# Patient Record
Sex: Male | Born: 1945 | Race: White | Hispanic: No | Smoking: Never smoker
Health system: Southern US, Community
[De-identification: ages and names within clinical notes are randomized; demographics above are authoritative.]

## PROBLEM LIST (undated history)

## (undated) DIAGNOSIS — I10 Essential (primary) hypertension: Secondary | ICD-10-CM

## (undated) DIAGNOSIS — E119 Type 2 diabetes mellitus without complications: Secondary | ICD-10-CM

## (undated) DIAGNOSIS — E785 Hyperlipidemia, unspecified: Secondary | ICD-10-CM

## (undated) HISTORY — PX: FRACTURE SURGERY: SHX138

## (undated) HISTORY — DX: Type 2 diabetes mellitus without complications: E11.9

## (undated) HISTORY — DX: Essential (primary) hypertension: I10

## (undated) HISTORY — DX: Hyperlipidemia, unspecified: E78.5

---

## 2003-07-23 ENCOUNTER — Ambulatory Visit (HOSPITAL_COMMUNITY): Admission: RE | Admit: 2003-07-23 | Discharge: 2003-07-23 | Payer: Self-pay | Admitting: *Deleted

## 2004-10-26 ENCOUNTER — Emergency Department (HOSPITAL_COMMUNITY): Admission: EM | Admit: 2004-10-26 | Discharge: 2004-10-26 | Payer: Self-pay | Admitting: Emergency Medicine

## 2011-09-12 ENCOUNTER — Ambulatory Visit (INDEPENDENT_AMBULATORY_CARE_PROVIDER_SITE_OTHER): Payer: Managed Care, Other (non HMO)

## 2011-09-12 DIAGNOSIS — I10 Essential (primary) hypertension: Secondary | ICD-10-CM

## 2011-09-12 DIAGNOSIS — Z23 Encounter for immunization: Secondary | ICD-10-CM

## 2011-09-12 DIAGNOSIS — IMO0001 Reserved for inherently not codable concepts without codable children: Secondary | ICD-10-CM

## 2011-09-12 DIAGNOSIS — E789 Disorder of lipoprotein metabolism, unspecified: Secondary | ICD-10-CM

## 2011-11-30 ENCOUNTER — Telehealth: Payer: Self-pay

## 2011-11-30 NOTE — Telephone Encounter (Signed)
[facility] requesting pt rx refill on caduet 10/80 Dr. Alwyn Ren prescribed.

## 2011-11-30 NOTE — Telephone Encounter (Signed)
[facility] refill of caduet 10/80 Dr Alwyn Ren prescribed.

## 2011-12-01 ENCOUNTER — Other Ambulatory Visit: Payer: Self-pay

## 2011-12-01 MED ORDER — AMLODIPINE-ATORVASTATIN 10-80 MG PO TABS: 1.0000 | ORAL_TABLET | Freq: Every day | ORAL | Status: DC

## 2011-12-01 NOTE — Telephone Encounter (Signed)
When pt calls back please ask which pharmacy he would like the caduet called into. Chart at TL box

## 2012-01-04 ENCOUNTER — Ambulatory Visit (INDEPENDENT_AMBULATORY_CARE_PROVIDER_SITE_OTHER): Payer: Managed Care, Other (non HMO) | Admitting: Family Medicine

## 2012-01-04 VITALS — BP 130/72 | HR 82 | Temp 98.1°F | Resp 16 | Ht 66.0 in | Wt 214.2 lb

## 2012-01-04 DIAGNOSIS — E785 Hyperlipidemia, unspecified: Secondary | ICD-10-CM

## 2012-01-04 DIAGNOSIS — E119 Type 2 diabetes mellitus without complications: Secondary | ICD-10-CM

## 2012-01-04 DIAGNOSIS — E789 Disorder of lipoprotein metabolism, unspecified: Secondary | ICD-10-CM

## 2012-01-04 DIAGNOSIS — E1165 Type 2 diabetes mellitus with hyperglycemia: Secondary | ICD-10-CM

## 2012-01-04 DIAGNOSIS — I10 Essential (primary) hypertension: Secondary | ICD-10-CM

## 2012-01-04 LAB — GLUCOSE, POCT (MANUAL RESULT ENTRY): POC Glucose: 77

## 2012-01-04 MED ORDER — AMLODIPINE-ATORVASTATIN 10-80 MG PO TABS: 1.0000 | ORAL_TABLET | Freq: Every day | ORAL | Status: DC

## 2012-01-04 MED ORDER — LOSARTAN POTASSIUM-HCTZ 100-12.5 MG PO TABS: 1.0000 | ORAL_TABLET | Freq: Every day | ORAL | Status: DC

## 2012-01-04 MED ORDER — SITAGLIPTIN PHOSPHATE 100 MG PO TABS: 100.0000 mg | ORAL_TABLET | Freq: Every day | ORAL | Status: DC

## 2012-01-04 MED ORDER — METFORMIN HCL 1000 MG PO TABS: 1000.0000 mg | ORAL_TABLET | Freq: Two times a day (BID) | ORAL | Status: DC

## 2012-01-04 MED ORDER — GLIPIZIDE 5 MG PO TABS: 5.0000 mg | ORAL_TABLET | Freq: Two times a day (BID) | ORAL | Status: DC

## 2012-01-04 NOTE — Progress Notes (Signed)
  Subjective:    Patient ID: Derrick Becker, male    DOB: 1946/03/03, 66 y.o.   MRN: [id number]  HPI Derrick Becker is a 66 y.o. male Here for f/u DM2, htn, hyperlipidemia.  DM2 - last A1c 7.6 09/12/11.  Home CBG's - 140's in am, lowest =79 1 month ago, not checking blood sugars recently.  Highest -170-180. symptomatic lows once in awhile in past.     Takes metformin BID, and glipizide BID., Januvia  optho - Dr. Dione Booze, last eval November 2012 - told was ok.    HTN- outside BP's 127/70.  No side effects with meds.  Hyperlipidemia - LDL 143, HDL 30, 02/19/11.  Takes Caduet 10/80 QD.  Missed last 2 days.  Eating/drinking ok.,  No new side effects of meds.  Last  po 7 hours ago.   Review of Systems  Constitutional: Negative for fatigue and unexpected weight change.  Eyes: Negative for visual disturbance.  Respiratory: Negative for cough, chest tightness and shortness of breath.   Cardiovascular: Negative for chest pain, palpitations and leg swelling.  Gastrointestinal: Negative for abdominal pain and blood in stool.  Neurological: Negative for dizziness, light-headedness and headaches.       Objective:   Physical Exam  Constitutional: He is oriented to person, place, and time. He appears well-developed and well-nourished.  HENT:  Head: Normocephalic and atraumatic.  Eyes: Pupils are equal, round, and reactive to light.  Cardiovascular: Normal rate, regular rhythm, normal heart sounds and intact distal pulses.   Pulmonary/Chest: Effort normal and breath sounds normal.  Abdominal: Soft. There is no tenderness.  Neurological: He is alert and oriented to person, place, and time.       Microfilament testing of feet normal bilaterally.  Skin: Skin is warm, dry and intact. No rash noted.  Psychiatric: He has a normal mood and affect. His behavior is normal.   . Results for orders placed in visit on 01/04/12  GLUCOSE, POCT (MANUAL RESULT ENTRY)      Component Value Range   POC Glucose  77    POCT GLYCOSYLATED HEMOGLOBIN (HGB A1C)      Component Value Range   Hemoglobin A1C 7.1           Assessment & Plan:  Derrick Becker is a 66 y.o. male 1. HTN (hypertension)  Comprehensive metabolic panel  2. Hyperlipidemia  Lipid panel, Comprehensive metabolic panel  3. Diabetes type 2, uncontrolled  POCT glucose (manual entry), Comprehensive metabolic panel, POCT glycosylated hemoglobin (Hb A1C)   Borderline control of DM, but suspect up and down readings with possible hypoglycemic episodes. Decrease glipizide to 5mg  BID with meals, discussed carrying sugar  [employer], or otherwise) if any hypoglycemic sx's. Increase metformin to 1 gram BID. Cont januvia at same dose.  If home cbg's remain below 100, o above 200 - rtc , o/w 3 month follow up.  Understanding expressed of med changes and precautions. HTN - controlled. Discussed adequate fluid intake with hx elevated creatinine. Check CMP, lipids.  Cont same dose of chol meds.  Recheck 3 months.

## 2012-01-05 LAB — COMPREHENSIVE METABOLIC PANEL
ALT: 16 U/L (ref 0–53)
AST: 17 U/L (ref 0–37)
Albumin: 4.6 g/dL (ref 3.5–5.2)
Alkaline Phosphatase: 59 U/L (ref 39–117)
Glucose, Bld: 78 mg/dL (ref 70–99)
Potassium: 4.5 mEq/L (ref 3.5–5.3)
Sodium: 141 mEq/L (ref 135–145)
Total Protein: 7.4 g/dL (ref 6.0–8.3)

## 2012-01-05 LAB — LIPID PANEL
Cholesterol: 182 mg/dL (ref 0–200)
LDL Cholesterol: 118 mg/dL — ABNORMAL HIGH (ref 0–99)
VLDL: 22 mg/dL (ref 0–40)

## 2012-07-20 ENCOUNTER — Other Ambulatory Visit: Payer: Self-pay | Admitting: Family Medicine

## 2012-08-23 ENCOUNTER — Ambulatory Visit (INDEPENDENT_AMBULATORY_CARE_PROVIDER_SITE_OTHER): Payer: Managed Care, Other (non HMO) | Admitting: Internal Medicine

## 2012-08-23 VITALS — BP 122/64 | HR 74 | Temp 98.6°F | Resp 16 | Ht 65.5 in | Wt 206.4 lb

## 2012-08-23 DIAGNOSIS — Z23 Encounter for immunization: Secondary | ICD-10-CM

## 2012-08-23 DIAGNOSIS — Z7189 Other specified counseling: Secondary | ICD-10-CM

## 2012-08-23 DIAGNOSIS — E119 Type 2 diabetes mellitus without complications: Secondary | ICD-10-CM | POA: Insufficient documentation

## 2012-08-23 DIAGNOSIS — I10 Essential (primary) hypertension: Secondary | ICD-10-CM

## 2012-08-23 LAB — POCT CBC
Granulocyte percent: 66.2 %G (ref 37–80)
HCT, POC: 42.9 % — AB (ref 43.5–53.7)
Hemoglobin: 13.2 g/dL — AB (ref 14.1–18.1)
MCV: 98.1 fL — AB (ref 80–97)
POC Granulocyte: 5 (ref 2–6.9)
POC LYMPH PERCENT: 25.3 %L (ref 10–50)
RBC: 4.37 M/uL — AB (ref 4.69–6.13)
RDW, POC: 14 %

## 2012-08-23 LAB — GLUCOSE, POCT (MANUAL RESULT ENTRY): POC Glucose: 122 mg/dl — AB (ref 70–99)

## 2012-08-23 LAB — LIPID PANEL
Cholesterol: 150 mg/dL (ref 0–200)
HDL: 36 mg/dL — ABNORMAL LOW (ref >39)

## 2012-08-23 LAB — COMPREHENSIVE METABOLIC PANEL
AST: 16 U/L (ref 0–37)
Albumin: 4.6 g/dL (ref 3.5–5.2)
BUN: 15 mg/dL (ref 6–23)
CO2: 26 mEq/L (ref 19–32)
Calcium: 9.9 mg/dL (ref 8.4–10.5)
Chloride: 102 mEq/L (ref 96–112)
Potassium: 4.8 mEq/L (ref 3.5–5.3)

## 2012-08-23 LAB — POCT URINALYSIS DIPSTICK
Bilirubin, UA: NEGATIVE
Blood, UA: NEGATIVE
Ketones, UA: NEGATIVE
Leukocytes, UA: NEGATIVE
Spec Grav, UA: 1.015
pH, UA: 5

## 2012-08-23 MED ORDER — METFORMIN HCL 1000 MG PO TABS: 1000.0000 mg | ORAL_TABLET | Freq: Two times a day (BID) | ORAL | Status: DC

## 2012-08-23 MED ORDER — LOSARTAN POTASSIUM-HCTZ 100-25 MG PO TABS: 1.0000 | ORAL_TABLET | Freq: Every day | ORAL | Status: DC

## 2012-08-23 MED ORDER — GLIPIZIDE 10 MG PO TABS: 10.0000 mg | ORAL_TABLET | Freq: Two times a day (BID) | ORAL | Status: DC

## 2012-08-23 MED ORDER — AMLODIPINE-ATORVASTATIN 10-80 MG PO TABS: 1.0000 | ORAL_TABLET | Freq: Every day | ORAL | Status: DC

## 2012-08-23 NOTE — Patient Instructions (Addendum)
Diets for Diabetes, Food Labeling  Look at food labels to help you decide how much of a product you can eat. You will want to check the amount of total carbohydrate in a serving to see how the food fits into your meal plan. In the list of ingredients, the ingredient present in the largest amount by weight must be listed first, followed by the other ingredients in descending order. STANDARD OF IDENTITY Most products have a list of ingredients. However, foods that the Food and Drug Administration (FDA) has given a standard of identity do not need a list of ingredients. A standard of identity means that a food must contain certain ingredients if it is called a particular name. Examples are mayonnaise, peanut butter, ketchup, jelly, and cheese. LABELING TERMS There are many terms found on food labels. Some of these terms have specific definitions. Some terms are regulated by the FDA, and the FDA has clearly specified how they can be used. Others are not regulated or well-defined and can be misleading and confusing. SPECIFICALLY DEFINED TERMS Nutritive Sweetener.  A sweetener that contains calories,such as table sugar or honey. Nonnutritive Sweetener.  A sweetener with few or no calories,such as saccharin, aspartame, sucralose, and cyclamate. LABELING TERMS REGULATED BY THE FDA Free.  The product contains only a tiny or small amount of fat, cholesterol, sodium, sugar, or calories. For example, a "fat-free" product will contain less than 0.5 g of fat per serving. Low.  A food described as "low" in fat, saturated fat, cholesterol, sodium, or calories could be eaten fairly often without exceeding dietary guidelines. For example, "low in fat" means no more than 3 g of fat per serving. Lean.  "Lean" and "extra lean" are U.S. Department of Agriculture Architect) terms for use on meat and poultry products. "Lean" means the product contains less than 10 g of fat, 4 g of saturated fat, and 95 mg of cholesterol  per serving. "Lean" is not as low in fat as a product labeled "low." Extra Lean.  "Extra lean" means the product contains less than 5 g of fat, 2 g of saturated fat, and 95 mg of cholesterol per serving. While "extra lean" has less fat than "lean," it is still higher in fat than a product labeled "low." Reduced, Less, Fewer.  A diet product that contains 25% less of a nutrient or calories than the regular version. For example, hot dogs might be labeled "25% less fat than our regular hot dogs." Light/Lite.  A diet product that contains  fewer calories or  the fat of the original. For example, "light in sodium" means a product with  the usual sodium. More.  One serving contains at least 10% more of the daily value of a vitamin, mineral, or fiber than usual. Good Source Of.  One serving contains 10% to 19% of the daily value for a particular vitamin, mineral, or fiber. Excellent Source Of.  One serving contains 20% or more of the daily value for a particular nutrient. Other terms used might be "high in" or "rich in." Enriched or Fortified.  The product contains added vitamins, minerals, or protein. Nutrition labeling must be used on enriched or fortified foods. Imitation.  The product has been altered so that it is lower in protein, vitamins, or minerals than the usual food,such as imitation peanut butter. Total Fat.  The number listed is the total of all fat found in a serving of the product. Under total fat, food labels must list saturated fat  and trans fat, which are associated with raising bad cholesterol and an increased risk of heart blood vessel disease. Saturated Fat.  Mainly fats from animal-based sources. Some examples are red meat, cheese, cream, whole milk, and coconut oil. Trans Fat.  Found in some fried snack foods, packaged foods, and fried restaurant foods. It is recommended you eat as close to 0 g of trans fat as possible, since it raises bad cholesterol and lowers  good cholesterol. Polyunsaturated and Monounsaturated Fats.  More healthful fats. These fats are from plant sources. Total Carbohydrate.  The number of carbohydrate grams in a serving of the product. Under total carbohydrate are listed the other carbohydrate sources, such as dietary fiber and sugars. Dietary Fiber.  A carbohydrate from plant sources. Sugars.  Sugars listed on the label contain all naturally occurring sugars as well as added sugars. LABELING TERMS NOT REGULATED BY THE FDA Sugarless.  Table sugar (sucrose) has not been added. However, the manufacturer may use another form of sugar in place of sucrose to sweeten the product. For example, sugar alcohols are used to sweeten foods. Sugar alcohols are a form of sugar but are not table sugar. If a product contains sugar alcohols in place of sucrose, it can still be labeled "sugarless." Low Salt, Salt-Free, Unsalted, No Salt, No Salt Added, Without Added Salt.  Food that is usually processed with salt has been made without salt. However, the food may contain sodium-containing additives, such as preservatives, leavening agents, or flavorings. Natural.  This term has no legal meaning. Organic.  Foods that are certified as organic have been inspected and approved by the USDA to ensure they are produced without pesticides, fertilizers containing synthetic ingredients, bioengineering, or ionizing radiation. Document Released: 09/24/2003 Document Revised: 12/14/2011 Document Reviewed: 04/11/2009 [facility] Patient Information 2013 [place], [place].  Hypoglycemia (Low Blood Sugar)  Hypoglycemia is when the glucose (sugar) in your blood is too low. Hypoglycemia can happen for many reasons. It can happen to people with or without diabetes. Hypoglycemia can develop quickly and can be a medical emergency.  CAUSES  Having hypoglycemia does not mean that you will develop diabetes. Different causes include:  Missed or delayed meals or not  enough carbohydrates eaten.  Medication overdose. This could be by accident or deliberate. If by accident, your medication may need to be adjusted or changed.  Exercise or increased activity without adjustments in carbohydrates or medications.  A nerve disorder that affects body functions like your heart rate, blood pressure and digestion (autonomic neuropathy).  A condition where the stomach muscles do not function properly (gastroparesis). Therefore, medications may not absorb properly.  The inability to recognize the signs of hypoglycemia (hypoglycemic unawareness).  Absorption of insulin  may be altered.  Alcohol consumption.  Pregnancy/menstrual cycles/postpartum. This may be due to hormones.  Certain kinds of tumors. This is very rare. SYMPTOMS   Sweating.  Hunger.  Dizziness.  Blurred vision.  Drowsiness.  Weakness.  Headache.  Rapid heart beat.  Shakiness.  Nervousness. DIAGNOSIS  Diagnosis is made by monitoring blood glucose in one or all of the following ways:  Fingerstick blood glucose monitoring.  Laboratory results. TREATMENT  If you think your blood glucose is low:  Check your blood glucose, if possible. If it is less than 70 mg/dl, take one of the following:  3-4 glucose tablets.   cup juice (prefer clear like apple).   cup "regular" soda pop.  1 cup milk.  -1 tube of glucose gel.  5-6 hard candies.  Do not over treat because your blood glucose (sugar) will only go too high.  Wait 15 minutes and recheck your blood glucose. If it is still less than 70 mg/dl (or below your target range), repeat treatment.  Eat a snack if it is more than one hour until your next meal. Sometimes, your blood glucose may go so low that you are unable to treat yourself. You may need someone to help you. You may even pass out or be unable to swallow. This may require you to get an injection of glucagon, which raises the blood glucose. HOME CARE  INSTRUCTIONS  Check blood glucose as recommended by your caregiver.  Take medication as prescribed by your caregiver.  Follow your meal plan. Do not skip meals. Eat on time.  If you are going to drink alcohol, drink it only with meals.  Check your blood glucose before driving.  Check your blood glucose before and after exercise. If you exercise longer or different than usual, be sure to check blood glucose more frequently.  Always carry treatment with you. Glucose tablets are the easiest to carry.  Always wear medical alert jewelry or carry some form of identification that states that you have diabetes. This will alert people that you have diabetes. If you have hypoglycemia, they will have a better idea on what to do. SEEK MEDICAL CARE IF:   You are having problems keeping your blood sugar at target range.  You are having frequent episodes of hypoglycemia.  You feel you might be having side effects from your medicines.  You have symptoms of an illness that is not improving after 3-4 days.  You notice a change in vision or a new problem with your vision. SEEK IMMEDIATE MEDICAL CARE IF:   You are a family member or friend of a person whose blood glucose goes below 70 mg/dl and is accompanied by:  Confusion.  A change in mental status.  The inability to swallow.  Passing out. Document Released: 09/21/2005 Document Revised: 12/14/2011 Document Reviewed: 01/18/2012 [facility] Patient Information 2013 [place], [place].

## 2012-08-23 NOTE — Progress Notes (Signed)
  Subjective:    Patient ID: Derrick Becker, male    DOB: 1945/11/12, 66 y.o.   MRN: [id number]  HPI T2D, NIDDM controlled HTN controlled    Review of Systems     Objective:   Physical Exam Sensory intact COR nl Results for orders placed in visit on 08/23/12  POCT CBC      Component Value Range   WBC 7.6  4.6 - 10.2 K/uL   Lymph, poc 1.9  0.6 - 3.4   POC LYMPH PERCENT 25.3  10 - 50 %L   MID (cbc) 0.6  0 - 0.9   POC MID % 8.5  0 - 12 %M   POC Granulocyte 5.0  2 - 6.9   Granulocyte percent 66.2  37 - 80 %G   RBC 4.37 (*) 4.69 - 6.13 M/uL   Hemoglobin 13.2 (*) 14.1 - 18.1 g/dL   HCT, POC 56.2 (*) 13.0 - 53.7 %   MCV 98.1 (*) 80 - 97 fL   MCH, POC 30.2  27 - 31.2 pg   MCHC 30.8 (*) 31.8 - 35.4 g/dL   RDW, POC 86.5     Platelet Count, POC 331  142 - 424 K/uL   MPV 7.1  0 - 99.8 fL  GLUCOSE, POCT (MANUAL RESULT ENTRY)      Component Value Range   POC Glucose 122 (*) 70 - 99 mg/dl  POCT GLYCOSYLATED HEMOGLOBIN (HGB A1C)      Component Value Range   Hemoglobin A1C 7.1    POCT URINALYSIS DIPSTICK      Component Value Range   Color, UA yellow     Clarity, UA clear     Glucose, UA neg     Bilirubin, UA neg     Ketones, UA neg     Spec Grav, UA 1.015     Blood, UA neg     pH, UA 5.0     Protein, UA neg     Urobilinogen, UA 0.2     Nitrite, UA neg     Leukocytes, UA Negative           Assessment & Plan:  Do cpe with Dr. Alwyn Ren next month RF meds 1 yr

## 2013-08-30 ENCOUNTER — Other Ambulatory Visit: Payer: Self-pay | Admitting: Internal Medicine

## 2013-09-08 ENCOUNTER — Other Ambulatory Visit: Payer: Self-pay | Admitting: Internal Medicine

## 2013-10-12 ENCOUNTER — Ambulatory Visit (INDEPENDENT_AMBULATORY_CARE_PROVIDER_SITE_OTHER): Payer: Managed Care, Other (non HMO) | Admitting: Family Medicine

## 2013-10-12 VITALS — BP 122/76 | HR 85 | Temp 98.6°F | Resp 17 | Ht 66.0 in | Wt 211.0 lb

## 2013-10-12 DIAGNOSIS — E785 Hyperlipidemia, unspecified: Secondary | ICD-10-CM

## 2013-10-12 DIAGNOSIS — I10 Essential (primary) hypertension: Secondary | ICD-10-CM

## 2013-10-12 DIAGNOSIS — E119 Type 2 diabetes mellitus without complications: Secondary | ICD-10-CM

## 2013-10-12 LAB — LIPID PANEL
Cholesterol: 135 mg/dL (ref 0–200)
HDL: 31 mg/dL — ABNORMAL LOW (ref >39)
LDL CALC: 92 mg/dL (ref 0–99)
Total CHOL/HDL Ratio: 4.4 Ratio
Triglycerides: 60 mg/dL (ref <150)
VLDL: 12 mg/dL (ref 0–40)

## 2013-10-12 LAB — COMPREHENSIVE METABOLIC PANEL
ALT: 16 U/L (ref 0–53)
AST: 20 U/L (ref 0–37)
Albumin: 4.3 g/dL (ref 3.5–5.2)
Alkaline Phosphatase: 60 U/L (ref 39–117)
BUN: 17 mg/dL (ref 6–23)
CALCIUM: 9.5 mg/dL (ref 8.4–10.5)
CHLORIDE: 102 meq/L (ref 96–112)
CO2: 25 mEq/L (ref 19–32)
Creat: 0.89 mg/dL (ref 0.50–1.35)
Glucose, Bld: 148 mg/dL — ABNORMAL HIGH (ref 70–99)
POTASSIUM: 4.4 meq/L (ref 3.5–5.3)
SODIUM: 138 meq/L (ref 135–145)
TOTAL PROTEIN: 6.9 g/dL (ref 6.0–8.3)
Total Bilirubin: 0.4 mg/dL (ref 0.3–1.2)

## 2013-10-12 LAB — POCT GLYCOSYLATED HEMOGLOBIN (HGB A1C): Hemoglobin A1C: 8.1

## 2013-10-12 MED ORDER — METFORMIN HCL 1000 MG PO TABS: ORAL_TABLET | ORAL | Status: DC

## 2013-10-12 MED ORDER — LOSARTAN POTASSIUM-HCTZ 100-25 MG PO TABS: ORAL_TABLET | ORAL | Status: DC

## 2013-10-12 MED ORDER — AMLODIPINE-ATORVASTATIN 10-80 MG PO TABS: ORAL_TABLET | ORAL | Status: DC

## 2013-10-12 MED ORDER — GLIPIZIDE 10 MG PO TABS: ORAL_TABLET | ORAL | Status: DC

## 2013-10-12 NOTE — Patient Instructions (Signed)
Continue current medications  Get regular exercise  Decrease your intake of starches, food such as breads, pastas, potatoes, rice, etc.  Try to fill up on fruits and vegetables and lean meats  Return in about June at which time I want you to call and find out when I will be here in a morning schedule. Come in early without eating, and we will do your physical and recheck some other labs then.  [place] no the rest of your lab work in a few days

## 2013-10-12 NOTE — Progress Notes (Signed)
Subjective:  Patient is here for a regular visit. It is been a long time since he is being here. He has no major new complaints. He is out of his medications in the next day or so. He continues to work regularly. He saw his eye doctor and everything was good. He has no HEENT complaints. No cardiorespiratory complaints. No GI complaints.  Objective Overweight male in no major distress. TMs normal. Throat clear. Neck supple without nodes thyromegaly. No carotid bruits. Chest clear. Heart regular without murmurs. And soft the mass or tenderness. Extremities without edema.  Assessment: Diabetes, unsatisfactory control Hypertension Hyperlipidemia  Plan: Watches diabetic diet. Get back on his medicines regularly. Return early summer, about June, for a physical examination.

## 2013-10-17 ENCOUNTER — Encounter: Payer: Self-pay | Admitting: Family Medicine

## 2014-10-02 ENCOUNTER — Other Ambulatory Visit: Payer: Self-pay | Admitting: Family Medicine

## 2014-10-17 LAB — HM DIABETES EYE EXAM

## 2014-10-18 ENCOUNTER — Other Ambulatory Visit: Payer: Self-pay | Admitting: Physician Assistant

## 2014-10-21 ENCOUNTER — Other Ambulatory Visit: Payer: Self-pay | Admitting: Family Medicine

## 2014-11-02 ENCOUNTER — Encounter: Payer: Self-pay | Admitting: Family Medicine

## 2014-12-22 ENCOUNTER — Other Ambulatory Visit: Payer: Self-pay | Admitting: Family Medicine

## 2014-12-22 ENCOUNTER — Ambulatory Visit (INDEPENDENT_AMBULATORY_CARE_PROVIDER_SITE_OTHER): Payer: Managed Care, Other (non HMO) | Admitting: Family Medicine

## 2014-12-22 VITALS — BP 162/88 | HR 82 | Temp 98.3°F | Resp 16 | Ht 66.5 in | Wt 209.2 lb

## 2014-12-22 DIAGNOSIS — E119 Type 2 diabetes mellitus without complications: Secondary | ICD-10-CM

## 2014-12-22 DIAGNOSIS — E785 Hyperlipidemia, unspecified: Secondary | ICD-10-CM | POA: Diagnosis not present

## 2014-12-22 DIAGNOSIS — I10 Essential (primary) hypertension: Secondary | ICD-10-CM | POA: Diagnosis not present

## 2014-12-22 LAB — LIPID PANEL
CHOLESTEROL: 221 mg/dL — AB (ref 0–200)
CHOLESTEROL: 221 mg/dL — AB (ref 0–200)
HDL: 37 mg/dL — AB (ref >=40)
HDL: 37 mg/dL — ABNORMAL LOW (ref >=40)
LDL CALC: 135 mg/dL — AB (ref 0–99)
LDL Cholesterol: 135 mg/dL — ABNORMAL HIGH (ref 0–99)
Total CHOL/HDL Ratio: 6 Ratio
Total CHOL/HDL Ratio: 6 Ratio
Triglycerides: 243 mg/dL — ABNORMAL HIGH (ref <150)
Triglycerides: 243 mg/dL — ABNORMAL HIGH (ref <150)
VLDL: 49 mg/dL — AB (ref 0–40)
VLDL: 49 mg/dL — ABNORMAL HIGH (ref 0–40)

## 2014-12-22 LAB — POCT GLYCOSYLATED HEMOGLOBIN (HGB A1C): HEMOGLOBIN A1C: 8.9

## 2014-12-22 LAB — GLUCOSE, POCT (MANUAL RESULT ENTRY): POC Glucose: 172 mg/dl — AB (ref 70–99)

## 2014-12-22 LAB — MICROALBUMIN, URINE: Microalb, Ur: 18.2 mg/dL — ABNORMAL HIGH (ref <2.0)

## 2014-12-22 MED ORDER — LOSARTAN POTASSIUM-HCTZ 100-25 MG PO TABS: ORAL_TABLET | ORAL | Status: DC

## 2014-12-22 MED ORDER — METFORMIN HCL 1000 MG PO TABS: ORAL_TABLET | ORAL | Status: DC

## 2014-12-22 MED ORDER — AMLODIPINE-ATORVASTATIN 10-80 MG PO TABS
1.0000 | ORAL_TABLET | Freq: Every day | ORAL | Status: DC
Start: 2014-12-22 — End: 2015-05-30

## 2014-12-22 MED ORDER — GLIPIZIDE 10 MG PO TABS: ORAL_TABLET | ORAL | Status: DC

## 2014-12-22 NOTE — Patient Instructions (Signed)
Continue current medications. It is very important that you take them faithfully.  Return in mid to late June for a follow-up check. Please call and find out what they'll be in the office and try and come in one day that I am here so I can recheck you. Ask for me when you sign in.   Return anytime if needed  Work hard on trying to get regular exercise and to eat less  Recommend reading as much as you can on the American Diabetic Association website

## 2014-12-22 NOTE — Progress Notes (Addendum)
Subjective: Patient was here for a refill of his medications. I failed to dictate noted that time, and do not recall of any other major complaints.  Objective: Normal exam. Overweight male.  Assessment: Diabetes Hypertension    Results for orders placed or performed in visit on 12/22/14  POCT glucose (manual entry)  Result Value Ref Range   POC Glucose 172 (A) 70 - 99 mg/dl  POCT glycosylated hemoglobin (Hb A1C)  Result Value Ref Range   Hemoglobin A1C 8.9    Plan: See instructions

## 2015-05-13 ENCOUNTER — Encounter: Payer: Self-pay | Admitting: *Deleted

## 2015-05-30 ENCOUNTER — Ambulatory Visit (INDEPENDENT_AMBULATORY_CARE_PROVIDER_SITE_OTHER): Payer: Managed Care, Other (non HMO) | Admitting: Family Medicine

## 2015-05-30 VITALS — BP 130/70 | HR 74 | Temp 97.8°F | Resp 16 | Ht 66.0 in | Wt 205.0 lb

## 2015-05-30 DIAGNOSIS — E119 Type 2 diabetes mellitus without complications: Secondary | ICD-10-CM | POA: Diagnosis not present

## 2015-05-30 DIAGNOSIS — G4700 Insomnia, unspecified: Secondary | ICD-10-CM

## 2015-05-30 DIAGNOSIS — R0683 Snoring: Secondary | ICD-10-CM | POA: Diagnosis not present

## 2015-05-30 DIAGNOSIS — E785 Hyperlipidemia, unspecified: Secondary | ICD-10-CM

## 2015-05-30 DIAGNOSIS — I10 Essential (primary) hypertension: Secondary | ICD-10-CM

## 2015-05-30 LAB — LIPID PANEL
CHOL/HDL RATIO: 3.1 ratio (ref <=5.0)
Cholesterol: 118 mg/dL — ABNORMAL LOW (ref 125–200)
HDL: 38 mg/dL — AB (ref >=40)
LDL CALC: 70 mg/dL (ref <130)
Triglycerides: 52 mg/dL (ref <150)
VLDL: 10 mg/dL (ref <30)

## 2015-05-30 LAB — GLUCOSE, POCT (MANUAL RESULT ENTRY): POC GLUCOSE: 122 mg/dL — AB (ref 70–99)

## 2015-05-30 LAB — COMPREHENSIVE METABOLIC PANEL
ALBUMIN: 4.5 g/dL (ref 3.6–5.1)
ALT: 15 U/L (ref 9–46)
AST: 17 U/L (ref 10–35)
Alkaline Phosphatase: 54 U/L (ref 40–115)
BUN: 16 mg/dL (ref 7–25)
CHLORIDE: 101 mmol/L (ref 98–110)
CO2: 24 mmol/L (ref 20–31)
CREATININE: 0.92 mg/dL (ref 0.70–1.25)
Calcium: 9.7 mg/dL (ref 8.6–10.3)
Glucose, Bld: 93 mg/dL (ref 65–99)
Potassium: 4.4 mmol/L (ref 3.5–5.3)
SODIUM: 138 mmol/L (ref 135–146)
Total Bilirubin: 0.4 mg/dL (ref 0.2–1.2)
Total Protein: 7 g/dL (ref 6.1–8.1)

## 2015-05-30 LAB — POCT GLYCOSYLATED HEMOGLOBIN (HGB A1C): HEMOGLOBIN A1C: 7.9

## 2015-05-30 MED ORDER — GLIPIZIDE 10 MG PO TABS: ORAL_TABLET | ORAL | Status: DC

## 2015-05-30 MED ORDER — AMLODIPINE-ATORVASTATIN 10-80 MG PO TABS: 1.0000 | ORAL_TABLET | Freq: Every day | ORAL | Status: DC

## 2015-05-30 MED ORDER — LOSARTAN POTASSIUM-HCTZ 100-25 MG PO TABS
ORAL_TABLET | ORAL | Status: DC
Start: 2015-05-30 — End: 2015-11-30

## 2015-05-30 MED ORDER — METFORMIN HCL 1000 MG PO TABS: ORAL_TABLET | ORAL | Status: DC

## 2015-05-30 NOTE — Patient Instructions (Addendum)
Insomnia Insomnia is frequent trouble falling and/or staying asleep. Insomnia can be a long term problem or a short term problem. Both are common. Insomnia can be a short term problem when the wakefulness is related to a certain stress or worry. Long term insomnia is often related to ongoing stress during waking hours and/or poor sleeping habits. Overtime, sleep deprivation itself can make the problem worse. Every little thing feels more severe because you are overtired and your ability to cope is decreased. CAUSES   Stress, anxiety, and depression.  Poor sleeping habits.  Distractions such as TV in the bedroom.  Naps close to bedtime.  Engaging in emotionally charged conversations before bed.  Technical reading before sleep.  Alcohol and other sedatives. They may make the problem worse. They can hurt normal sleep patterns and normal dream activity.  Stimulants such as caffeine for several hours prior to bedtime.  Pain syndromes and shortness of breath can cause insomnia.  Exercise late at night.  Changing time zones may cause sleeping problems (jet lag). It is sometimes helpful to have someone observe your sleeping patterns. They should look for periods of not breathing during the night (sleep apnea). They should also look to see how long those periods last. If you live alone or observers are uncertain, you can also be observed at a sleep clinic where your sleep patterns will be professionally monitored. Sleep apnea requires a checkup and treatment. Give your caregivers your medical history. Give your caregivers observations your family has made about your sleep.  SYMPTOMS   Not feeling rested in the morning.  Anxiety and restlessness at bedtime.  Difficulty falling and staying asleep. TREATMENT   Your caregiver may prescribe treatment for an underlying medical disorders. Your caregiver can give advice or help if you are using alcohol or other drugs for self-medication. Treatment  of underlying problems will usually eliminate insomnia problems.  Medications can be prescribed for short time use. They are generally not recommended for lengthy use.  Over-the-counter sleep medicines are not recommended for lengthy use. They can be habit forming.  You can promote easier sleeping by making lifestyle changes such as:  Using relaxation techniques that help with breathing and reduce muscle tension.  Exercising earlier in the day.  Changing your diet and the time of your last meal. No night time snacks.  Establish a regular time to go to bed.  Counseling can help with stressful problems and worry.  Soothing music and white noise may be helpful if there are background noises you cannot remove.  Stop tedious detailed work at least one hour before bedtime. HOME CARE INSTRUCTIONS   Keep a diary. Inform your caregiver about your progress. This includes any medication side effects. See your caregiver regularly. Take note of:  Times when you are asleep.  Times when you are awake during the night.  The quality of your sleep.  How you feel the next day. This information will help your caregiver care for you.  Get out of bed if you are still awake after 15 minutes. Read or do some quiet activity. Keep the lights down. Wait until you feel sleepy and go back to bed.  Keep regular sleeping and waking hours. Avoid naps.  Exercise regularly.  Avoid distractions at bedtime. Distractions include watching television or engaging in any intense or detailed activity like attempting to balance the household checkbook.  Develop a bedtime ritual. Keep a familiar routine of bathing, brushing your teeth, climbing into bed at the same   time each night, listening to soothing music. Routines increase the success of falling to sleep faster.  Use relaxation techniques. This can be using breathing and muscle tension release routines. It can also include visualizing peaceful scenes. You can  also help control troubling or intruding thoughts by keeping your mind occupied with boring or repetitive thoughts like the old concept of counting sheep. You can make it more creative like imagining planting one beautiful flower after another in your backyard garden.  During your day, work to eliminate stress. When this is not possible use some of the previous suggestions to help reduce the anxiety that accompanies stressful situations. MAKE SURE YOU:   Understand these instructions.  Will watch your condition.  Will get help right away if you are not doing well or get worse. Document Released: 09/18/2000 Document Revised: 12/14/2011 Document Reviewed: 10/19/2007 [facility] Patient Information 2015 [place], [place]. This information is not intended to replace advice given to you by your health care provider. Make sure you discuss any questions you have with your health care provider.   Continue current medications  Get regular exercise and try to eat less  We will plan to do a full physical next visit, around December or January. Come in one morning before you have eaten anything  You will be contacted with regard to the sleep study.  Get your flu shot at work as planned

## 2015-05-30 NOTE — Progress Notes (Signed)
Hypertension, hyperlipidemia, diabetes Subjective:  Patient ID: Derrick Becker, male    DOB: 08-14-1946  Age: 69 y.o. MRN: [id number]  Patient is here for his regular visit. He has no major acute complaints. He has been doing some walking exercise. He gets some exercise at work. He generally feels well. Cardiovascular and respiratory and GI and GU unremarkable. He doesn't sleep as well as he used to. He tosses and turns some in the night and sometimes gets up for a while. He is undergoing a divorce which is almost complete. Incompatibility. He works running a robot.   Objective:   No major distress. Overweight but he has lost few pounds. His TMs are normal. Throat clear. Neck supple without nodes. Chest clear. Heart regular without murmurs. Abdomen soft. Extremities without edema.  Assessment & Plan:   Assessment:  Diabetes type 2 Hypertension Hyperlipidemia Insomnia  Plan:  Check labs including A1c lipids, see met, glucose He will need refills on his medication  Results for orders placed or performed in visit on 05/30/15  POCT glucose (manual entry)  Result Value Ref Range   POC Glucose 122 (A) 70 - 99 mg/dl  POCT glycosylated hemoglobin (Hb A1C)  Result Value Ref Range   Hemoglobin A1C 7.9      Patient Instructions  Insomnia Insomnia is frequent trouble falling and/or staying asleep. Insomnia can be a long term problem or a short term problem. Both are common. Insomnia can be a short term problem when the wakefulness is related to a certain stress or worry. Long term insomnia is often related to ongoing stress during waking hours and/or poor sleeping habits. Overtime, sleep deprivation itself can make the problem worse. Every little thing feels more severe because you are overtired and your ability to cope is decreased. CAUSES   Stress, anxiety, and depression.  Poor sleeping habits.  Distractions such as TV in the bedroom.  Naps close to bedtime.  Engaging in  emotionally charged conversations before bed.  Technical reading before sleep.  Alcohol and other sedatives. They may make the problem worse. They can hurt normal sleep patterns and normal dream activity.  Stimulants such as caffeine for several hours prior to bedtime.  Pain syndromes and shortness of breath can cause insomnia.  Exercise late at night.  Changing time zones may cause sleeping problems (jet lag). It is sometimes helpful to have someone observe your sleeping patterns. They should look for periods of not breathing during the night (sleep apnea). They should also look to see how long those periods last. If you live alone or observers are uncertain, you can also be observed at a sleep clinic where your sleep patterns will be professionally monitored. Sleep apnea requires a checkup and treatment. Give your caregivers your medical history. Give your caregivers observations your family has made about your sleep.  SYMPTOMS   Not feeling rested in the morning.  Anxiety and restlessness at bedtime.  Difficulty falling and staying asleep. TREATMENT   Your caregiver may prescribe treatment for an underlying medical disorders. Your caregiver can give advice or help if you are using alcohol or other drugs for self-medication. Treatment of underlying problems will usually eliminate insomnia problems.  Medications can be prescribed for short time use. They are generally not recommended for lengthy use.  Over-the-counter sleep medicines are not recommended for lengthy use. They can be habit forming.  You can promote easier sleeping by making lifestyle changes such as:  Using relaxation techniques that help with breathing and  reduce muscle tension.  Exercising earlier in the day.  Changing your diet and the time of your last meal. No night time snacks.  Establish a regular time to go to bed.  Counseling can help with stressful problems and worry.  Soothing music and white noise  may be helpful if there are background noises you cannot remove.  Stop tedious detailed work at least one hour before bedtime. HOME CARE INSTRUCTIONS   Keep a diary. Inform your caregiver about your progress. This includes any medication side effects. See your caregiver regularly. Take note of:  Times when you are asleep.  Times when you are awake during the night.  The quality of your sleep.  How you feel the next day. This information will help your caregiver care for you.  Get out of bed if you are still awake after 15 minutes. Read or do some quiet activity. Keep the lights down. Wait until you feel sleepy and go back to bed.  Keep regular sleeping and waking hours. Avoid naps.  Exercise regularly.  Avoid distractions at bedtime. Distractions include watching television or engaging in any intense or detailed activity like attempting to balance the household checkbook.  Develop a bedtime ritual. Keep a familiar routine of bathing, brushing your teeth, climbing into bed at the same time each night, listening to soothing music. Routines increase the success of falling to sleep faster.  Use relaxation techniques. This can be using breathing and muscle tension release routines. It can also include visualizing peaceful scenes. You can also help control troubling or intruding thoughts by keeping your mind occupied with boring or repetitive thoughts like the old concept of counting sheep. You can make it more creative like imagining planting one beautiful flower after another in your backyard garden.  During your day, work to eliminate stress. When this is not possible use some of the previous suggestions to help reduce the anxiety that accompanies stressful situations. MAKE SURE YOU:   Understand these instructions.  Will watch your condition.  Will get help right away if you are not doing well or get worse. Document Released: 09/18/2000 Document Revised: 12/14/2011 Document  Reviewed: 10/19/2007 [facility] Patient Information 2015 [place], [place]. This information is not intended to replace advice given to you by your health care provider. Make sure you discuss any questions you have with your health care provider.   Continue current medications  Get regular exercise and try to eat less  We will plan to do a full physical next visit, around December or January. Come in one morning before you have eaten anything  You will be contacted with regard to the sleep study.  Get your flu shot at work as planned     [facility], MD 05/30/2015

## 2015-06-12 ENCOUNTER — Other Ambulatory Visit: Payer: Self-pay | Admitting: Family Medicine

## 2015-07-02 ENCOUNTER — Encounter: Payer: Self-pay | Admitting: Neurology

## 2015-07-02 ENCOUNTER — Ambulatory Visit (INDEPENDENT_AMBULATORY_CARE_PROVIDER_SITE_OTHER): Payer: Managed Care, Other (non HMO) | Admitting: Neurology

## 2015-07-02 VITALS — BP 128/62 | HR 72 | Resp 16 | Ht 66.0 in | Wt 207.0 lb

## 2015-07-02 DIAGNOSIS — E669 Obesity, unspecified: Secondary | ICD-10-CM

## 2015-07-02 DIAGNOSIS — G4719 Other hypersomnia: Secondary | ICD-10-CM | POA: Diagnosis not present

## 2015-07-02 DIAGNOSIS — R0683 Snoring: Secondary | ICD-10-CM

## 2015-07-02 NOTE — Patient Instructions (Signed)

## 2015-07-02 NOTE — Progress Notes (Signed)
Subjective:    Patient ID: Derrick Becker is a 69 y.o. male.  HPI     Star Age, MD, PhD [facility] [address] [place], [place] [address]  Dear Dr. Linna Darner,   I saw your patient, Derrick Becker, upon your kind request in my neurologic clinic today for initial consultation of his sleep disorder, in particular, concern for underlying obstructive sleep apnea. The patient is unaccompanied today. As you know, Derrick Becker is a 69 year old right-handed gentleman with an underlying medical history of type 2 diabetes, hypertension, hyperlipidemia and obesity, who reports snoring and excessive daytime somnolence with nighttime sleep disruption including difficulty maintaining sleep and difficulty going to sleep. His ESS is 16/24 and his fatigue score is 43/63. He has has at times loud snoring. Symptoms have been ongoing for months to perhaps use. He does not wake up rested. He denies morning headaches and has nocturia occasionally. He denies frank restless leg symptoms and does not know if he twitches his legs in his sleep. He lives alone. He is divorced. He has 2 grown sons. He works full-time at a [employer], [employer], 6 AM to 2:30 PM, Monday through Friday. He does not smoke. He drinks alcohol occasionally but has had heavier drinking in the past. He drinks coffee one cup per day and typically no additional caffeine, occasional sodas. Has not lost any significant amount of weight. He typically stays above the 200 pound mark and fluctuates a few pounds at a time. His brother has obstructive sleep apnea and uses a CPAP machine. This year, his son is also been diagnosed with OSA and has a CPAP machine now. His bedtime is usually around 10 or 10:30 and sometimes he has trouble going to sleep. He has never taken a sleeping pill or over-the-counter sleep aid. His rise time is typically around 4:30 AM. I reviewed your office note from 05/30/2015.  His Past Medical History  Is Significant For: Past Medical History  Diagnosis Date  . Hypertension   . Diabetes mellitus without complication   . Hyperlipemia     His Past Surgical History Is Significant For: Past Surgical History  Procedure Laterality Date  . Fracture surgery      R forearm    His Family History Is Significant For: Family History  Problem Relation Age of Onset  . Lung cancer Father   . Kidney cancer Brother     His Social History Is Significant For: Social History   Social History  . Marital Status: Divorced    Spouse Name: N/A  . Number of Children: 2  . Years of Education: [employer]   Social History Main Topics  . Smoking status: Never Smoker   . Smokeless tobacco: None  . Alcohol Use: 0.0 oz/week    0 Standard drinks or equivalent per week  . Drug Use: No  . Sexual Activity: No   Other Topics Concern  . None   Social History Narrative    His Allergies Are:  No Known Allergies:   His Current Medications Are:  Outpatient Encounter Prescriptions as of 07/02/2015  Medication Sig  . amLODipine-atorvastatin (CADUET) 10-80 MG per tablet Take 1 tablet by mouth daily.  Marland Kitchen glipiZIDE (GLUCOTROL) 10 MG tablet Take one twice daily for diabetes  . losartan-hydrochlorothiazide (HYZAAR) 100-25 MG per tablet Take one each morning for blood pressure  . metFORMIN (GLUCOPHAGE) 1000 MG tablet TAKE ONE TABLET BY MOUTH TWICE DAILY FOR DIABETES.   No facility-administered encounter  medications on file as of 07/02/2015.  :  Review of Systems:  Out of a complete 14 point review of systems, all are reviewed and negative with the exception of these symptoms as listed below:   Review of Systems  Neurological:       Has some trouble falling asleep, has trouble staying asleep, snoring, sometimes wakes up feeling tired, daytime tiredness, sleepiness, falls asleep after work when watching TV.     Objective:  Neurologic Exam  Physical Exam Physical Examination:   Filed Vitals:    07/02/15 1456  BP: 128/62  Pulse: 72  Resp: 16    General Examination: The patient is a very pleasant 69 y.o. male in no acute distress. He appears well-developed and well-nourished and well groomed. He is obese.  HEENT: Normocephalic, atraumatic, pupils are equal, round and reactive to light and accommodation. Funduscopic exam is normal with sharp disc margins noted. Extraocular tracking is good without limitation to gaze excursion or nystagmus noted. Normal smooth pursuit is noted. Hearing is grossly intact. Tympanic membranes are clear bilaterally. Face is symmetric with normal facial animation and normal facial sensation. Speech is clear with no dysarthria noted. There is no hypophonia. There is no lip, neck/head, jaw or voice tremor. Neck is supple with full range of passive and active motion. There are no carotid bruits on auscultation. Oropharynx exam reveals: mild mouth dryness, adequate dental hygiene with a partial on the top. He has moderate airway crowding based on a narrow airway entry and redundant soft palate, left side is reaching a little lower and this appears to be asymmetrical. Tonsils are absent. Uvula is small. Mallampati is class II. Tongue protrudes centrally and palate elevates symmetrically. Neck size is 17 inches. He has a Mild overbite. Nasal inspection reveals no significant nasal mucosal bogginess or redness and no septal deviation.   Chest: Clear to auscultation without wheezing, rhonchi or crackles noted.  Heart: S1+S2+0, regular and normal without murmurs, rubs or gallops noted.   Abdomen: Soft, non-tender and non-distended with normal bowel sounds appreciated on auscultation.  Extremities: There is trace pitting edema in the distal lower extremities bilaterally, around both ankles and dorsi of both feet. Pedal pulses are intact.  Skin: Warm and dry without trophic changes noted. There are no varicose veins.  Musculoskeletal: exam reveals no obvious joint  deformities, tenderness or joint swelling or erythema.   Neurologically:  Mental status: The patient is awake, alert and oriented in all 4 spheres. His immediate and remote memory, attention, language skills and fund of knowledge are appropriate. There is no evidence of aphasia, agnosia, apraxia or anomia. Speech is clear with normal prosody and enunciation. Thought process is linear. Mood is normal and affect is normal.  Cranial nerves II - XII are as described above under HEENT exam. In addition: shoulder shrug is normal with equal shoulder height noted. Motor exam: Normal bulk, strength and tone is noted. There is no drift, tremor or rebound. Romberg is negative, with the exception of mild sway. Reflexes are 2+ in the upper extremities, 1+ in both knees and trace in both ankles. Toes are flexor bilaterally. Fine motor skills and coordination: intact with normal finger taps, normal hand movements, normal rapid alternating patting, normal foot taps and normal foot agility.  Cerebellar testing: No dysmetria or intention tremor on finger to nose testing. Heel to shin is unremarkable bilaterally. There is no truncal or gait ataxia.  Sensory exam: intact to light touch, pinprick, vibration, temperature sense in the  upper and lower extremities.  Gait, station and balance: He stands easily. No veering to one side is noted. No leaning to one side is noted. Posture is age-appropriate and stance is narrow based. Gait shows normal stride length and normal pace. No problems turning are noted. He turns en bloc. Tandem walk is slightly difficult for him.   Assessment and Plan:   In summary, Derrick Becker is a very pleasant 69 y.o.-year old male with an underlying medical history of type 2 diabetes, hypertension, hyperlipidemia and obesity, who reports snoring and excessive daytime somnolence with nighttime sleep disruption including difficulty maintaining sleep and difficulty going to sleep. His history and  physical exam are indeed concerning for obstructive sleep apnea (OSA). I had a long chat with the patient about my findings and the diagnosis of OSA, its prognosis and treatment options. We talked about medical treatments, surgical interventions and non-pharmacological approaches. I explained in particular the risks and ramifications of untreated moderate to severe OSA, especially with respect to developing cardiovascular disease down the Road, including congestive heart failure, difficult to treat hypertension, cardiac arrhythmias, or stroke. Even type 2 diabetes has, in part, been linked to untreated OSA. Symptoms of untreated OSA include daytime sleepiness, memory problems, mood irritability and mood disorder such as depression and anxiety, lack of energy, as well as recurrent headaches, especially morning headaches. We talked about trying to maintain a healthy lifestyle in general, as well as the importance of weight control. I encouraged the patient to eat healthy, exercise daily and keep well hydrated, to keep a scheduled bedtime and wake time routine, to not skip any meals and eat healthy snacks in between meals. I advised the patient not to drive when feeling sleepy. I recommended the following at this time: sleep study with potential positive airway pressure titration. (We will score hypopneas at 4% and split the sleep study into diagnostic and treatment portion, if the estimated. 2 hour AHI is >20/h).   I explained the sleep test procedure to the patient and also outlined possible surgical and non-surgical treatment options of OSA, including the use of a custom-made dental device (which would require a referral to a specialist dentist or oral surgeon), upper airway surgical options, such as pillar implants, radiofrequency surgery, tongue base surgery, and UPPP (which would involve a referral to an ENT surgeon). Rarely, jaw surgery such as mandibular advancement may be considered.  I also explained the  CPAP treatment option to the patient, who indicated that he would be willing to try CPAP if the need arises. I explained the importance of being compliant with PAP treatment, not only for insurance purposes but primarily to improve His symptoms, and for the patient's long term health benefit, including to reduce His cardiovascular risks. I answered all his questions today and the patient was in agreement. I would like to see him back after the sleep study is completed and encouraged him to call with any interim questions, concerns, problems or updates.   Thank you very much for allowing me to participate in the care of this nice patient. If I can be of any further assistance to you please do not hesitate to call me at [phone].  Sincerely,   Star Age, MD, PhD

## 2015-07-25 ENCOUNTER — Other Ambulatory Visit: Payer: Self-pay

## 2015-07-25 DIAGNOSIS — E669 Obesity, unspecified: Secondary | ICD-10-CM

## 2015-07-25 DIAGNOSIS — G4719 Other hypersomnia: Secondary | ICD-10-CM

## 2015-07-25 DIAGNOSIS — R0683 Snoring: Secondary | ICD-10-CM

## 2015-08-15 ENCOUNTER — Encounter (INDEPENDENT_AMBULATORY_CARE_PROVIDER_SITE_OTHER): Payer: Managed Care, Other (non HMO) | Admitting: Neurology

## 2015-08-15 DIAGNOSIS — R0683 Snoring: Secondary | ICD-10-CM

## 2015-08-15 DIAGNOSIS — G4733 Obstructive sleep apnea (adult) (pediatric): Secondary | ICD-10-CM | POA: Diagnosis not present

## 2015-08-15 DIAGNOSIS — G4719 Other hypersomnia: Secondary | ICD-10-CM

## 2015-08-15 DIAGNOSIS — E669 Obesity, unspecified: Secondary | ICD-10-CM

## 2015-08-22 ENCOUNTER — Telehealth: Payer: Self-pay | Admitting: Neurology

## 2015-08-22 DIAGNOSIS — G4733 Obstructive sleep apnea (adult) (pediatric): Secondary | ICD-10-CM

## 2015-08-22 DIAGNOSIS — G4734 Idiopathic sleep related nonobstructive alveolar hypoventilation: Secondary | ICD-10-CM

## 2015-08-22 NOTE — Telephone Encounter (Signed)
Patient referred by Dr. Linna Darner, seen by me on 07/02/15, HST on 08/15/15, ins: [employer].  Please call and notify the patient that the recent home sleep test did suggest the diagnosis of severe obstructive sleep apnea and that I recommend treatment for this in the form of CPAP. I will request an overnight sleep study for proper titration and mask fitting. Please explain to patient and arrange for a CPAP titration study. I have placed an order in the chart. Thanks, and please route to [facility] for scheduling.   Star Age, MD, PhD [facility])

## 2015-08-26 ENCOUNTER — Telehealth: Payer: Self-pay | Admitting: Neurology

## 2015-08-26 DIAGNOSIS — G4734 Idiopathic sleep related nonobstructive alveolar hypoventilation: Secondary | ICD-10-CM

## 2015-08-26 DIAGNOSIS — G4733 Obstructive sleep apnea (adult) (pediatric): Secondary | ICD-10-CM

## 2015-08-26 NOTE — Telephone Encounter (Signed)
Derrick Becker will not cover a CPAP after doing a HST.  Auto pap is what they approve.

## 2015-08-26 NOTE — Telephone Encounter (Signed)
I left message on personal vm that study showed severe sleep apnea. I left our number to call back for further details. I stated that Dawn will call to schedule 2nd sleep study with CPAP machine. I will fax report to PCP.

## 2015-08-26 NOTE — Telephone Encounter (Signed)
I spoke to patient and he is aware of new recommendations. I will refer to Derrick Becker. I will send report to PCP. I will also send patient a letter reminding him to make f/u appt and stress importance on compliance.

## 2015-08-26 NOTE — Telephone Encounter (Signed)
Ok to order 

## 2015-08-26 NOTE — Telephone Encounter (Signed)
We can certainly start patient on autoPAP, but I would like to do an ONO about a week after autoPAP set up to monitor oxygen levels while on treatment.  He will need FU in 8-10 weeks.

## 2015-09-17 ENCOUNTER — Telehealth: Payer: Self-pay | Admitting: Neurology

## 2015-09-17 NOTE — Telephone Encounter (Signed)
Pt called sts [facility] is not in-network with [employer]. He calledCigna,it s [facility], fax [phone].Once RX rec'd they will find provider in this area

## 2015-09-18 NOTE — Telephone Encounter (Signed)
I left a message for the patient to call Derrick Becker back. I have a couple of questions about information below. [facility] should be in network unless something has changed recently. Only DME company in area that does not take [name] is [facility].

## 2015-09-18 NOTE — Telephone Encounter (Signed)
Rx, demographics, and insurance faxed into number below.

## 2015-09-18 NOTE — Telephone Encounter (Signed)
I spoke to [facility] and he was denied. I will fax Rx to number below.

## 2015-09-23 NOTE — Telephone Encounter (Signed)
I spoke to patient and advised him that we have received information that his CPAP referral was sent to [place] per insurance. He voiced understanding and I asked him to call back if he has any questions.

## 2015-10-04 ENCOUNTER — Encounter: Payer: Self-pay | Admitting: Family Medicine

## 2015-11-30 ENCOUNTER — Other Ambulatory Visit: Payer: Self-pay | Admitting: Family Medicine

## 2015-12-03 ENCOUNTER — Telehealth: Payer: Self-pay | Admitting: Neurology

## 2015-12-03 NOTE — Telephone Encounter (Signed)
I spoke to patient and he is aware. He made f/u appt in April.

## 2015-12-03 NOTE — Telephone Encounter (Signed)
I reviewed patient's AutoPap compliance data from 10/25/2015 through 11/23/2015 which is a total of 30 days during which time he uses AutoPap 27 days with percent used days greater than 4 hours at 83%, indicating very good compliance with an average usage of 4 hours and 40 minutes, residual AHI low at 0.7 per hour, 95th percentile pressure was 9.8 cm, leak acceptable with the 95th percentile at 11.9 L/m, pressure setting of 5-15 cm with EPR. Patient needs to be seen in follow-up, please arrange for follow-up appointment and call patient for it, thanks.

## 2015-12-28 ENCOUNTER — Other Ambulatory Visit: Payer: Self-pay | Admitting: Family Medicine

## 2016-01-22 ENCOUNTER — Encounter: Payer: Self-pay | Admitting: Neurology

## 2016-01-22 ENCOUNTER — Other Ambulatory Visit: Payer: Self-pay

## 2016-01-22 ENCOUNTER — Ambulatory Visit (INDEPENDENT_AMBULATORY_CARE_PROVIDER_SITE_OTHER): Payer: Managed Care, Other (non HMO) | Admitting: Neurology

## 2016-01-22 VITALS — BP 144/68 | HR 90 | Resp 18 | Ht 66.0 in | Wt 210.0 lb

## 2016-01-22 DIAGNOSIS — G4733 Obstructive sleep apnea (adult) (pediatric): Secondary | ICD-10-CM | POA: Diagnosis not present

## 2016-01-22 DIAGNOSIS — G4734 Idiopathic sleep related nonobstructive alveolar hypoventilation: Secondary | ICD-10-CM | POA: Diagnosis not present

## 2016-01-22 DIAGNOSIS — E669 Obesity, unspecified: Secondary | ICD-10-CM

## 2016-01-22 NOTE — Progress Notes (Signed)
Subjective:    Patient ID: Derrick Becker is a 70 y.o. male.  HPI     Interim history:   Derrick Becker is a 70 year old right-handed gentleman with an underlying medical history of type 2 diabetes, hypertension, hyperlipidemia and obesity, who in particular his sleep apnea after a recent home sleep test. The patient is unaccompanied today. I first met him on 07/02/2015 at the request of his primary care physician, at which time the patient reported snoring, excessive daytime somnolence, sleep disruption, difficulty going to sleep and maintaining sleep. The patient was invited back for sleep study. His insurance denied an in-house sleep study. He had a home sleep test on 08/15/2015, which showed a total AHI of 42.9 per hour, oxycodone desaturation nadir of 60%, time below 88% saturation was over 4 hours in keeping with significant nocturnal hypoxemia. Is insurance unfortunately also denied an in-house CPAP titration study which I requested. I placed him on AutoPap therapy. I also suggested we check an overnight pulse oximetry while he is on AutoPap therapy after he is established on treatment. This did not get done.  Today, 01/22/2016: I reviewed his AutoPap compliance data from 12/22/2015 through 01/20/2016 which is a total of 30 days during which time he used his machine 28 days with percent used days greater than 4 hours at 87%, indicating very good compliance with an average usage of fibers and 1 minute, residual AHI 0.9 per hour, 95th percentile pressure at 9.8 cm, leak acceptable with the 95th percentile at 16.6 L/m, pressure range of 5-15 cm with EPR.  Today, 01/22/2016: He reports doing well. He feels better rested. He has adjusted fairly well to treatment. He needed to change his nasal pillows. He feels he would not be able to tolerate the nasal mask or full facemask. He would like to get a chinstrap because he has noticed occasional mouth opening and mouth dryness. Otherwise, he is doing well.  He has gained a few pounds he admits. He does not always drink enough water. He works full-time. He has to get up at 4 AM. He lives alone.  Previously:  07/02/2015: He reports snoring and excessive daytime somnolence with nighttime sleep disruption including difficulty maintaining sleep and difficulty going to sleep. His ESS is 16/24 and his fatigue score is 43/63. He has has at times loud snoring. Symptoms have been ongoing for months to perhaps use. He does not wake up rested. He denies morning headaches and has nocturia occasionally. He denies frank restless leg symptoms and does not know if he twitches his legs in his sleep. He lives alone. He is divorced. He has 2 grown sons. He works full-time at a [employer], [employer], 6 AM to 2:30 PM, Monday through Friday. He does not smoke. He drinks alcohol occasionally but has had heavier drinking in the past. He drinks coffee one cup per day and typically no additional caffeine, occasional sodas. Has not lost any significant amount of weight. He typically stays above the 200 pound mark and fluctuates a few pounds at a time. His brother has obstructive sleep apnea and uses a CPAP machine. This year, his son is also been diagnosed with OSA and has a CPAP machine now. His bedtime is usually around 10 or 10:30 and sometimes he has trouble going to sleep. He has never taken a sleeping pill or over-the-counter sleep aid. His rise time is typically around 4:30 AM. I reviewed your office note from 05/30/2015.  His Past Medical History Is Significant For:  Past Medical History  Diagnosis Date  . Hypertension   . Diabetes mellitus without complication ([place])   . Hyperlipemia     His Past Surgical History Is Significant For: Past Surgical History  Procedure Laterality Date  . Fracture surgery      R forearm    His Family History Is Significant For: Family History  Problem Relation Age of Onset  . Lung cancer Father   . Kidney cancer Brother     His Social  History Is Significant For: Social History   Social History  . Marital Status: Divorced    Spouse Name: N/A  . Number of Children: 2  . Years of Education: [employer]   Social History Main Topics  . Smoking status: Never Smoker   . Smokeless tobacco: None  . Alcohol Use: 0.0 oz/week    0 Standard drinks or equivalent per week  . Drug Use: No  . Sexual Activity: No   Other Topics Concern  . None   Social History Narrative    His Allergies Are:  No Known Allergies:   His Current Medications Are:  Outpatient Encounter Prescriptions as of 01/22/2016  Medication Sig  . amLODipine-atorvastatin (CADUET) 10-80 MG tablet TAKE 1 TABLET BY MOUTH DAILY.  Marland Kitchen glipiZIDE (GLUCOTROL) 10 MG tablet TAKE ONE TWICE DAILY FOR DIABETES  . losartan-hydrochlorothiazide (HYZAAR) 100-25 MG tablet TAKE ONE EACH MORNING FOR BLOOD PRESSURE  . metFORMIN (GLUCOPHAGE) 1000 MG tablet TAKE ONE TABLET BY MOUTH TWICE DAILY FOR DIABETES.   No facility-administered encounter medications on file as of 01/22/2016.  :  Review of Systems:  Out of a complete 14 point review of systems, all are reviewed and negative with the exception of these symptoms as listed below:   Review of Systems  Neurological:       Patient feels like he is doing well on Auto-PAP. States that he may need a chin strap due to mouth dryness.     Objective:  Neurologic Exam  Physical Exam Physical Examination:   Filed Vitals:   01/22/16 1519  BP: 144/68  Pulse: 90  Resp: 18    General Examination: The patient is a very pleasant 70 y.o. male in no acute distress. He appears well-developed and well-nourished and well groomed. He is obese.  HEENT: Normocephalic, atraumatic, pupils are equal, round and reactive to light and accommodation. Extraocular tracking is good without limitation to gaze excursion or nystagmus noted. Normal smooth pursuit is noted. Hearing is grossly intact. Face is symmetric with normal facial animation and normal  facial sensation. Speech is clear with no dysarthria noted. There is no hypophonia. There is no lip, neck/head, jaw or voice tremor. Neck is supple with full range of passive and active motion. There are no carotid bruits on auscultation. Oropharynx exam reveals: mild mouth dryness, adequate dental hygiene with a partial on the top. He has moderate airway crowding based on a narrow airway entry and redundant soft palate, left side is reaching a little lower and this appears to be asymmetrical. Tonsils are absent. Uvula is small. Mallampati is class II. Tongue protrudes centrally and palate elevates symmetrically.  Chest: Clear to auscultation without wheezing, rhonchi or crackles noted.  Heart: S1+S2+0, regular and normal without murmurs, rubs or gallops noted.   Abdomen: Soft, non-tender and non-distended with normal bowel sounds appreciated on auscultation.  Extremities: There is trace pitting edema in the distal lower extremities bilaterally, around both ankles and dorsi of both feet. Pedal pulses are intact.  Skin:  Warm and dry without trophic changes noted. There are no varicose veins.  Musculoskeletal: exam reveals no obvious joint deformities, tenderness or joint swelling or erythema.   Neurologically:  Mental status: The patient is awake, alert and oriented in all 4 spheres. His immediate and remote memory, attention, language skills and fund of knowledge are appropriate. There is no evidence of aphasia, agnosia, apraxia or anomia. Speech is clear with normal prosody and enunciation. Thought process is linear. Mood is normal and affect is normal.  Cranial nerves II - XII are as described above under HEENT exam. In addition: shoulder shrug is normal with equal shoulder height noted. Motor exam: Normal bulk, strength and tone is noted. There is no drift, tremor or rebound. Romberg is negative, with the exception of mild sway. Reflexes are 2+ in the upper extremities, 1+ in both knees and trace  in both ankles. Fine motor skills and coordination: intact with normal finger taps, normal hand movements, normal rapid alternating patting, normal foot taps and normal foot agility.  Cerebellar testing: No dysmetria or intention tremor on finger to nose testing. Heel to shin is unremarkable bilaterally. There is no truncal or gait ataxia.  Sensory exam: intact to light touch in the upper and lower extremities.  Gait, station and balance: He stands easily. No veering to one side is noted. No leaning to one side is noted. Posture is age-appropriate and stance is narrow based. Gait shows normal stride length and normal pace. No problems turning are noted. He turns en bloc. Tandem walk is slightly difficult for him initially.   Assessment and Plan:   In summary, TRANQUILINO FISCHLER is a very pleasant 70 year old male with an underlying medical history of type 2 diabetes, hypertension, hyperlipidemia and obesity, who Presents for follow-up consultation of his severe obstructive sleep apnea as determined by home sleep test in November 2016. Unfortunately, he did not have an attended sleep study or CPAP titration study. He has been on AutoPap therapy and has been compliant with it. His residual AHI is low. His leak is acceptable. He is using nasal pillows. I would like to proceed with an overnight pulse oximetry test while he is on his AutoPap at night. His home sleep test suggested significant desaturations as low as 68% and time below 88% saturation was over 4 hours for the night. He is encouraged to pursue weight loss and good sleep hygiene and commended for his AutoPap compliance. His 95th percentile pressure is right around 10 cm, we can change this in the future to a set pressure of 10 cm CPAP. His physical exam is stable. He has gained a few pounds. We talked about sleep apnea and its risks and ramifications again today. He is encouraged to continue using his machine regularly and also take it with him when he  leaves town.  I would like to see him back in 6 months, sooner if needed. I answered all his questions today and he was in agreement.  I spent 25 minutes in total face-to-face time with the patient, more than 50% of which was spent in counseling and coordination of care, reviewing test results, reviewing medication and discussing or reviewing the diagnosis of OSA, its prognosis and treatment options.

## 2016-01-22 NOTE — Patient Instructions (Signed)
Please continue using your autoPAP regularly. While your insurance requires that you use PAP at least 4 hours each night on 70% of the nights, I recommend, that you not skip any nights and use it throughout the night if you can. Getting used to PAP and staying with the treatment long term does take time and patience and discipline. Untreated obstructive sleep apnea when it is moderate to severe can have an adverse impact on cardiovascular health and raise her risk for heart disease, arrhythmias, hypertension, congestive heart failure, stroke and diabetes. Untreated obstructive sleep apnea causes sleep disruption, nonrestorative sleep, and sleep deprivation. This can have an impact on your day to day functioning and cause daytime sleepiness and impairment of cognitive function, memory loss, mood disturbance, and problems focussing. Using PAP regularly can improve these symptoms.  We will do an overnight oxygen level test, called ONO, and your DME company will call and set this up for one night, while you also use your autoPAP machine. We will call you with the results.   Please work on good hydration with water and weight loss.

## 2016-01-25 ENCOUNTER — Other Ambulatory Visit: Payer: Self-pay | Admitting: Family Medicine

## 2016-02-17 ENCOUNTER — Encounter: Payer: Self-pay | Admitting: Family Medicine

## 2016-02-17 ENCOUNTER — Ambulatory Visit (INDEPENDENT_AMBULATORY_CARE_PROVIDER_SITE_OTHER): Payer: Managed Care, Other (non HMO) | Admitting: Family Medicine

## 2016-02-17 VITALS — BP 132/62 | HR 74 | Temp 97.8°F | Resp 16 | Ht 66.0 in | Wt 209.0 lb

## 2016-02-17 DIAGNOSIS — E119 Type 2 diabetes mellitus without complications: Secondary | ICD-10-CM

## 2016-02-17 DIAGNOSIS — I10 Essential (primary) hypertension: Secondary | ICD-10-CM | POA: Diagnosis not present

## 2016-02-17 DIAGNOSIS — E785 Hyperlipidemia, unspecified: Secondary | ICD-10-CM

## 2016-02-17 LAB — COMPLETE METABOLIC PANEL WITH GFR
ALT: 21 U/L (ref 9–46)
AST: 17 U/L (ref 10–35)
Albumin: 4.5 g/dL (ref 3.6–5.1)
Alkaline Phosphatase: 56 U/L (ref 40–115)
BILIRUBIN TOTAL: 0.5 mg/dL (ref 0.2–1.2)
BUN: 15 mg/dL (ref 7–25)
CALCIUM: 9.7 mg/dL (ref 8.6–10.3)
CHLORIDE: 101 mmol/L (ref 98–110)
CO2: 24 mmol/L (ref 20–31)
CREATININE: 0.83 mg/dL (ref 0.70–1.18)
GFR, EST NON AFRICAN AMERICAN: 89 mL/min (ref >=60)
Glucose, Bld: 179 mg/dL — ABNORMAL HIGH (ref 65–99)
Potassium: 4.2 mmol/L (ref 3.5–5.3)
Sodium: 137 mmol/L (ref 135–146)
TOTAL PROTEIN: 7.2 g/dL (ref 6.1–8.1)

## 2016-02-17 LAB — LIPID PANEL
CHOLESTEROL: 147 mg/dL (ref 125–200)
HDL: 37 mg/dL — AB (ref >=40)
LDL CALC: 97 mg/dL (ref <130)
TRIGLYCERIDES: 63 mg/dL (ref <150)
Total CHOL/HDL Ratio: 4 Ratio (ref <=5.0)
VLDL: 13 mg/dL (ref <30)

## 2016-02-17 LAB — GLUCOSE, POCT (MANUAL RESULT ENTRY): POC GLUCOSE: 193 mg/dL — AB (ref 70–99)

## 2016-02-17 LAB — POCT GLYCOSYLATED HEMOGLOBIN (HGB A1C): Hemoglobin A1C: 9

## 2016-02-17 MED ORDER — AMLODIPINE-ATORVASTATIN 10-80 MG PO TABS: 1.0000 | ORAL_TABLET | Freq: Every day | ORAL | Status: DC

## 2016-02-17 MED ORDER — SITAGLIPTIN PHOSPHATE 100 MG PO TABS: 100.0000 mg | ORAL_TABLET | Freq: Every day | ORAL | Status: DC

## 2016-02-17 MED ORDER — METFORMIN HCL 1000 MG PO TABS: ORAL_TABLET | ORAL | Status: DC

## 2016-02-17 MED ORDER — GLIPIZIDE 10 MG PO TABS: ORAL_TABLET | ORAL | Status: DC

## 2016-02-17 MED ORDER — LOSARTAN POTASSIUM-HCTZ 100-25 MG PO TABS: ORAL_TABLET | ORAL | Status: DC

## 2016-02-17 NOTE — Progress Notes (Signed)
Patient ID: Derrick Becker, male    DOB: [birth year]  Age: 70 y.o. MRN: [id number]  Chief Complaint  Patient presents with  . 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 He has diabetes and hypertension. He takes his medicines generally on a regular basis though he occasionally forgets. His sugars are running a little higher at home. Otherwise he feels well. No headaches or dizziness. No chest pains or palpitations. No GI or GU complaints. Sensory and musculoskeletal good. He continues to work on a regular basis, though he is contemplating when he might retire. He needs refills on his medications. He is now using the CPAP. He has a little discomfort in his neck, which he thinks may be from laying differently with that on.  Current allergies, medications, problem list, past/family and social histories reviewed.  Objective:  BP 132/62 mmHg  Pulse 74  Temp(Src) 97.8 F (36.6 C)  Resp 16  Ht 5\' 6"  (1.676 m)  Wt 209 lb (94.802 kg)  BMI 33.75 kg/m2  SpO2 96%  Healthy-appearing gentleman in no major acute distress. TMs normal. Eyes PERRLA. Throat clear. Neck supple without nodes. Chest clear to auscultation. Heart regular without murmur. Abdomen soft and nontender. Extremities without edema. Feet normal. Filament sensory testing normal.  Assessment & Plan:   Assessment: 1. Type 2 diabetes mellitus without complication, without long-term current use of insulin ([place])   2. Essential hypertension   3. Hyperlipidemia       Plan: Check labs.  Orders Placed This Encounter  Procedures  . COMPLETE METABOLIC PANEL WITH GFR  . Lipid panel  . POCT glycosylated hemoglobin (Hb A1C)  . POCT glucose (manual entry)    Meds ordered this encounter  Medications  . losartan-hydrochlorothiazide (HYZAAR) 100-25 MG tablet    Sig: TAKE ONE EACH MORNING FOR BLOOD PRESSURE    Dispense:  90 tablet    Refill:  3  .  metFORMIN (GLUCOPHAGE) 1000 MG tablet    Sig: TAKE ONE TABLET BY MOUTH TWICE DAILY FOR DIABETES.    Dispense:  180 tablet    Refill:  3  . amLODipine-atorvastatin (CADUET) 10-80 MG tablet    Sig: Take 1 tablet by mouth daily.    Dispense:  90 tablet    Refill:  3  . glipiZIDE (GLUCOTROL) 10 MG tablet    Sig: TAKE ONE TWICE DAILY FOR DIABETES    Dispense:  180 tablet    Refill:  3   Results for orders placed or performed in visit on 02/17/16  POCT glycosylated hemoglobin (Hb A1C)  Result Value Ref Range   Hemoglobin A1C 9.0   POCT glucose (manual entry)  Result Value Ref Range   POC Glucose 193 (A) 70 - 99 mg/dl   Will add Januvia 1 daily. The other option is to add Invokana.  Can see if that is necessary at future visits.      Patient Instructions   Continue getting regular exercise  Try to eat less, especially decreasing the carbohydrates (breads, pastas, potatoes, etc.) with a goal of losing some weight  Take your medications faithfully  When you return in 4 months ask to have a physical examination.  Return sooner if needed  In addition to your regular medicines begin taking Januvia 100 mg daily.      IF you received an x-ray today, you will receive an invoice from [facility]. Please contact  [facility] at [phone] with questions or concerns regarding your invoice.   IF you received labwork today, you will receive an invoice from [employer]. Please contact [facility] at [phone] with questions or concerns regarding your invoice.   Our billing staff will not be able to assist you with questions regarding bills from these companies.  You will be contacted with the lab results as soon as they are available. The fastest way to get your results is to activate your My Chart account. Instructions are located on the last page of this paperwork. If you have not heard from [place] regarding the results in 2 weeks, please  contact this office.          Return in about 4 months (around 06/19/2016).   HOPPER,DAVID, MD 02/17/2016

## 2016-02-17 NOTE — Patient Instructions (Addendum)
Continue getting regular exercise  Try to eat less, especially decreasing the carbohydrates (breads, pastas, potatoes, etc.) with a goal of losing some weight  Take your medications faithfully  When you return in 4 months ask to have a physical examination.  Return sooner if needed  In addition to your regular medicines begin taking Januvia 100 mg daily.      IF you received an x-ray today, you will receive an invoice from [facility]. Please contact [facility] at [phone] with questions or concerns regarding your invoice.   IF you received labwork today, you will receive an invoice from [employer]. Please contact [facility] at [phone] with questions or concerns regarding your invoice.   Our billing staff will not be able to assist you with questions regarding bills from these companies.  You will be contacted with the lab results as soon as they are available. The fastest way to get your results is to activate your My Chart account. Instructions are located on the last page of this paperwork. If you have not heard from [place] regarding the results in 2 weeks, please contact this office.

## 2016-04-13 ENCOUNTER — Ambulatory Visit (INDEPENDENT_AMBULATORY_CARE_PROVIDER_SITE_OTHER): Payer: Managed Care, Other (non HMO)

## 2016-04-13 ENCOUNTER — Ambulatory Visit (INDEPENDENT_AMBULATORY_CARE_PROVIDER_SITE_OTHER): Payer: Managed Care, Other (non HMO) | Admitting: Family Medicine

## 2016-04-13 VITALS — BP 132/84 | HR 91 | Temp 97.7°F | Resp 16 | Ht 66.0 in | Wt 209.4 lb

## 2016-04-13 DIAGNOSIS — M109 Gout, unspecified: Secondary | ICD-10-CM

## 2016-04-13 DIAGNOSIS — Z8739 Personal history of other diseases of the musculoskeletal system and connective tissue: Secondary | ICD-10-CM

## 2016-04-13 DIAGNOSIS — M25571 Pain in right ankle and joints of right foot: Secondary | ICD-10-CM | POA: Diagnosis not present

## 2016-04-13 DIAGNOSIS — Z8639 Personal history of other endocrine, nutritional and metabolic disease: Secondary | ICD-10-CM

## 2016-04-13 DIAGNOSIS — M10071 Idiopathic gout, right ankle and foot: Secondary | ICD-10-CM

## 2016-04-13 LAB — URIC ACID: Uric Acid, Serum: 6 mg/dL (ref 4.0–8.0)

## 2016-04-13 MED ORDER — COLCHICINE 0.6 MG PO TABS: ORAL_TABLET | ORAL | Status: DC

## 2016-04-13 NOTE — Progress Notes (Signed)
Patient ID: Derrick Becker, male    DOB: [birth year]  Age: 70 y.o. MRN: [id number]  Chief Complaint  Patient presents with  . Joint Swelling    ankle swelling right ankle     Subjective:   Pleasant gentleman well known to me from the past. He has a history of having gout. Used to have a lot of attacks, but has not had any long time. It has usually been his ankle that flared. Last week he was working on the roof cleaning a gutter and may have turned his ankle a little bit. However it was over the next few days it really flared up got swollen and hot on the lateral aspect of the right ankle. He has taken some ibuprofen but despite that is hurting quite a lot. He had use a crutch coming in here today  Current allergies, medications, problem list, past/family and social histories reviewed.  Objective:  BP 132/84 mmHg  Pulse 91  Temp(Src) 97.7 F (36.5 C) (Oral)  Resp 16  Ht 5\' 6"  (1.676 m)  Wt 209 lb 6.4 oz (94.983 kg)  BMI 33.81 kg/m2  SpO2 94%  Significant pain in the right ankle. Swollen in the lateral malleolar area which is mildly erythematous. It is warm to touch. Pedal pulses are good. Toes are normal.  Assessment & Plan:   Assessment: 1. Pain in joint, ankle and foot, right   2. History of gout   3. Acute gout of right ankle, unspecified cause       Plan: Suspicious for gout, but with the possibility of a little trauma I am going ahead and x-raying him.  Orders Placed This Encounter  Procedures  . DG Ankle Complete Right    Order Specific Question:  Reason for Exam (SYMPTOM  OR DIAGNOSIS REQUIRED)    Answer:  ankle pain and swelling lateral malleolus    Order Specific Question:  Preferred imaging location?    Answer:  External  . Uric acid    Meds ordered this encounter  Medications  . colchicine 0.6 MG tablet    Sig: Take 2 pills initially with some food, then 1 twice daily. When doing better can taper to once daily for a few days and then discontinue.   Dispense:  30 tablet    Refill:  1   X-ray appears to help some mild arthritic changes of the ankle but no acute bony abnormalities. Radiologist read the film and agrees.  This is all consistent with gout. Uric acid level is pending. We will treat accordingly.     Patient Instructions   Your symptoms are consistent with gout. Take colchicine 2 pills initially, then 1 twice daily for the next few days, then when doing better decrease to 1 pill daily and discontinue when symptoms are completely gone. As you know the colchicine may cause diarrhea and forced you to cut back on the medication.  You can take ibuprofen 600-800 mg 3 times daily in addition to this for a few days..  If the uric acid level comes back to her I may want to discuss putting on some allopurinol, but we will wait for that result and I will let you know that in a few days.  Return as needed or in September to follow-up on the diabetes as discussed.    IF you received an x-ray today, you will receive an invoice from [facility]. Please contact [facility] Radiology at [phone] with questions or concerns regarding your invoice.  IF you received labwork today, you will receive an invoice from [employer]. Please contact [facility] at [phone] with questions or concerns regarding your invoice.   Our billing staff will not be able to assist you with questions regarding bills from these companies.  You will be contacted with the lab results as soon as they are available. The fastest way to get your results is to activate your My Chart account. Instructions are located on the last page of this paperwork. If you have not heard from [place] regarding the results in 2 weeks, please contact this office.          Return in about 2 months (around 06/14/2016), or if symptoms worsen or fail to improve.   Adrielle Polakowski, MD 04/13/2016

## 2016-04-13 NOTE — Patient Instructions (Addendum)
Your symptoms are consistent with gout. Take colchicine 2 pills initially, then 1 twice daily for the next few days, then when doing better decrease to 1 pill daily and discontinue when symptoms are completely gone. As you know the colchicine may cause diarrhea and forced you to cut back on the medication.  You can take ibuprofen 600-800 mg 3 times daily in addition to this for a few days..  If the uric acid level comes back to her I may want to discuss putting on some allopurinol, but we will wait for that result and I will let you know that in a few days.  Return as needed or in September to follow-up on the diabetes as discussed.    IF you received an x-ray today, you will receive an invoice from [facility]. Please contact [facility]iology at [phone] with questions or concerns regarding your invoice.   IF you received labwork today, you will receive an invoice from [employer]. Please contact [facility] at [phone] with questions or concerns regarding your invoice.   Our billing staff will not be able to assist you with questions regarding bills from these companies.  You will be contacted with the lab results as soon as they are available. The fastest way to get your results is to activate your My Chart account. Instructions are located on the last page of this paperwork. If you have not heard from [place] regarding the results in 2 weeks, please contact this office.

## 2016-07-22 ENCOUNTER — Encounter: Payer: Self-pay | Admitting: Neurology

## 2016-07-23 ENCOUNTER — Ambulatory Visit: Payer: Managed Care, Other (non HMO) | Admitting: Neurology

## 2016-07-27 ENCOUNTER — Ambulatory Visit (INDEPENDENT_AMBULATORY_CARE_PROVIDER_SITE_OTHER): Payer: Managed Care, Other (non HMO) | Admitting: Neurology

## 2016-07-27 ENCOUNTER — Encounter: Payer: Self-pay | Admitting: Neurology

## 2016-07-27 VITALS — BP 132/60 | HR 82 | Resp 16 | Ht 66.0 in | Wt 209.0 lb

## 2016-07-27 DIAGNOSIS — E669 Obesity, unspecified: Secondary | ICD-10-CM

## 2016-07-27 DIAGNOSIS — Z9989 Dependence on other enabling machines and devices: Secondary | ICD-10-CM | POA: Diagnosis not present

## 2016-07-27 DIAGNOSIS — G4734 Idiopathic sleep related nonobstructive alveolar hypoventilation: Secondary | ICD-10-CM | POA: Diagnosis not present

## 2016-07-27 DIAGNOSIS — G4733 Obstructive sleep apnea (adult) (pediatric): Secondary | ICD-10-CM

## 2016-07-27 NOTE — Patient Instructions (Signed)
Please talk to your primary care doctor about alternative blood pressure and/or cholesterol medications.  Please continue using your autoPAP regularly. While your insurance requires that you use PAP at least 4 hours each night on 70% of the nights, I recommend, that you not skip any nights and use it throughout the night if you can. Getting used to PAP and staying with the treatment long term does take time and patience and discipline. Untreated obstructive sleep apnea when it is moderate to severe can have an adverse impact on cardiovascular health and raise her risk for heart disease, arrhythmias, hypertension, congestive heart failure, stroke and diabetes. Untreated obstructive sleep apnea causes sleep disruption, nonrestorative sleep, and sleep deprivation. This can have an impact on your day to day functioning and cause daytime sleepiness and impairment of cognitive function, memory loss, mood disturbance, and problems focussing. Using PAP regularly can improve these symptoms. We will do an overnight oxygen level test, called ONO, and your DME company will call and set this up for one night, while you also use your autoPAP. We will call you with the results.   I will see you back in 6 months.

## 2016-07-27 NOTE — Progress Notes (Signed)
Subjective:    Becker ID: Derrick Becker is a 70 y.o. male.  HPI     Interim history:   Derrick Becker is a 70 year old right-handed gentleman with an underlying medical history of type 2 diabetes, hypertension, hyperlipidemia and obesity, who in particular his sleep apnea after a recent home sleep test. Derrick Becker is unaccompanied today. I last saw Derrick Becker on 01/22/2016, at which time Derrick Becker was compliant with AutoPap. Unfortunately, his overnight pulse oximetry test had not been done. We re-requested that from his DME. Derrick Becker reported doing well, Derrick Becker was adjusting well to treatment, Derrick Becker felt better rested with AutoPap therapy and requested to try a chinstrap because of occasional mouth opening and mouth dryness. Otherwise Derrick Becker was doing rather well. I suggested we proceed with overnight pulse oximetry testing while Derrick Becker is on AutoPap therapy to ensure proper oxygen saturations while on treatment. Unfortunately, this has yet to be done. We called his DME company on 07/23/2016 and they had not done Derrick ONO yet.   Today, 07/27/2016: I reviewed his AutoPap compliance data from 06/23/2016 through 07/22/2016 which is a total of 30 days, during which time Derrick Becker used his machine 27 days with percent used days greater than 4 hours at 83%, indicating very good compliance with an average usage for all days of 4 hours and 28 minutes, residual AHI 1 per hour, 95th percentile of pressure at 9.3 cm, and leak acceptable for Derrick 95th percentile at 14.8 L/m, range of 5 cm to 15 cm with EPR.  Today, 07/27/2016: Derrick Becker reports doing well with autoPAP, no new complaints. Did not have Derrick ONO done, for unclear reasons, I ordered it in Derrick past, did not happen, had intermittent ankle swelling this summer, particulary on Derrick R, was on treatment for gout in Derrick past. Derrick Becker took himself off of his BP/chol combination medication, assuming it was Derrick atorvastatin in it, but I suspect, it was Derrick amlodipine in Derrick Caduet, that caused his ankles to swell,  nevertheless, is encouraged to talk to PCP about it as Derrick Becker stopped medication on his own.    Previously:  I first met Derrick Becker on 07/02/2015 at Derrick request of his primary care physician, at which time Derrick Becker reported snoring, excessive daytime somnolence, sleep disruption, difficulty going to sleep and maintaining sleep. Derrick Becker was invited back for sleep study. His insurance denied an in-house sleep study. Derrick Becker had a home sleep test on 08/15/2015, which showed a total AHI of 42.9 per hour, oxycodone desaturation nadir of 60%, time below 88% saturation was over 4 hours in keeping with significant nocturnal hypoxemia. Is insurance unfortunately also denied an in-house CPAP titration study which I requested. I placed Derrick Becker on AutoPap therapy. I also suggested we check an overnight pulse oximetry while Derrick Becker is on AutoPap therapy after Derrick Becker is established on treatment. This did not get done.   I reviewed his AutoPap compliance data from 12/22/2015 through 01/20/2016 which is a total of 30 days during which time Derrick Becker used his machine 28 days with percent used days greater than 4 hours at 87%, indicating very good compliance with an average usage of fibers and 1 minute, residual AHI 0.9 per hour, 95th percentile pressure at 9.8 cm, leak acceptable with Derrick 95th percentile at 16.6 L/m, pressure range of 5-15 cm with EPR.  07/02/2015: Derrick Becker reports snoring and excessive daytime somnolence with nighttime sleep disruption including difficulty maintaining sleep and difficulty going to sleep. His ESS is 16/24 and his fatigue score  is 43/63. Derrick Becker has has at times loud snoring. Symptoms have been ongoing for months to perhaps use. Derrick Becker does not wake up rested. Derrick Becker denies morning headaches and has nocturia occasionally. Derrick Becker denies frank restless leg symptoms and does not know if Derrick Becker twitches his legs in his sleep. Derrick Becker lives alone. Derrick Becker is divorced. Derrick Becker has 2 grown sons. Derrick Becker works full-time at a [employer], [employer], 6 AM to 2:30 PM, Monday through  Friday. Derrick Becker does not smoke. Derrick Becker drinks alcohol occasionally but has had heavier drinking in Derrick past. Derrick Becker drinks coffee one cup per day and typically no additional caffeine, occasional sodas. Has not lost any significant amount of weight. Derrick Becker typically stays above Derrick 200 pound mark and fluctuates a few pounds at a time. His brother has obstructive sleep apnea and uses a CPAP machine. This year, his son is also been diagnosed with OSA and has a CPAP machine now. His bedtime is usually around 10 or 10:30 and sometimes Derrick Becker has trouble going to sleep. Derrick Becker has never taken a sleeping pill or over-Derrick-counter sleep aid. His rise time is typically around 4:30 AM. I reviewed your office note from 05/30/2015.    His Past Medical History Is Significant For: Past Medical History:  Diagnosis Date  . Diabetes mellitus without complication ([place])   . Hyperlipemia   . Hypertension     His Past Surgical History Is Significant For: Past Surgical History:  Procedure Laterality Date  . FRACTURE SURGERY     R forearm    His Family History Is Significant For: Family History  Problem Relation Age of Onset  . Lung cancer Father   . Kidney cancer Brother     His Social History Is Significant For: Social History   Social History  . Marital status: Divorced    Spouse name: N/A  . Number of children: 2  . Years of education: College   Social History Main Topics  . Smoking status: Never Smoker  . Smokeless tobacco: None  . Alcohol use 0.0 oz/week  . Drug use: No  . Sexual activity: No   Other Topics Concern  . None   Social History Narrative  . None    His Allergies Are:  No Known Allergies:   His Current Medications Are:  Outpatient Encounter Prescriptions as of 07/27/2016  Medication Sig  . amLODipine-atorvastatin (CADUET) 10-80 MG tablet Take 1 tablet by mouth daily.  . colchicine 0.6 MG tablet Take 2 pills initially with some food, then 1 twice daily. When doing better can taper to once  daily for a few days and then discontinue.  Marland Kitchen glipiZIDE (GLUCOTROL) 10 MG tablet TAKE ONE TWICE DAILY FOR DIABETES  . losartan-hydrochlorothiazide (HYZAAR) 100-25 MG tablet TAKE ONE EACH MORNING FOR BLOOD PRESSURE  . metFORMIN (GLUCOPHAGE) 1000 MG tablet TAKE ONE TABLET BY MOUTH TWICE DAILY FOR DIABETES.  Marland Kitchen sitaGLIPtin (JANUVIA) 100 MG tablet Take 1 tablet (100 mg total) by mouth daily.   No facility-administered encounter medications on file as of 07/27/2016.   :  Review of Systems:  Out of a complete 14 point review of systems, all are reviewed and negative with Derrick exception of these symptoms as listed below:  Review of Systems  Neurological:       Becker states that Derrick Becker is doing well with Auto-PAP. Has tried some new masks. Per Becker ONO was never completed. I confirmed this with [employer].     Objective:  Neurologic Exam  Physical Exam Physical Examination:  Vitals:   07/27/16 1355  BP: 132/60  Pulse: 82  Resp: 16    General Examination: Derrick Becker is a very pleasant 70 y.o. male in no acute distress. Derrick Becker appears well-developed and well-nourished and well groomed.   HEENT: Normocephalic, atraumatic, pupils are equal, round and reactive to light and accommodation. Extraocular tracking is good without limitation to gaze excursion or nystagmus noted. Normal smooth pursuit is noted. Hearing is grossly intact. Face is symmetric with normal facial animation and normal facial sensation. Speech is clear with no dysarthria noted. There is no hypophonia. There is no lip, neck/head, jaw or voice tremor. Neck is supple with full range of passive and active motion. There are no carotid bruits on auscultation. Oropharynx exam reveals: mild mouth dryness, adequate dental hygiene with a partial on Derrick top. Derrick Becker has moderate airway crowding based on a narrow airway entry and redundant soft palate, left side is reaching a little lower and this appears to be asymmetrical. Tonsils are absent. Uvula is  small. Mallampati is class II. Tongue protrudes centrally and palate elevates symmetrically.  Chest: Clear to auscultation without wheezing, rhonchi or crackles noted.  Heart: S1+S2+0, regular and normal without murmurs, rubs or gallops noted.   Abdomen: Soft, non-tender and non-distended with normal bowel sounds appreciated on auscultation.  Extremities: There is trace pitting edema in Derrick distal lower extremities bilaterally, around both ankles, mostly R ankle.   Skin: Warm and dry without trophic changes noted. There are no varicose veins.  Musculoskeletal: exam reveals no obvious joint deformities, tenderness or joint swelling or erythema.   Neurologically:  Mental status: Derrick Becker is awake, alert and oriented in all 4 spheres. His immediate and remote memory, attention, language skills and fund of knowledge are appropriate. There is no evidence of aphasia, agnosia, apraxia or anomia. Speech is clear with normal prosody and enunciation. Thought process is linear. Mood is normal and affect is normal.  Cranial nerves II - XII are as described above under HEENT exam. In addition: shoulder shrug is normal with equal shoulder height noted. Motor exam: Normal bulk, strength and tone is noted. There is no drift, tremor or rebound. Romberg is negative, with Derrick exception of mild sway. Reflexes are 2+ in Derrick upper extremities, 1+ in both knees and trace in both ankles. Fine motor skills and coordination: intact with normal finger taps, normal hand movements, normal rapid alternating patting, normal foot taps and normal foot agility.  Cerebellar testing: No dysmetria or intention tremor on finger to nose testing. Heel to shin is unremarkable bilaterally. Sensory exam: intact to light touch in Derrick upper and lower extremities.  Gait, station and balance: Derrick Becker stands easily. No veering to one side is noted. No leaning to one side is noted. Posture is age-appropriate and stance is narrow based. Gait shows  normal stride length and normal pace. No problems turning are noted. Tandem walk is slightly difficult for Derrick Becker initially, stable.   Assessment and Plan:   In summary, RISHIT BURKHALTER is a very pleasant 70 year old male with an underlying medical history of type 2 diabetes, hypertension, hyperlipidemia and obesity, who Presents for follow-up consultation of his severe obstructive sleep apnea as determined by a HST on 08/15/2015. Unfortunately, Derrick Becker did not have an attended sleep study or CPAP titration study. Derrick Becker has been on AutoPap therapy and has been compliant with it. His residual AHI is low. His leak is acceptable. Derrick Becker is using nasal pillows. Exam is stable, with Derrick exception of  trace ankle swelling on Derrick R. Derrick Becker is advised to talk to his primary care doctor about alternative blood pressure and/or cholesterol medications, as Derrick Becker stopped his Caduet generic.   I would like to request for Derrick 3rd time an overnight pulse oximetry test while Derrick Becker is on his AutoPap at night. His home sleep test suggested significant desaturations as low as 68% and time below 88% saturation was over 4 hours for Derrick night. Derrick Becker is encouraged to pursue weight loss and good sleep hygiene and commended for his AutoPap compliance. His 95th percentile pressure is right around 10 cm, we can change this in Derrick future to a set pressure of 10 cm CPAP. We talked about sleep apnea and its risks and ramifications again today. Derrick Becker is encouraged to continue using his machine regularly. I would like to see Derrick Becker back in 6 months, sooner if needed. I answered all his questions today and Derrick Becker was in agreement.  I spent 25 minutes in total face-to-face time with Derrick Becker, more than 50% of which was spent in counseling and coordination of care, reviewing test results, reviewing medication and discussing or reviewing Derrick diagnosis of OSA, its prognosis and treatment options.

## 2016-07-28 ENCOUNTER — Other Ambulatory Visit: Payer: Self-pay | Admitting: Family Medicine

## 2016-07-28 DIAGNOSIS — E119 Type 2 diabetes mellitus without complications: Secondary | ICD-10-CM

## 2016-08-03 ENCOUNTER — Other Ambulatory Visit: Payer: Self-pay | Admitting: Family Medicine

## 2016-08-03 DIAGNOSIS — E119 Type 2 diabetes mellitus without complications: Secondary | ICD-10-CM

## 2016-08-26 ENCOUNTER — Telehealth: Payer: Self-pay | Admitting: Neurology

## 2016-08-26 NOTE — Telephone Encounter (Signed)
I reviewed the patient's ONO (overnight pulse oximetry report) from 08/06/16, while on CPAP and room air. His baseline oxygen saturation for the night was 92.2% and minimum oxygen saturation was 88%. Time below 88% saturation was 0.1 minutes. Based on the available data, it appears that the patient is adequately treated with CPAP therapy alone and does not require supplemental oxygen with CPAP therapy. Please call patient to notify.

## 2016-08-26 NOTE — Telephone Encounter (Signed)
I left message with results below. Left call back number for further questions.

## 2016-10-28 LAB — HM DIABETES EYE EXAM

## 2016-10-29 ENCOUNTER — Telehealth: Payer: Self-pay

## 2016-10-29 ENCOUNTER — Other Ambulatory Visit: Payer: Self-pay | Admitting: Family Medicine

## 2016-10-29 NOTE — Telephone Encounter (Signed)
Please call pt and schedule him an appt within a month with his new PCP

## 2016-10-29 NOTE — Telephone Encounter (Signed)
MyChart message sent to patient about scheduling an appointment per Windell Hummingbird.

## 2016-12-13 ENCOUNTER — Other Ambulatory Visit: Payer: Self-pay | Admitting: Family Medicine

## 2016-12-13 DIAGNOSIS — M109 Gout, unspecified: Secondary | ICD-10-CM

## 2017-01-25 ENCOUNTER — Ambulatory Visit (INDEPENDENT_AMBULATORY_CARE_PROVIDER_SITE_OTHER): Payer: Managed Care, Other (non HMO) | Admitting: Neurology

## 2017-01-25 ENCOUNTER — Encounter: Payer: Self-pay | Admitting: Neurology

## 2017-01-25 VITALS — BP 139/71 | HR 89 | Ht 66.0 in | Wt 207.0 lb

## 2017-01-25 DIAGNOSIS — G4733 Obstructive sleep apnea (adult) (pediatric): Secondary | ICD-10-CM

## 2017-01-25 DIAGNOSIS — Z9989 Dependence on other enabling machines and devices: Secondary | ICD-10-CM

## 2017-01-25 NOTE — Progress Notes (Signed)
Subjective:    Patient ID: Derrick Becker is a 71 y.o. male.  HPI     Interim history:   Derrick Becker is a 71 year old right-handed gentleman with an underlying medical history of type 2 diabetes, hypertension, hyperlipidemia and obesity, who presents for follow up consultation of his obstructive sleep apnea, on AutoPap therapy. The patient is unaccompanied today. I last saw him on 07/27/2016, at which time he reported doing well on AutoPap therapy. He felt better. He had very good compliance with AutoPap. He had not had his also oximetry test yet. I suggested he continue with AutoPap therapy and we order a pulse ox oximetry test overnight.  Today, 01/25/2017 (all dictated new, as well as above notes, some dictation done in note pad or Word, outside of chart, may appear as copied):  I reviewed his AutoPap compliance data from 12/21/2016 through 01/19/2017, which is a total of 30 days, during which time he used his CPAP only 19 days with percent used days greater than 4 hours at 57%, indicating suboptimal compliance with an average usage of 4 hours and 32 minutes, residual AHI 0.6 per hour, 95th percentile pressure of 9.4 cm, leak acceptable for the 95th percentile at 14.3 L/m, range of 5-15 cm with EPR. He had an overnight pulse oximetry test on 08/05/2016 which I reviewed: Average oxygen saturation was 95%, nadir was 88%. Time below 88% saturation was 0 minutes. He reports doing well. He did miss a few days last month but overall has been compliant. He has no complaints about the CPAP with the exception of mouth dryness intermittently, a couple of times he pulled the mask and the entire machine of the night stand in his sleep. He is getting ready to retire later this year. He will have some insurance change at the time. He uses nasal pillows, he would like to try a chinstrap. He is also switching to a new PCP since Dr. Linna Darner retired. His last A1c was around 9. He is working on better sugar control. He  has some fluctuation in his weight but stays within the same realm.  Previously (copied from previous notes for reference):   I saw him on 01/22/2016, at which time he was compliant with AutoPap. Unfortunately, his overnight pulse oximetry test had not been done. We re-requested that from his DME. He reported doing well, he was adjusting well to treatment, he felt better rested with AutoPap therapy and requested to try a chinstrap because of occasional mouth opening and mouth dryness. Otherwise he was doing rather well. I suggested we proceed with overnight pulse oximetry testing while he is on AutoPap therapy to ensure proper oxygen saturations while on treatment. Unfortunately, this has yet to be done. We called his DME company on 07/23/2016 and they had not done the ONO yet.    I reviewed his AutoPap compliance data from 06/23/2016 through 07/22/2016 which is a total of 30 days, during which time he used his machine 27 days with percent used days greater than 4 hours at 83%, indicating very good compliance with an average usage for all days of 4 hours and 28 minutes, residual AHI 1 per hour, 95th percentile of pressure at 9.3 cm, and leak acceptable for the 95th percentile at 14.8 L/m, range of 5 cm to 15 cm with EPR.   I first met him on 07/02/2015 at the request of his primary care physician, at which time the patient reported snoring, excessive daytime somnolence, sleep disruption, difficulty going  to sleep and maintaining sleep. The patient was invited back for sleep study. His insurance denied an in-house sleep study. He had a home sleep test on 08/15/2015, which showed a total AHI of 42.9 per hour, oxycodone desaturation nadir of 60%, time below 88% saturation was over 4 hours in keeping with significant nocturnal hypoxemia. Is insurance unfortunately also denied an in-house CPAP titration study which I requested. I placed him on AutoPap therapy. I also suggested we check an overnight pulse  oximetry while he is on AutoPap therapy after he is established on treatment. This did not get done.   I reviewed his AutoPap compliance data from 12/22/2015 through 01/20/2016 which is a total of 30 days during which time he used his machine 28 days with percent used days greater than 4 hours at 87%, indicating very good compliance with an average usage of fibers and 1 minute, residual AHI 0.9 per hour, 95th percentile pressure at 9.8 cm, leak acceptable with the 95th percentile at 16.6 L/m, pressure range of 5-15 cm with EPR.   07/02/2015: He reports snoring and excessive daytime somnolence with nighttime sleep disruption including difficulty maintaining sleep and difficulty going to sleep. His ESS is 16/24 and his fatigue score is 43/63. He has has at times loud snoring. Symptoms have been ongoing for months to perhaps use. He does not wake up rested. He denies morning headaches and has nocturia occasionally. He denies frank restless leg symptoms and does not know if he twitches his legs in his sleep. He lives alone. He is divorced. He has 2 grown sons. He works full-time at a [employer], [employer], 6 AM to 2:30 PM, Monday through Friday. He does not smoke. He drinks alcohol occasionally but has had heavier drinking in the past. He drinks coffee one cup per day and typically no additional caffeine, occasional sodas. Has not lost any significant amount of weight. He typically stays above the 200 pound mark and fluctuates a few pounds at a time. His brother has obstructive sleep apnea and uses a CPAP machine. This year, his son is also been diagnosed with OSA and has a CPAP machine now. His bedtime is usually around 10 or 10:30 and sometimes he has trouble going to sleep. He has never taken a sleeping pill or over-the-counter sleep aid. His rise time is typically around 4:30 AM. I reviewed your office note from 05/30/2015.  The patient's allergies, current medications, family history, past medical history, past  social history, past surgical history and problem list were reviewed and updated as appropriate.    His Past Medical History Is Significant For: Past Medical History:  Diagnosis Date  . Diabetes mellitus without complication ([place])   . Hyperlipemia   . Hypertension     His Past Surgical History Is Significant For: Past Surgical History:  Procedure Laterality Date  . FRACTURE SURGERY     R forearm    His Family History Is Significant For: Family History  Problem Relation Age of Onset  . Lung cancer Father   . Kidney cancer Brother     His Social History Is Significant For: Social History   Social History  . Marital status: Divorced    Spouse name: N/A  . Number of children: 2  . Years of education: College   Social History Main Topics  . Smoking status: Never Smoker  . Smokeless tobacco: Never Used  . Alcohol use 0.0 oz/week  . Drug use: No  . Sexual activity: No   Other  Topics Concern  . None   Social History Narrative  . None    His Allergies Are:  No Known Allergies:   His Current Medications Are:  Outpatient Encounter Prescriptions as of 01/25/2017  Medication Sig  . amLODipine-atorvastatin (CADUET) 10-80 MG tablet Take 1 tablet by mouth daily.  . colchicine 0.6 MG tablet Take 2 pills initially with some food, then 1 twice daily. When doing better can taper to once daily for a few days and then discontinue.  Marland Kitchen glipiZIDE (GLUCOTROL) 10 MG tablet TAKE ONE TWICE DAILY FOR DIABETES  . JANUVIA 100 MG tablet TAKE 1 TABLET (100 MG TOTAL) BY MOUTH DAILY.  Marland Kitchen losartan-hydrochlorothiazide (HYZAAR) 100-25 MG tablet TAKE ONE EACH MORNING FOR BLOOD PRESSURE  . metFORMIN (GLUCOPHAGE) 1000 MG tablet TAKE ONE TABLET BY MOUTH TWICE DAILY FOR DIABETES.  . [DISCONTINUED] JANUVIA 100 MG tablet TAKE 1 TABLET (100 MG TOTAL) BY MOUTH DAILY.  . [DISCONTINUED] JANUVIA 100 MG tablet TAKE 1 TABLET BY MOUTH DAILY   No facility-administered encounter medications on file as of  01/25/2017.   :  Review of Systems:  Out of a complete 14 point review of systems, all are reviewed and negative with the exception of these symptoms as listed below:  Review of Systems  Neurological:       Pt presents today to discuss his cpap. Pt wakes up with a dry mouth sometimes.    Objective:  Neurologic Exam  Physical Exam Physical Examination:   Vitals:   01/25/17 1513  BP: 139/71  Pulse: 89    General Examination: The patient is a very pleasant 71 y.o. male in no acute distress. He appears well-developed and well-nourished and well groomed.   HEENT: Normocephalic, atraumatic, pupils are equal, round and reactive to light and accommodation. Extraocular tracking is good without limitation to gaze excursion or nystagmus noted. Normal smooth pursuit is noted. Hearing is grossly intact. Face is symmetric with normal facial animation and normal facial sensation. Speech is clear with no dysarthria noted. There is no hypophonia. There is no lip, neck/head, jaw or voice tremor. Neck is supple with full range of passive and active motion. There are no carotid bruits on auscultation. Oropharynx exam reveals: mild mouth dryness, adequate dental hygiene and moderate airway crowding.    Chest: Clear to auscultation without wheezing, rhonchi or crackles noted.  Heart: S1+S2+0, regular and normal without murmurs, rubs or gallops noted.   Abdomen: Soft, non-tender and non-distended with normal bowel sounds appreciated on auscultation.  Extremities: There is no pitting edema in the distal lower extremities bilaterally. Pedal pulses are intact.  Skin: Warm and dry without trophic changes noted.  Musculoskeletal: exam reveals no obvious joint deformities, tenderness or joint swelling or erythema. Unremarkable surgical scars right forearm.  Neurologically:  Mental status: The patient is awake, alert and oriented in all 4 spheres. His immediate and remote memory, attention, language skills  and fund of knowledge are appropriate. There is no evidence of aphasia, agnosia, apraxia or anomia. Speech is clear with normal prosody and enunciation. Thought process is linear. Mood is normal and affect is normal.  Cranial nerves II - XII are as described above under HEENT exam. In addition: shoulder shrug is normal with equal shoulder height noted. Motor exam: Normal bulk, strength and tone is noted. There is no drift, tremor or rebound. Romberg is negative. Reflexes are 1+. Fine motor skills and coordination: intact.   Cerebellar testing: No dysmetria or intention tremor. There is no truncal or  gait ataxia.  Sensory exam: intact to light touch in the upper and lower extremities.  Gait, station and balance: He stands easily. No veering to one side is noted. No leaning to one side is noted. Posture is age-appropriate and stance is narrow based. Gait shows normal stride length and normal pace. No problems turning are noted. Tandem walk is unremarkable.   Assessment and plan:  In summary, Derrick Becker is a very pleasant 71 y.o.-year old male with an underlying medical history of type 2 diabetes, hypertension, hyperlipidemia and obesity, who Presents for follow-up consultation of his severe obstructive sleep apnea as determined by a HST on 08/15/2015. Unfortunately, he did not have an attended sleep study or CPAP titration study. He has been on AutoPap therapy and has been compliant with it. His residual AHI is low. His leak is acceptable. He is using nasal pillows. Exam is stable. He is advised to be fully compliant with AutoPap therapy and we will go ahead and request a change from auto Pap to CPAP of 10 cm. He is agreeable. I prescribed a chinstrap as well. He can try this. Generally speaking, he is tolerating the nasal pillows. He is advised to work on his diabetes control and weight management. We talked about maintaining a healthy lifestyle in general. He is also advised to follow-up with his new  primary care physician. I suggested a one-year checkup, he can see one of our nurse practitioners at the time. I answered all his questions today and he was in agreement.  I spent 20 minutes in total face-to-face time with the patient, more than 50% of which was spent in counseling and coordination of care, reviewing test results, reviewing medication and discussing or reviewing the diagnosis of OSA, its prognosis and treatment options. Pertinent laboratory and imaging test results that were available during this visit with the patient were reviewed by me and considered in my medical decision making (see chart for details).

## 2017-01-25 NOTE — Patient Instructions (Signed)
As discussed, we will change from autoPAP to a set pressure to 10 cm.  I will also prescribe a chin strap for you to try.   Please continue using your CPAP regularly. While your insurance requires that you use CPAP at least 4 hours each night on 70% of the nights, I recommend, that you not skip any nights and use it throughout the night if you can. Getting used to CPAP and staying with the treatment long term does take time and patience and discipline. Untreated obstructive sleep apnea when it is moderate to severe can have an adverse impact on cardiovascular health and raise her risk for heart disease, arrhythmias, hypertension, congestive heart failure, stroke and diabetes. Untreated obstructive sleep apnea causes sleep disruption, nonrestorative sleep, and sleep deprivation. This can have an impact on your day to day functioning and cause daytime sleepiness and impairment of cognitive function, memory loss, mood disturbance, and problems focussing. Using CPAP regularly can improve these symptoms. We can see you in 1 year, you can see one of our nurse practitioners as you are stable. I will see you after that.

## 2017-02-01 ENCOUNTER — Telehealth: Payer: Self-pay | Admitting: Neurology

## 2017-02-01 ENCOUNTER — Ambulatory Visit (INDEPENDENT_AMBULATORY_CARE_PROVIDER_SITE_OTHER): Payer: Managed Care, Other (non HMO)

## 2017-02-01 ENCOUNTER — Encounter: Payer: Self-pay | Admitting: Physician Assistant

## 2017-02-01 ENCOUNTER — Ambulatory Visit (INDEPENDENT_AMBULATORY_CARE_PROVIDER_SITE_OTHER): Payer: Managed Care, Other (non HMO) | Admitting: Physician Assistant

## 2017-02-01 VITALS — BP 131/78 | HR 107 | Temp 98.2°F | Resp 17 | Ht 66.0 in | Wt 201.0 lb

## 2017-02-01 DIAGNOSIS — M25461 Effusion, right knee: Secondary | ICD-10-CM | POA: Diagnosis not present

## 2017-02-01 DIAGNOSIS — M25561 Pain in right knee: Secondary | ICD-10-CM

## 2017-02-01 DIAGNOSIS — G4733 Obstructive sleep apnea (adult) (pediatric): Secondary | ICD-10-CM

## 2017-02-01 DIAGNOSIS — Z9989 Dependence on other enabling machines and devices: Principal | ICD-10-CM

## 2017-02-01 LAB — POCT CBC
Granulocyte percent: 69.7 %G (ref 37–80)
HEMATOCRIT: 39.7 % — AB (ref 43.5–53.7)
HEMOGLOBIN: 14 g/dL — AB (ref 14.1–18.1)
LYMPH, POC: 1.7 (ref 0.6–3.4)
MCH, POC: 32.8 pg — AB (ref 27–31.2)
MCHC: 35.3 g/dL (ref 31.8–35.4)
MCV: 92.9 fL (ref 80–97)
MID (cbc): 1 — AB (ref 0–0.9)
MPV: 6.4 fL (ref 0–99.8)
POC GRANULOCYTE: 6.1 (ref 2–6.9)
POC LYMPH %: 19.3 % (ref 10–50)
POC MID %: 11 %M (ref 0–12)
Platelet Count, POC: 298 10*3/uL (ref 142–424)
RBC: 4.28 M/uL — AB (ref 4.69–6.13)
RDW, POC: 14.2 %
WBC: 8.7 10*3/uL (ref 4.6–10.2)

## 2017-02-01 LAB — POCT SEDIMENTATION RATE: POCT SED RATE: 36 mm/h — AB (ref 0–22)

## 2017-02-01 MED ORDER — NAPROXEN 500 MG PO TABS: 500.0000 mg | ORAL_TABLET | Freq: Two times a day (BID) | ORAL | 0 refills | Status: DC

## 2017-02-01 MED ORDER — TRAMADOL HCL 50 MG PO TABS
25.0000 mg | ORAL_TABLET | Freq: Three times a day (TID) | ORAL | 0 refills | Status: DC | PRN
Start: 2017-02-01 — End: 2018-07-12

## 2017-02-01 NOTE — Patient Instructions (Addendum)
  Keep the leg elevated when possible.  Okay to try and go back to work on Thursday.  Ice the knee for 20 mins three times daily with the ace wrap in place.    IF you received an x-ray today, you will receive an invoice from [facility]. Please contact [facility] at [phone] with questions or concerns regarding your invoice.   IF you received labwork today, you will receive an invoice from [facility]. Please contact [facility] at [phone] with questions or concerns regarding your invoice.   Our billing staff will not be able to assist you with questions regarding bills from these companies.  You will be contacted with the lab results as soon as they are available. The fastest way to get your results is to activate your My Chart account. Instructions are located on the last page of this paperwork. If you have not heard from [place] regarding the results in 2 weeks, please contact this office.

## 2017-02-01 NOTE — Telephone Encounter (Signed)
Pt called said new setting is making his mouth dry all the time. He is wanting it set back to automatic. Please call

## 2017-02-01 NOTE — Progress Notes (Signed)
02/02/2017 8:34 AM   DOB: [birth year] / MRN: [id number]  SUBJECTIVE:  Derrick Becker is a 71 y.o. male presenting for right knee pain that has been sore for about 1 week and suddenly worsened on Saturday. Walking make the pain worse.  Tells me today that he is having a hard time bearing weight on the joint.  Has been taking Ibuprofen 3-4 daily with mild relief.  Has also tried colchicine. Denies any previous history of knee pain.   He has No Known Allergies.   He  has a past medical history of Diabetes mellitus without complication ([place]); Hyperlipemia; and Hypertension.    He  reports that he has never smoked. He has never used smokeless tobacco. He reports that he drinks alcohol. He reports that he does not use drugs. He  reports that he does not engage in sexual activity. The patient  has a past surgical history that includes Fracture surgery.  His family history includes Kidney cancer in his brother; Lung cancer in his father.  Review of Systems  Constitutional: Negative for chills, diaphoresis and fever.  Gastrointestinal: Negative for nausea.  Musculoskeletal: Positive for joint pain. Negative for back pain and falls.  Skin: Negative for rash.  Neurological: Negative for dizziness.    The problem list and medications were reviewed and updated by myself where necessary and exist elsewhere in the encounter.   OBJECTIVE:  BP 131/78 (BP Location: Right Arm, Patient Position: Sitting, Cuff Size: Large)   Pulse (!) 107   Temp 98.2 F (36.8 C) (Oral)   Resp 17   Ht 5\' 6"  (1.676 m)   Wt 201 lb (91.2 kg)   SpO2 94%   BMI 32.44 kg/m   Physical Exam  Constitutional: He appears well-developed. He is active and cooperative.  Non-toxic appearance.  Cardiovascular: Normal rate.   Pulmonary/Chest: Effort normal. No tachypnea.  Musculoskeletal:       Legs: Neurological: He is alert.  Skin: Skin is warm and dry. He is not diaphoretic. No pallor.  Vitals reviewed.  Lab Results    Component Value Date   CREATININE 0.83 02/17/2016     Results for orders placed or performed in visit on 02/01/17 (from the past 72 hour(s))  POCT CBC     Status: Abnormal   Collection Time: 02/01/17 11:32 AM  Result Value Ref Range   WBC 8.7 4.6 - 10.2 K/uL   Lymph, poc 1.7 0.6 - 3.4   POC LYMPH PERCENT 19.3 10 - 50 %L   MID (cbc) 1.0 (A) 0 - 0.9   POC MID % 11.0 0 - 12 %M   POC Granulocyte 6.1 2 - 6.9   Granulocyte percent 69.7 37 - 80 %G   RBC 4.28 (A) 4.69 - 6.13 M/uL   Hemoglobin 14.0 (A) 14.1 - 18.1 g/dL   HCT, POC 39.7 (A) 43.5 - 53.7 %   MCV 92.9 80 - 97 fL   MCH, POC 32.8 (A) 27 - 31.2 pg   MCHC 35.3 31.8 - 35.4 g/dL   RDW, POC 14.2 %   Platelet Count, POC 298 142 - 424 K/uL   MPV 6.4 0 - 99.8 fL  POCT SEDIMENTATION RATE     Status: Abnormal   Collection Time: 02/01/17 12:33 PM  Result Value Ref Range   POCT SED RATE 36 (A) 0 - 22 mm/hr   Lab Results  Component Value Date   HGBA1C 9.0 02/17/2016     Dg Knee Complete 4 Views Right  Result Date: 02/01/2017 CLINICAL DATA:  71 year old male with knee pain and swelling for 1 week. Knee joint effusion suspected. EXAM: RIGHT KNEE - COMPLETE 4+ VIEW COMPARISON:  None. FINDINGS: Bone mineralization is within normal limits for age. The lateral and patellofemoral compartment joint spaces are preserved. There is mild medial compartment joint space loss. There is mild patellofemoral compartment degenerative spurring. Indistinct appearance of the suprapatellar soft tissues compatible with small joint effusion. No acute osseous abnormality identified. Mild calcified femoral artery atherosclerosis. IMPRESSION: 1. Small right knee joint effusion suspected. Mild for age medial and patellofemoral compartment degeneration with no acute osseous abnormality identified. 2. Mild calcified femoral artery atherosclerosis. Electronically Signed   By: Genevie Ann M.D.   On: 02/01/2017 11:43    ASSESSMENT AND PLAN:  [name] was seen today for knee  pain.  Diagnoses and all orders for this visit:  Pain and swelling of right knee: Small effusion on rads.  Given sed rate advising two full days of colchicine.  Ace wrap and elevation for at least 48 hours with intermitent ice.  Will see him back in about two weeks if not improving.   -     DG Knee Complete 4 Views Right; Future -     POCT CBC -     POCT SEDIMENTATION RATE    The patient is advised to call or return to clinic if he does not see an improvement in symptoms, or to seek the care of the closest emergency department if he worsens with the above plan.   Philis Fendt, MHS, PA-C [facility] 02/02/2017 8:34 AM

## 2017-02-02 ENCOUNTER — Encounter: Payer: Self-pay | Admitting: Family Medicine

## 2017-02-02 ENCOUNTER — Ambulatory Visit (INDEPENDENT_AMBULATORY_CARE_PROVIDER_SITE_OTHER): Payer: Managed Care, Other (non HMO) | Admitting: Family Medicine

## 2017-02-02 VITALS — BP 128/62 | HR 90 | Temp 97.8°F | Resp 17 | Ht 66.0 in | Wt 202.0 lb

## 2017-02-02 DIAGNOSIS — Z125 Encounter for screening for malignant neoplasm of prostate: Secondary | ICD-10-CM

## 2017-02-02 DIAGNOSIS — E119 Type 2 diabetes mellitus without complications: Secondary | ICD-10-CM

## 2017-02-02 DIAGNOSIS — Z0000 Encounter for general adult medical examination without abnormal findings: Secondary | ICD-10-CM

## 2017-02-02 DIAGNOSIS — I10 Essential (primary) hypertension: Secondary | ICD-10-CM | POA: Diagnosis not present

## 2017-02-02 DIAGNOSIS — E785 Hyperlipidemia, unspecified: Secondary | ICD-10-CM | POA: Diagnosis not present

## 2017-02-02 DIAGNOSIS — Z23 Encounter for immunization: Secondary | ICD-10-CM | POA: Diagnosis not present

## 2017-02-02 MED ORDER — ATORVASTATIN CALCIUM 80 MG PO TABS: 80.0000 mg | ORAL_TABLET | Freq: Every day | ORAL | 1 refills | Status: DC

## 2017-02-02 MED ORDER — SITAGLIPTIN PHOSPHATE 100 MG PO TABS: 100.0000 mg | ORAL_TABLET | Freq: Every day | ORAL | 1 refills | Status: DC

## 2017-02-02 MED ORDER — ZOSTER VAC RECOMB ADJUVANTED 50 MCG/0.5ML IM SUSR: 0.5000 mL | Freq: Once | INTRAMUSCULAR | 1 refills | Status: AC

## 2017-02-02 MED ORDER — AMLODIPINE BESYLATE 10 MG PO TABS: 10.0000 mg | ORAL_TABLET | Freq: Every day | ORAL | 1 refills | Status: DC

## 2017-02-02 MED ORDER — LOSARTAN POTASSIUM-HCTZ 100-25 MG PO TABS: ORAL_TABLET | ORAL | 1 refills | Status: DC

## 2017-02-02 MED ORDER — GLIPIZIDE 10 MG PO TABS: ORAL_TABLET | ORAL | 1 refills | Status: DC

## 2017-02-02 MED ORDER — METFORMIN HCL 1000 MG PO TABS: ORAL_TABLET | ORAL | 1 refills | Status: DC

## 2017-02-02 NOTE — Progress Notes (Signed)
By signing my name below, I, Derrick Becker, attest that this documentation has been prepared under the direction and in the presence of Derrick Ray, MD.  Electronically Signed: Verlee Monte, Medical Scribe. 02/02/17. 9:29 AM.  Subjective:    Patient ID: Derrick Becker, male    DOB: [birth year], 71 y.o.   MRN: [id number]  HPI Chief Complaint  Patient presents with  . Annual Exam    HPI Comments: Derrick Becker is a 71 y.o. male who presents to the Primary Care at [facility] for his complete physical. He has not been seen by me since 2013. He had been followed by Dr. Linna Darner up until recently.  HTN: Takes hyzaar 100/25 mg QD, and caduet 10/80 mg QD. Pt is taking caduet every other day. Reports experiencing finger and ankle swelling as well as finger "popping/cracking" when he was taking caduet daily, but stopped experiencing these sxs after taking it every other day. Pt would like to separate his medication. Denies ankle swelling and myalgias. Lab Results  Component Value Date   CREATININE 0.83 02/17/2016   BP Readings from Last 3 Encounters:  02/02/17 (!) 159/69  02/01/17 131/78  01/25/17 139/71   HLD: He takes caduet. Pt was taking caduet just about everyday when his cholesterol was checked in 02/2016. See above for side effects and compliancy. Denies myalgias. Lab Results  Component Value Date   CHOL 147 02/17/2016   HDL 37 (L) 02/17/2016   LDLCALC 97 02/17/2016   TRIG 63 02/17/2016   CHOLHDL 4.0 02/17/2016   Lab Results  Component Value Date   ALT 21 02/17/2016   AST 17 02/17/2016   ALKPHOS 56 02/17/2016   BILITOT 0.5 02/17/2016   DM: Takes glipizide 10 mg BID, januvia 100 mg QD, and metformin 1000 mg BID. uncontrolled DM of May 2017. Januvia was added at that time, also advised diet and exercise changes. Pt ran out of [place] since Dec 2017 due to insurance difficulties - needs 3 month rx. Denies experiencing any difficulties with [place] or  his other medications. Pt's blood sugar was 144 at 6am this morning, and it's usually higher than that. Pt works late, causing him to eat late. He reports soon retirement and suspects he can control his DM after that time.  Lab Results  Component Value Date   HGBA1C 9.0 02/17/2016   Lab Results  Component Value Date   MICROALBUR 18.2 (H) 12/22/2014   Wt Readings from Last 3 Encounters:  02/02/17 202 lb (91.6 kg)  02/01/17 201 lb (91.2 kg)  01/25/17 207 lb (93.9 kg)   Gout: He was seen in July with ankle swelling; sprain vs gout flare. Treated with colchicine ad ibuprofen. He is not on daily medication. He has not had any recent flare but suspects his knee pain is gout related. Lab Results  Component Value Date   LABURIC 6.0 04/13/2016   Knee Pain: See office visit yesterday with Philis Fendt, PA-C. See above.  Cancer Screening: Prostate: Pt agrees to DRE and blood work for CA screening. No results found for: PSA Colon: Colonoscopy Dec 2016; 2 polyps removed. Repeat in 5 years.  Immunizations: Pt has not had the PNA vaccine and would like to get it today. Pt would like to check the cost of the shingles vaccine. Deferred Tdap vaccine. Immunization History  Administered Date(s) Administered  . Influenza [place] 08/23/2012   Hearing/Vision: Is followed by a ophthalmologist with last visit in Jan 2018. He had  slight signs of cataracts that's been going on for 6-7 years, not diabetic changes found. Denies hearing problems.  Visual Acuity Screening   Right eye Left eye Both eyes  Without correction: 20/25 20/40 20/25  With correction:      Dentist: Pt has a partial implant. Is not followed by a dentist.   Exercise: Pt has a active job and he works 10 days at work. Currently limited by his knee injury. Home Exercise  02/02/2017  Current Exercise Habits The patient has a physically strenous job, but has no regular exercise apart from work.  Exercise limited by: orthopedic  condition(s)   Advance Directive: Pt does not have a living will. Full code.  Depression Screening: Depression screen [facility] 2/9 02/02/2017 02/01/2017 04/13/2016 02/17/2016 05/30/2015  Decreased Interest 0 0 0 0 0  Down, Depressed, Hopeless 0 0 0 0 0  PHQ - 2 Score 0 0 0 0 0   Fall Screening: Fall Risk  02/02/2017 02/01/2017 04/13/2016 02/17/2016  Falls in the past year? No No No No   Functional Status Survey: Is the patient deaf or have difficulty hearing?: No Does the patient have difficulty seeing, even when wearing glasses/contacts?: No Does the patient have difficulty concentrating, remembering, or making decisions?: No Does the patient have difficulty walking or climbing stairs?: No Does the patient have difficulty dressing or bathing?: No Does the patient have difficulty doing errands alone such as visiting a doctor's office or shopping?: No  Patient Active Problem List   Diagnosis Date Noted  . Hyperlipidemia 10/12/2013  . Diabetes ([place]) 08/23/2012  . HTN (hypertension) 08/23/2012   Past Medical History:  Diagnosis Date  . Diabetes mellitus without complication ([place])   . Hyperlipemia   . Hypertension    Past Surgical History:  Procedure Laterality Date  . FRACTURE SURGERY     R forearm   No Known Allergies Prior to Admission medications   Medication Sig Start Date End Date Taking? Authorizing Provider  amLODipine-atorvastatin (CADUET) 10-80 MG tablet Take 1 tablet by mouth daily. 02/17/16   Posey Boyer, MD  colchicine 0.6 MG tablet Take 2 pills initially with some food, then 1 twice daily. When doing better can taper to once daily for a few days and then discontinue. 04/13/16   Posey Boyer, MD  glipiZIDE (GLUCOTROL) 10 MG tablet TAKE ONE TWICE DAILY FOR DIABETES 02/17/16   Posey Boyer, MD  JANUVIA 100 MG tablet TAKE 1 TABLET (100 MG TOTAL) BY MOUTH DAILY. 08/05/16   Jaynee Eagles, PA-C  losartan-hydrochlorothiazide (HYZAAR) 100-25 MG tablet TAKE ONE EACH MORNING FOR BLOOD  PRESSURE 02/17/16   Posey Boyer, MD  metFORMIN (GLUCOPHAGE) 1000 MG tablet TAKE ONE TABLET BY MOUTH TWICE DAILY FOR DIABETES. 02/17/16   Posey Boyer, MD  naproxen (NAPROSYN) 500 MG tablet Take 1 tablet (500 mg total) by mouth 2 (two) times daily with a meal. 02/01/17   Tereasa Coop, PA-C  traMADol (ULTRAM) 50 MG tablet Take 0.5-1 tablets (25-50 mg total) by mouth every 8 (eight) hours as needed for severe pain. 02/01/17   Tereasa Coop, PA-C   Social History   Social History  . Marital status: Divorced    Spouse name: N/A  . Number of children: 2  . Years of education: College   Occupational History  . Not on file.   Social History Main Topics  . Smoking status: Never Smoker  . Smokeless tobacco: Never Used  . Alcohol use 0.0 oz/week  .  Drug use: No  . Sexual activity: No   Other Topics Concern  . Not on file   Social History Narrative  . No narrative on file   Review of Systems  13 point ROS negative. Objective:  Physical Exam  Constitutional: He is oriented to person, place, and time. He appears well-developed and well-nourished.  HENT:  Head: Normocephalic and atraumatic.  Right Ear: External ear normal.  Left Ear: External ear normal.  Mouth/Throat: Oropharynx is clear and moist.  Eyes: Conjunctivae and EOM are normal. Pupils are equal, round, and reactive to light.  Neck: Normal range of motion. Neck supple. No thyromegaly present.  Cardiovascular: Normal rate, regular rhythm, normal heart sounds and intact distal pulses.   Pulmonary/Chest: Effort normal and breath sounds normal. No respiratory distress. He has no wheezes.  Abdominal: Soft. He exhibits no distension. There is no tenderness. Hernia confirmed negative in the right inguinal area and confirmed negative in the left inguinal area.  Genitourinary: Prostate normal.  Musculoskeletal: Normal range of motion. He exhibits no edema or tenderness.  Lymphadenopathy:    He has no cervical adenopathy.    Neurological: He is alert and oriented to person, place, and time. He has normal reflexes.  Skin: Skin is warm and dry.  Psychiatric: He has a normal mood and affect. His behavior is normal.  Vitals reviewed.    Vitals:   02/02/17 0906 02/02/17 1013  BP: (!) 159/69 128/62  Pulse: 90   Resp: 17   Temp: 97.8 F (36.6 C)   TempSrc: Oral   SpO2: 97%   Weight: 202 lb (91.6 kg)   Height: 5' 6" (1.676 m)    Body mass index is 32.6 kg/m. Assessment & Plan:    TZION WEDEL is a 71 y.o. male Medicare annual wellness visit, subsequent  -  - anticipatory guidance as below in AVS, screening labs if needed. Health maintenance items as above in HPI discussed/recommended as applicable.   - no concerning responses on depression, fall, or functional status screening. Any positive responses noted as above. Advanced directives discussed as in CHL.   Type 2 diabetes mellitus without complication, without long-term current use of insulin (HCC) - Plan: Microalbumin, urine, Hemoglobin A1c, metFORMIN (GLUCOPHAGE) 1000 MG tablet, sitaGLIPtin (JANUVIA) 100 MG tablet, glipiZIDE (GLUCOTROL) 10 MG tablet  - check A1c, continue same med doses for now - specifically Restarting Januvia.  Essential hypertension - Plan: losartan-hydrochlorothiazide (HYZAAR) 100-25 MG tablet, amLODipine (NORVASC) 10 MG tablet  - Okay on recheck. No change in med doses for now. Did separate the amlodipine from the atorvastatin as requested, but no appreciable swelling noted on exam. If persistent ankle swelling, consider lower dose of amlodipine or adjusting to other antihypertensive.  Hyperlipidemia, unspecified hyperlipidemia type - Plan: Comprehensive metabolic panel, Lipid panel, atorvastatin (LIPITOR) 80 MG tablet  - Continue Lipitor 80 mg daily, labs pending. Unlikely cause of popping in hand joints. If persistent myalgias, consider change in dose. Advised to return if popping sensation in hands returns.  Screening for  prostate cancer - Plan: PSA  - We discussed pros and cons of prostate cancer screening, and after this discussion, he chose to have screening done. PSA obtained, and no concerning findings on DRE.   Need for prophylactic vaccination against Streptococcus pneumoniae (pneumococcus) - Plan: Pneumococcal conjugate vaccine 13-valent IM  Need for shingles vaccine - Plan: Zoster Vac Recomb Adjuvanted ([place]) injection  - rx printed for pharmacy to fill if not cost prohibitive.   Meds  ordered this encounter  Medications  . metFORMIN (GLUCOPHAGE) 1000 MG tablet    Sig: TAKE ONE TABLET BY MOUTH TWICE DAILY FOR DIABETES.    Dispense:  180 tablet    Refill:  1  . losartan-hydrochlorothiazide (HYZAAR) 100-25 MG tablet    Sig: TAKE ONE EACH MORNING FOR BLOOD PRESSURE    Dispense:  90 tablet    Refill:  1  . sitaGLIPtin (JANUVIA) 100 MG tablet    Sig: Take 1 tablet (100 mg total) by mouth daily.    Dispense:  90 tablet    Refill:  1  . glipiZIDE (GLUCOTROL) 10 MG tablet    Sig: TAKE ONE TWICE DAILY FOR DIABETES    Dispense:  180 tablet    Refill:  1  . amLODipine (NORVASC) 10 MG tablet    Sig: Take 1 tablet (10 mg total) by mouth daily.    Dispense:  90 tablet    Refill:  1  . atorvastatin (LIPITOR) 80 MG tablet    Sig: Take 1 tablet (80 mg total) by mouth daily at 6 PM.    Dispense:  90 tablet    Refill:  1  . Zoster Vac Recomb Adjuvanted [facility]) injection    Sig: Inject 0.5 mLs into the muscle once. Repeat injection once in 2-6 months.    Dispense:  0.5 mL    Refill:  1   Patient Instructions   You should take amlodipine and atorvastatin each day. If you notice more ankle swelling - return to discuss med changes. Follow up in next month to discuss these meds further and to recheck blood pressure.   Restart Januvia once per day.   If return of gout in next 3 months, recommend another gout blood test.   Prevnar today, other pneumonia vaccine at next visit.   Shingles vaccine  printed - check into cost and have performed at your pharmacy.     Keeping you healthy  Get these tests  Blood pressure- Have your blood pressure checked once a year by your healthcare provider.  Normal blood pressure is 120/80  Weight- Have your body mass index (BMI) calculated to screen for obesity.  BMI is a measure of body fat based on height and weight. You can also calculate your own BMI at [email or web address].[email or web address].  Cholesterol- Have your cholesterol checked every year.  Diabetes- Have your blood sugar checked regularly if you have high blood pressure, high cholesterol, have a family history of diabetes or if you are overweight.  Screening for Colon Cancer- Colonoscopy starting at age 73.  Screening may begin sooner depending on your family history and other health conditions. Follow up colonoscopy as directed by your Gastroenterologist.  Screening for Prostate Cancer- Both blood work (PSA) and a rectal exam help screen for Prostate Cancer.  Screening begins at age 52 with African-American men and at age 24 with Caucasian men.  Screening may begin sooner depending on your family history.  Take these medicines  Aspirin- One aspirin daily can help prevent Heart disease and Stroke.  Flu shot- Every fall.  Tetanus- Every 10 years.  Zostavax- Once after the age of 42 to prevent Shingles.  Pneumonia shot- Once after the age of 64; if you are younger than 65, ask your healthcare provider if you need a Pneumonia shot.  Take these steps  Don't smoke- If you do smoke, talk to your doctor about quitting.  For tips on how to quit, go to www.smokefree.gov or call 1-800-QUIT-NOW.  Be  physically active- Exercise 5 days a week for at least 30 minutes.  If you are not already physically active start slow and gradually work up to 30 minutes of moderate physical activity.  Examples of moderate activity include walking briskly, mowing the yard, dancing, swimming, bicycling, etc.  Eat a  healthy diet- Eat a variety of healthy food such as fruits, vegetables, low fat milk, low fat cheese, yogurt, lean meant, poultry, fish, beans, tofu, etc. For more information go to www.thenutritionsource.org  Drink alcohol in moderation- Limit alcohol intake to less than two drinks a day. Never drink and drive.  Dentist- Brush and floss twice daily; visit your dentist twice a year.  Depression- Your emotional health is as important as your physical health. If you're feeling down, or losing interest in things you would normally enjoy please talk to your healthcare provider.  Eye exam- Visit your eye doctor every year.  Safe sex- If you may be exposed to a sexually transmitted infection, use a condom.  Seat belts- Seat belts can save your life; always wear one.  Smoke/Carbon Monoxide detectors- These detectors need to be installed on the appropriate level of your home.  Replace batteries at least once a year.  Skin cancer- When out in the sun, cover up and use sunscreen 15 SPF or higher.  Violence- If anyone is threatening you, please tell your healthcare provider.  Living Will/ Health care power of attorney- Speak with your healthcare provider and family. Diabetes Mellitus and Standards of Medical Care Managing diabetes (diabetes mellitus) can be complicated. Your diabetes treatment may be managed by a team of health care providers, including:  A diet and nutrition specialist (registered dietitian).  A nurse.  A certified diabetes educator (CDE).  A diabetes specialist (endocrinologist).  An eye doctor.  A primary care provider.  A dentist. Your health care providers follow a schedule in order to help you get the best quality of care. The following schedule is a general guideline for your diabetes management plan. Your health care providers may also give you more specific instructions. HbA1c ( hemoglobin A1c) test This test provides information about blood sugar (glucose)  control over the previous 2-3 months. It is used to check whether your diabetes management plan needs to be adjusted.  If you are meeting your treatment goals, this test is done at least 2 times a year.  If you are not meeting treatment goals or if your treatment goals have changed, this test is done 4 times a year. Blood pressure test  This test is done at every routine medical visit. For most people, the goal is less than 130/80. Ask your health care provider what your goal blood pressure should be. Dental and eye exams  Visit your dentist two times a year.  If you have type 1 diabetes, get an eye exam 3-5 years after you are diagnosed, and then once a year after your first exam.  If you were diagnosed with type 1 diabetes as a child, get an eye exam when you are age 32 or older and have had diabetes for 3-5 years. After the first exam, you should get an eye exam once a year.  If you have type 2 diabetes, have an eye exam as soon as you are diagnosed, and then once a year after your first exam. Foot care exam  Visual foot exams are done at every routine medical visit. The exams check for cuts, bruises, redness, blisters, sores, or other problems with the  feet.  A complete foot exam is done by your health care provider once a year. This exam includes an inspection of the structure and skin of your feet, and a check of the pulses and sensation in your feet.  Type 1 diabetes: Get your first exam 3-5 years after diagnosis.  Type 2 diabetes: Get your first exam as soon as you are diagnosed.  Check your feet every day for cuts, bruises, redness, blisters, or sores. If you have any of these or other problems that are not healing, contact your health care provider. Kidney function test ( urine microalbumin)  This test is done once a year.  Type 1 diabetes: Get your first test 5 years after diagnosis.  Type 2 diabetes: Get your first test as soon as you are diagnosed.  If you have  chronic kidney disease (CKD), get a serum creatinine and estimated glomerular filtration rate (eGFR) test once a year. Lipid profile (cholesterol, HDL, LDL, triglycerides)  This test should be done when you are diagnosed with diabetes, and every 5 years after the first test. If you are on medicines to lower your cholesterol, you may need to get this test done every year.  The goal for LDL is less than 100 mg/dL (5.5 mmol/L). If you are at high risk, the goal is less than 70 mg/dL (3.9 mmol/L).    The goal for triglycerides is less than 150 mg/dL (8.3 mmol/L). Immunizations  The yearly flu (influenza) vaccine is recommended for everyone 6 months or older who has diabetes.  The pneumonia (pneumococcal) vaccine is recommended for everyone 2 years or older who has diabetes. If you are 56 or older, you may get the pneumonia vaccine as a series of two separate shots.  The hepatitis B vaccine is recommended for adults shortly after they have been diagnosed with diabetes.     Mental and emotional health  Screening for symptoms of eating disorders, anxiety, and depression is recommended at the time of diagnosis and afterward as needed. If your screening shows that you have symptoms (you have a positive screening result), you may need further evaluation and be referred to a mental health care provider. Diabetes self-management education  Education about how to manage your diabetes is recommended at diagnosis and ongoing as needed. Treatment plan  Your treatment plan will be reviewed at every medical visit. Summary  Managing diabetes (diabetes mellitus) can be complicated. Your diabetes treatment may be managed by a team of health care providers.  Your health care providers follow a schedule in order to help you get the best quality of care.  Standards of care including having regular physical exams, blood tests, blood pressure monitoring, immunizations, screening tests, and education about  how to manage your diabetes.  Your health care providers may also give you more specific instructions based on your individual health. This information is not intended to replace advice given to you by your health care provider. Make sure you discuss any questions you have with your health care provider. Document Released: 07/19/2009 Document Revised: 06/19/2016 Document Reviewed: 06/19/2016 Elsevier Interactive Patient Education  2017 [employer].   IF you received an x-Becker today, you will receive an invoice from [facility]. Please contact [facility] at [phone] with questions or concerns regarding your invoice.   IF you received labwork today, you will receive an invoice from [facility]. Please contact [facility] at [phone] with questions or concerns regarding your invoice.   Our billing staff will not be able to  assist you with questions regarding bills from these companies.  You will be contacted with the lab results as soon as they are available. The fastest way to get your results is to activate your My Chart account. Instructions are located on the last page of this paperwork. If you have not heard from [place] regarding the results in 2 weeks, please contact this office.       I personally performed the services described in this documentation, which was scribed in my presence. The recorded information has been reviewed and considered for accuracy and completeness, addended by me as needed, and agree with information above.  Signed,   Derrick Ray, MD [facility] [place].  02/03/17 8:15 AM

## 2017-02-02 NOTE — Telephone Encounter (Signed)
Per patient request, will change back from CPAP of 10 cm to autoPAP of 5-15 cm.

## 2017-02-02 NOTE — Telephone Encounter (Signed)
I spoke to patient. He states that the new setting of 10 is uncomfortable for him and causes him to have "extreme dry mouth". Patient asks if it can be changed back to the Auto setting 5-15.

## 2017-02-02 NOTE — Addendum Note (Signed)
Addended by: Star Age on: 02/02/2017 01:47 PM   Modules accepted: Orders

## 2017-02-02 NOTE — Telephone Encounter (Signed)
I spoke to patient and let him know that we are faxing orders to [place].

## 2017-02-02 NOTE — Patient Instructions (Addendum)
You should take amlodipine and atorvastatin each day. If you notice more ankle swelling - return to discuss med changes. Follow up in next month to discuss these meds further and to recheck blood pressure.   Restart Januvia once per day.   If return of gout in next 3 months, recommend another gout blood test.   Prevnar today, other pneumonia vaccine at next visit.   Shingles vaccine printed - check into cost and have performed at your pharmacy.     Keeping you healthy  Get these tests  Blood pressure- Have your blood pressure checked once a year by your healthcare provider.  Normal blood pressure is 120/80  Weight- Have your body mass index (BMI) calculated to screen for obesity.  BMI is a measure of body fat based on height and weight. You can also calculate your own BMI at ViewBanking.[email or web address].  Cholesterol- Have your cholesterol checked every year.  Diabetes- Have your blood sugar checked regularly if you have high blood pressure, high cholesterol, have a family history of diabetes or if you are overweight.  Screening for Colon Cancer- Colonoscopy starting at age 75.  Screening may begin sooner depending on your family history and other health conditions. Follow up colonoscopy as directed by your Gastroenterologist.  Screening for Prostate Cancer- Both blood work (PSA) and a rectal exam help screen for Prostate Cancer.  Screening begins at age 9 with African-American men and at age 103 with Caucasian men.  Screening may begin sooner depending on your family history.  Take these medicines  Aspirin- One aspirin daily can help prevent Heart disease and Stroke.  Flu shot- Every fall.  Tetanus- Every 10 years.  Zostavax- Once after the age of 56 to prevent Shingles.  Pneumonia shot- Once after the age of 65; if you are younger than 50, ask your healthcare provider if you need a Pneumonia shot.  Take these steps  Don't smoke- If you do smoke, talk to your doctor about  quitting.  For tips on how to quit, go to www.smokefree.gov or call 1-800-QUIT-NOW.  Be physically active- Exercise 5 days a week for at least 30 minutes.  If you are not already physically active start slow and gradually work up to 30 minutes of moderate physical activity.  Examples of moderate activity include walking briskly, mowing the yard, dancing, swimming, bicycling, etc.  Eat a healthy diet- Eat a variety of healthy food such as fruits, vegetables, low fat milk, low fat cheese, yogurt, lean meant, poultry, fish, beans, tofu, etc. For more information go to www.thenutritionsource.org  Drink alcohol in moderation- Limit alcohol intake to less than two drinks a day. Never drink and drive.  Dentist- Brush and floss twice daily; visit your dentist twice a year.  Depression- Your emotional health is as important as your physical health. If you're feeling down, or losing interest in things you would normally enjoy please talk to your healthcare provider.  Eye exam- Visit your eye doctor every year.  Safe sex- If you may be exposed to a sexually transmitted infection, use a condom.  Seat belts- Seat belts can save your life; always wear one.  Smoke/Carbon Monoxide detectors- These detectors need to be installed on the appropriate level of your home.  Replace batteries at least once a year.  Skin cancer- When out in the sun, cover up and use sunscreen 15 SPF or higher.  Violence- If anyone is threatening you, please tell your healthcare provider.  Living Will/ Health care power of attorney-  Speak with your healthcare provider and family. Diabetes Mellitus and Standards of Medical Care Managing diabetes (diabetes mellitus) can be complicated. Your diabetes treatment may be managed by a team of health care providers, including:  A diet and nutrition specialist (registered dietitian).  A nurse.  A certified diabetes educator (CDE).  A diabetes specialist (endocrinologist).  An eye  doctor.  A primary care provider.  A dentist. Your health care providers follow a schedule in order to help you get the best quality of care. The following schedule is a general guideline for your diabetes management plan. Your health care providers may also give you more specific instructions. HbA1c ( hemoglobin A1c) test This test provides information about blood sugar (glucose) control over the previous 2-3 months. It is used to check whether your diabetes management plan needs to be adjusted.  If you are meeting your treatment goals, this test is done at least 2 times a year.  If you are not meeting treatment goals or if your treatment goals have changed, this test is done 4 times a year. Blood pressure test  This test is done at every routine medical visit. For most people, the goal is less than 130/80. Ask your health care provider what your goal blood pressure should be. Dental and eye exams  Visit your dentist two times a year.  If you have type 1 diabetes, get an eye exam 3-5 years after you are diagnosed, and then once a year after your first exam.  If you were diagnosed with type 1 diabetes as a child, get an eye exam when you are age 108 or older and have had diabetes for 3-5 years. After the first exam, you should get an eye exam once a year.  If you have type 2 diabetes, have an eye exam as soon as you are diagnosed, and then once a year after your first exam. Foot care exam  Visual foot exams are done at every routine medical visit. The exams check for cuts, bruises, redness, blisters, sores, or other problems with the feet.  A complete foot exam is done by your health care provider once a year. This exam includes an inspection of the structure and skin of your feet, and a check of the pulses and sensation in your feet.  Type 1 diabetes: Get your first exam 3-5 years after diagnosis.  Type 2 diabetes: Get your first exam as soon as you are diagnosed.  Check your feet  every day for cuts, bruises, redness, blisters, or sores. If you have any of these or other problems that are not healing, contact your health care provider. Kidney function test ( urine microalbumin)  This test is done once a year.  Type 1 diabetes: Get your first test 5 years after diagnosis.  Type 2 diabetes: Get your first test as soon as you are diagnosed.  If you have chronic kidney disease (CKD), get a serum creatinine and estimated glomerular filtration rate (eGFR) test once a year. Lipid profile (cholesterol, HDL, LDL, triglycerides)  This test should be done when you are diagnosed with diabetes, and every 5 years after the first test. If you are on medicines to lower your cholesterol, you may need to get this test done every year.  The goal for LDL is less than 100 mg/dL (5.5 mmol/L). If you are at high risk, the goal is less than 70 mg/dL (3.9 mmol/L).    The goal for triglycerides is less than 150 mg/dL (8.3 mmol/L).  Immunizations  The yearly flu (influenza) vaccine is recommended for everyone 6 months or older who has diabetes.  The pneumonia (pneumococcal) vaccine is recommended for everyone 2 years or older who has diabetes. If you are 1 or older, you may get the pneumonia vaccine as a series of two separate shots.  The hepatitis B vaccine is recommended for adults shortly after they have been diagnosed with diabetes.     Mental and emotional health  Screening for symptoms of eating disorders, anxiety, and depression is recommended at the time of diagnosis and afterward as needed. If your screening shows that you have symptoms (you have a positive screening result), you may need further evaluation and be referred to a mental health care provider. Diabetes self-management education  Education about how to manage your diabetes is recommended at diagnosis and ongoing as needed. Treatment plan  Your treatment plan will be reviewed at every medical  visit. Summary  Managing diabetes (diabetes mellitus) can be complicated. Your diabetes treatment may be managed by a team of health care providers.  Your health care providers follow a schedule in order to help you get the best quality of care.  Standards of care including having regular physical exams, blood tests, blood pressure monitoring, immunizations, screening tests, and education about how to manage your diabetes.  Your health care providers may also give you more specific instructions based on your individual health. This information is not intended to replace advice given to you by your health care provider. Make sure you discuss any questions you have with your health care provider. Document Released: 07/19/2009 Document Revised: 06/19/2016 Document Reviewed: 06/19/2016 Elsevier Interactive Patient Education  2017 [employer].   IF you received an x-ray today, you will receive an invoice from [facility]. Please contact [facility] at [phone] with questions or concerns regarding your invoice.   IF you received labwork today, you will receive an invoice from [place]. Please contact [facility] at [phone] with questions or concerns regarding your invoice.   Our billing staff will not be able to assist you with questions regarding bills from these companies.  You will be contacted with the lab results as soon as they are available. The fastest way to get your results is to activate your My Chart account. Instructions are located on the last page of this paperwork. If you have not heard from [place] regarding the results in 2 weeks, please contact this office.

## 2017-02-03 ENCOUNTER — Encounter: Payer: Self-pay | Admitting: Family Medicine

## 2017-02-03 LAB — LIPID PANEL
CHOL/HDL RATIO: 4.1 ratio (ref 0.0–5.0)
Cholesterol, Total: 154 mg/dL (ref 100–199)
HDL: 38 mg/dL — AB (ref >39)
LDL Calculated: 98 mg/dL (ref 0–99)
TRIGLYCERIDES: 89 mg/dL (ref 0–149)
VLDL CHOLESTEROL CAL: 18 mg/dL (ref 5–40)

## 2017-02-03 LAB — COMPREHENSIVE METABOLIC PANEL
ALBUMIN: 4.8 g/dL (ref 3.5–4.8)
ALT: 23 IU/L (ref 0–44)
AST: 23 IU/L (ref 0–40)
Albumin/Globulin Ratio: 1.8 (ref 1.2–2.2)
Alkaline Phosphatase: 63 IU/L (ref 39–117)
BILIRUBIN TOTAL: 0.4 mg/dL (ref 0.0–1.2)
BUN / CREAT RATIO: 21 (ref 10–24)
BUN: 22 mg/dL (ref 8–27)
CHLORIDE: 97 mmol/L (ref 96–106)
CO2: 18 mmol/L (ref 18–29)
CREATININE: 1.03 mg/dL (ref 0.76–1.27)
Calcium: 9.5 mg/dL (ref 8.6–10.2)
GFR calc non Af Amer: 73 mL/min/{1.73_m2} (ref >59)
GFR, EST AFRICAN AMERICAN: 84 mL/min/{1.73_m2} (ref >59)
GLUCOSE: 163 mg/dL — AB (ref 65–99)
Globulin, Total: 2.6 g/dL (ref 1.5–4.5)
Potassium: 4.5 mmol/L (ref 3.5–5.2)
Sodium: 140 mmol/L (ref 134–144)
TOTAL PROTEIN: 7.4 g/dL (ref 6.0–8.5)

## 2017-02-03 LAB — HEMOGLOBIN A1C
ESTIMATED AVERAGE GLUCOSE: 209 mg/dL
Hgb A1c MFr Bld: 8.9 % — ABNORMAL HIGH (ref 4.8–5.6)

## 2017-02-03 LAB — MICROALBUMIN, URINE: Microalbumin, Urine: 35.5 ug/mL

## 2017-02-03 LAB — PSA: PROSTATE SPECIFIC AG, SERUM: 1.6 ng/mL (ref 0.0–4.0)

## 2017-02-06 ENCOUNTER — Other Ambulatory Visit: Payer: Self-pay | Admitting: Family Medicine

## 2017-02-06 DIAGNOSIS — E785 Hyperlipidemia, unspecified: Secondary | ICD-10-CM

## 2017-04-27 ENCOUNTER — Ambulatory Visit (INDEPENDENT_AMBULATORY_CARE_PROVIDER_SITE_OTHER): Payer: Commercial Managed Care - HMO | Admitting: Family Medicine

## 2017-04-27 ENCOUNTER — Encounter: Payer: Self-pay | Admitting: Family Medicine

## 2017-04-27 VITALS — BP 155/75 | HR 84 | Temp 98.8°F | Resp 17 | Ht 66.0 in | Wt 201.0 lb

## 2017-04-27 DIAGNOSIS — E119 Type 2 diabetes mellitus without complications: Secondary | ICD-10-CM | POA: Diagnosis not present

## 2017-04-27 DIAGNOSIS — S91209A Unspecified open wound of unspecified toe(s) with damage to nail, initial encounter: Secondary | ICD-10-CM

## 2017-04-27 DIAGNOSIS — I10 Essential (primary) hypertension: Secondary | ICD-10-CM

## 2017-04-27 DIAGNOSIS — E785 Hyperlipidemia, unspecified: Secondary | ICD-10-CM | POA: Diagnosis not present

## 2017-04-27 MED ORDER — METFORMIN HCL 1000 MG PO TABS: ORAL_TABLET | ORAL | 1 refills | Status: DC

## 2017-04-27 MED ORDER — LOSARTAN POTASSIUM-HCTZ 100-25 MG PO TABS: ORAL_TABLET | ORAL | 1 refills | Status: DC

## 2017-04-27 MED ORDER — AMLODIPINE-ATORVASTATIN 10-80 MG PO TABS: 1.0000 | ORAL_TABLET | Freq: Every day | ORAL | 1 refills | Status: DC

## 2017-04-27 MED ORDER — SITAGLIPTIN PHOSPHATE 100 MG PO TABS: 100.0000 mg | ORAL_TABLET | Freq: Every day | ORAL | 1 refills | Status: DC

## 2017-04-27 MED ORDER — GLIPIZIDE 10 MG PO TABS
ORAL_TABLET | ORAL | 1 refills | Status: DC
Start: 2017-04-27 — End: 2017-09-06

## 2017-04-27 NOTE — Progress Notes (Signed)
Subjective:  By signing my name below, I, Derrick Becker, attest that this documentation has been prepared under the direction and in the presence of Derrick Agreste, MD Electronically Signed: Ladene Artist, ED Scribe 04/27/2017 at 8:19 AM.   Patient ID: Derrick Becker, male    DOB: January 05, 1946, 71 y.o.   MRN: [id number]  Chief Complaint  Patient presents with  . diabetic check   HPI Derrick Becker is a 71 y.o. male who presents to Primary Care at [facility] for a follow-up on diabetes.   Lab Results  Component Value Date   HGBA1C 8.9 (H) 02/02/2017   Lab Results  Component Value Date   MICROALBUR 18.2 (H) 12/22/2014   Had been off of Januvia. Planned on continuing Januvia 100 mg qd, Metformin 1000 mg bid, Glipizide 10 mg bid and statin with Caduet. Optho: seen January 2018. Dentist: partial implants; not followed regularly by dentist. Pneumonia vaccination: Prevnar May 1.   Pt states that he never restarted Januvia since it was not covered when he went to the pharmacy, but he now has insurance. He reports a 30 day glucose average of 110. Pt does report a low reading around 49 one morning when he had not eaten for a few hours. He suspects that his glucose was elevated at May visit due to missed doses but states that he now sets an alarm to help with compliance. Pt denies chest pain, sob, difficulty breathing.   Patient Active Problem List   Diagnosis Date Noted  . Hyperlipidemia 10/12/2013  . Diabetes ([place]) 08/23/2012  . HTN (hypertension) 08/23/2012   Past Medical History:  Diagnosis Date  . Diabetes mellitus without complication ([place])   . Hyperlipemia   . Hypertension    Past Surgical History:  Procedure Laterality Date  . FRACTURE SURGERY     R forearm   No Known Allergies Prior to Admission medications   Medication Sig Start Date End Date Taking? Authorizing Provider  amLODipine (NORVASC) 10 MG tablet Take 1 tablet (10 mg total) by mouth daily. 02/02/17   Derrick Agreste, MD  amLODipine-atorvastatin (CADUET) 10-80 MG tablet TAKE 1 TABLET BY MOUTH DAILY. 02/06/17   Derrick Agreste, MD  atorvastatin (LIPITOR) 80 MG tablet Take 1 tablet (80 mg total) by mouth daily at 6 PM. 02/02/17   Derrick Agreste, MD  colchicine 0.6 MG tablet Take 2 pills initially with some food, then 1 twice daily. When doing better can taper to once daily for a few days and then discontinue. 04/13/16   Posey Boyer, MD  glipiZIDE (GLUCOTROL) 10 MG tablet TAKE ONE TWICE DAILY FOR DIABETES 02/02/17   Derrick Agreste, MD  losartan-hydrochlorothiazide [facility]) 100-25 MG tablet TAKE ONE EACH MORNING FOR BLOOD PRESSURE 02/02/17   Derrick Agreste, MD  metFORMIN (GLUCOPHAGE) 1000 MG tablet TAKE ONE TABLET BY MOUTH TWICE DAILY FOR DIABETES. 02/02/17   Derrick Agreste, MD  naproxen (NAPROSYN) 500 MG tablet Take 1 tablet (500 mg total) by mouth 2 (two) times daily with a meal. 02/01/17   Tereasa Coop, PA-C  sitaGLIPtin (JANUVIA) 100 MG tablet Take 1 tablet (100 mg total) by mouth daily. 02/02/17   Derrick Agreste, MD  traMADol (ULTRAM) 50 MG tablet Take 0.5-1 tablets (25-50 mg total) by mouth every 8 (eight) hours as needed for severe pain. 02/01/17   Tereasa Coop, PA-C   Social History   Social History  . Marital status: Divorced  Spouse name: N/A  . Number of children: 2  . Years of education: College   Occupational History  . Not on file.   Social History Main Topics  . Smoking status: Never Smoker  . Smokeless tobacco: Never Used  . Alcohol use 0.0 oz/week  . Drug use: No  . Sexual activity: No   Other Topics Concern  . Not on file   Social History Narrative  . No narrative on file   Review of Systems  Respiratory: Negative for shortness of breath.   Cardiovascular: Negative for chest pain.      Objective:   Physical Exam  Constitutional: He is oriented to person, place, and time. He appears well-developed and well-nourished.  HENT:  Head: Normocephalic  and atraumatic.  Eyes: Pupils are equal, round, and reactive to light. EOM are normal.  Neck: No JVD present. Carotid bruit is not present.  Cardiovascular: Normal rate, regular rhythm and normal heart sounds.   No murmur heard. Pulmonary/Chest: Effort normal and breath sounds normal. He has no rales.  Abdominal: Soft. There is no tenderness.  Musculoskeletal: He exhibits no edema.  Neurological: He is alert and oriented to person, place, and time.  Skin: Skin is warm and dry.  R great toe: lateral aspect appears to have a small break without signs of ingrown toenail.   Psychiatric: He has a normal mood and affect.  Vitals reviewed.  Vitals:   04/27/17 0804  BP: (!) 155/75  Pulse: 84  Resp: 17  Temp: 98.8 F (37.1 C)  TempSrc: Oral  SpO2: 98%  Weight: 201 lb (91.2 kg)  Height: 5\' 6"  (1.676 m)   Wt Readings from Last 3 Encounters:  04/27/17 201 lb (91.2 kg)  02/02/17 202 lb (91.6 kg)  02/01/17 201 lb (91.2 kg)      Assessment & Plan:   Derrick Becker is a 71 y.o. male Type 2 diabetes mellitus without complication, without long-term current use of insulin (HCC) - Plan: Hemoglobin A1c, metFORMIN (GLUCOPHAGE) 1000 MG tablet, glipiZIDE (GLUCOTROL) 10 MG tablet, sitaGLIPtin (JANUVIA) 100 MG tablet  - Now additional information that he may have missed some doses of glipizide at last visit. Home readings reportedly improves. Will check A1c in the next approximate 1 week (lab only visit) as he is slightly early for that today, then if elevated can restart Januvia. That prescription was printed.  -Stress importance of eating with glipizide to lessen chance of hypoglycemia  - Recheck in November with fasting lab work  Essential hypertension - Plan: losartan-hydrochlorothiazide (HYZAAR) 100-25 MG tablet  - Borderline elevated, controlled in May on recheck. continue same dose of losartan HCTZ, and amlodipine today, recheck at next visit.  Hyperlipidemia, unspecified hyperlipidemia type  - Plan: amLODipine-atorvastatin (CADUET) 10-80 MG tablet  - Tolerating atorvastatin, plan for lipids at November visit  Wound of toenail, initial encounter  - Appears to have a splint of his distal toenail without sign of ingrown toenail/infection, nontender. Symptomatic care, discussed that should  resolve within the next 6-8 weeks. RTC precautions  Meds ordered this encounter  Medications  . metFORMIN (GLUCOPHAGE) 1000 MG tablet    Sig: TAKE ONE TABLET BY MOUTH TWICE DAILY FOR DIABETES.    Dispense:  180 tablet    Refill:  1  . losartan-hydrochlorothiazide (HYZAAR) 100-25 MG tablet    Sig: TAKE ONE EACH MORNING FOR BLOOD PRESSURE    Dispense:  90 tablet    Refill:  1  . glipiZIDE (GLUCOTROL) 10 MG tablet  Sig: TAKE ONE TWICE DAILY FOR DIABETES    Dispense:  180 tablet    Refill:  1  . amLODipine-atorvastatin (CADUET) 10-80 MG tablet    Sig: Take 1 tablet by mouth daily.    Dispense:  90 tablet    Refill:  1  . sitaGLIPtin (JANUVIA) 100 MG tablet    Sig: Take 1 tablet (100 mg total) by mouth daily.    Dispense:  90 tablet    Refill:  1   Patient Instructions    Return after August 1st for hemoglobin A1c. I suspect you will need to be back on Januvia  - I will print this for you to fill if A1c is elevated.   Make sure you eat after taking glipizide to lessen chance of low blood sugars.   Follow up with me in November. Let me know if there are questions sooner.    You have a split in the toenail, but I do not see any concerns on the corner or into the skin of the toe. That should heal over the next 6 weeks as a new toenail grows out. If any redness, pain, or worsening symptoms return for recheck   IF you received an x-ray today, you will receive an invoice from [facility]. Please contact [facility] at [phone] with questions or concerns regarding your invoice.   IF you received labwork today, you will receive an invoice from [place]. Please contact  [facility] at [phone] with questions or concerns regarding your invoice.   Our billing staff will not be able to assist you with questions regarding bills from these companies.  You will be contacted with the lab results as soon as they are available. The fastest way to get your results is to activate your My Chart account. Instructions are located on the last page of this paperwork. If you have not heard from [place] regarding the results in 2 weeks, please contact this office.       I personally performed the services described in this documentation, which was scribed in my presence. The recorded information has been reviewed and considered for accuracy and completeness, addended by me as needed, and agree with information above.  Signed,   Merri Ray, MD Primary Care at [place].  04/27/17 8:42 AM

## 2017-04-27 NOTE — Patient Instructions (Addendum)
  Return after August 1st for hemoglobin A1c. I suspect you will need to be back on Januvia  - I will print this for you to fill if A1c is elevated.   Make sure you eat after taking glipizide to lessen chance of low blood sugars.   Follow up with me in November. Let me know if there are questions sooner.    You have a split in the toenail, but I do not see any concerns on the corner or into the skin of the toe. That should heal over the next 6 weeks as a new toenail grows out. If any redness, pain, or worsening symptoms return for recheck   IF you received an x-ray today, you will receive an invoice from [facility]. Please contact [facility]iology at [phone] with questions or concerns regarding your invoice.   IF you received labwork today, you will receive an invoice from [place]. Please contact [facility] at [phone] with questions or concerns regarding your invoice.   Our billing staff will not be able to assist you with questions regarding bills from these companies.  You will be contacted with the lab results as soon as they are available. The fastest way to get your results is to activate your My Chart account. Instructions are located on the last page of this paperwork. If you have not heard from [place] regarding the results in 2 weeks, please contact this office.

## 2017-05-05 ENCOUNTER — Ambulatory Visit (INDEPENDENT_AMBULATORY_CARE_PROVIDER_SITE_OTHER): Payer: Commercial Managed Care - HMO | Admitting: Family Medicine

## 2017-05-05 DIAGNOSIS — E119 Type 2 diabetes mellitus without complications: Secondary | ICD-10-CM

## 2017-05-05 LAB — HEMOGLOBIN A1C
ESTIMATED AVERAGE GLUCOSE: 160 mg/dL
Hgb A1c MFr Bld: 7.2 % — ABNORMAL HIGH (ref 4.8–5.6)

## 2017-05-06 ENCOUNTER — Telehealth: Payer: Self-pay | Admitting: Family Medicine

## 2017-05-06 MED ORDER — ATORVASTATIN CALCIUM 80 MG PO TABS: 80.0000 mg | ORAL_TABLET | Freq: Every day | ORAL | 1 refills | Status: DC

## 2017-05-06 MED ORDER — AMLODIPINE BESYLATE 10 MG PO TABS: 10.0000 mg | ORAL_TABLET | Freq: Every day | ORAL | 1 refills | Status: DC

## 2017-05-06 NOTE — Progress Notes (Signed)
Lab only visit. Pt not seen by a provider.

## 2017-05-06 NOTE — Telephone Encounter (Signed)
See note below. Please advise.  

## 2017-05-06 NOTE — Telephone Encounter (Signed)
PATIENT STATES HE WAS IN THE OFFICE AND SAW DR. Carlota Raspberry WED. (05/05/17). HE PRESCRIBED HIM TO HAVE AMALODIPINE-ATORVASTE 1080. THE PHARMACIST TOLD HIM THAT THE 1080 IS IN A TIER 4 AND WOULD BE ABOUT $400.00. HE TOLD MR. Karow THAT IF DR. GREENE SEPARATES IT Orthoptist) HIS INSURANCE WOULD COVER THE WHOLE THING. BEST PHONE [phone] (CELL) [place] MAIL-IN. Derrick Becker

## 2017-05-06 NOTE — Telephone Encounter (Signed)
Caduet change to individual amlodipine 10 mg daily, atorvastatin 80 mg daily. Sent to pharmacy as requested.

## 2017-05-06 NOTE — Telephone Encounter (Signed)
Please Advise pt.

## 2017-05-13 ENCOUNTER — Ambulatory Visit: Payer: Commercial Managed Care - HMO | Admitting: Family Medicine

## 2017-08-19 ENCOUNTER — Ambulatory Visit: Payer: Commercial Managed Care - HMO | Admitting: Family Medicine

## 2017-08-20 ENCOUNTER — Other Ambulatory Visit: Payer: Self-pay | Admitting: Family Medicine

## 2017-08-20 DIAGNOSIS — I10 Essential (primary) hypertension: Secondary | ICD-10-CM

## 2017-08-25 ENCOUNTER — Telehealth: Payer: Self-pay | Admitting: Family Medicine

## 2017-08-25 NOTE — Telephone Encounter (Unsigned)
Copied from [place] [phone]. Topic: Appointment Scheduling - Scheduling Inquiry for Clinic >> Aug 25, 2017 11:12 AM Yvette Rack wrote: Reason for CRM: patient called to cancell his appointment that he had on 09-02-2017 due to death in the family but reschedule his appointment for 6 month f/u on DM on 09-06-17

## 2017-08-27 ENCOUNTER — Ambulatory Visit: Payer: Commercial Managed Care - HMO | Admitting: Family Medicine

## 2017-09-06 ENCOUNTER — Other Ambulatory Visit: Payer: Self-pay

## 2017-09-06 ENCOUNTER — Ambulatory Visit: Payer: Medicare HMO | Admitting: Family Medicine

## 2017-09-06 ENCOUNTER — Encounter: Payer: Self-pay | Admitting: Family Medicine

## 2017-09-06 VITALS — BP 138/68 | HR 84 | Temp 97.8°F | Resp 18 | Ht 66.0 in | Wt 205.0 lb

## 2017-09-06 DIAGNOSIS — E785 Hyperlipidemia, unspecified: Secondary | ICD-10-CM

## 2017-09-06 DIAGNOSIS — Z23 Encounter for immunization: Secondary | ICD-10-CM | POA: Diagnosis not present

## 2017-09-06 DIAGNOSIS — Z1159 Encounter for screening for other viral diseases: Secondary | ICD-10-CM | POA: Diagnosis not present

## 2017-09-06 DIAGNOSIS — E119 Type 2 diabetes mellitus without complications: Secondary | ICD-10-CM

## 2017-09-06 DIAGNOSIS — I10 Essential (primary) hypertension: Secondary | ICD-10-CM | POA: Diagnosis not present

## 2017-09-06 MED ORDER — GLIPIZIDE 10 MG PO TABS
ORAL_TABLET | ORAL | 1 refills | Status: DC
Start: 2017-09-06 — End: 2017-12-07

## 2017-09-06 MED ORDER — ATORVASTATIN CALCIUM 80 MG PO TABS: 80.0000 mg | ORAL_TABLET | Freq: Every day | ORAL | 1 refills | Status: DC

## 2017-09-06 MED ORDER — AMLODIPINE BESYLATE 10 MG PO TABS: 10.0000 mg | ORAL_TABLET | Freq: Every day | ORAL | 1 refills | Status: DC

## 2017-09-06 MED ORDER — LOSARTAN POTASSIUM-HCTZ 100-25 MG PO TABS
ORAL_TABLET | ORAL | 1 refills | Status: DC
Start: 2017-09-06 — End: 2018-03-09

## 2017-09-06 MED ORDER — METFORMIN HCL 1000 MG PO TABS
ORAL_TABLET | ORAL | 1 refills | Status: DC
Start: 2017-09-06 — End: 2017-12-07

## 2017-09-06 NOTE — Patient Instructions (Addendum)
Flu vaccine given today. Return for tetanus as discussed.  Restart exercise most days per week, try to avoid sugar containing beverages such as Sweet tea's and sodas. Watch portion control to help with diet and weight loss. Depending on A1c, can decide on other medication if needed. For now continue glipizide and metformin same doses.  No change in other medications for now, blood pressure goal less than 130/80. If your readings run continuously higher, let me know so we can change medication.  Im sorry to hear about your loss.Please let me know if I can provide any resources for you in the time of grieving.  Recheck in 3 months for diabetes.    IF you received an x-ray today, you will receive an invoice from [facility]. Please contact [facility] Radiology at [phone] with questions or concerns regarding your invoice.   IF you received labwork today, you will receive an invoice from [place]. Please contact [facility] at [phone] with questions or concerns regarding your invoice.   Our billing staff will not be able to assist you with questions regarding bills from these companies.  You will be contacted with the lab results as soon as they are available. The fastest way to get your results is to activate your My Chart account. Instructions are located on the last page of this paperwork. If you have not heard from [place] regarding the results in 2 weeks, please contact this office.

## 2017-09-06 NOTE — Progress Notes (Signed)
Subjective:  By signing my name below, I, Moises Blood, attest that this documentation has been prepared under the direction and in the presence of Merri Ray, MD. Electronically Signed: Moises Blood, [place]. 09/06/2017 , 10:07 AM .  Patient was seen in Room 10 .   Patient ID: Derrick Becker, male    DOB: [birth year], 71 y.o.   MRN: [id number] Chief Complaint  Patient presents with  . Diabetes    4 month follow up    HPI Derrick Becker is a 71 y.o. male Here for follow up of diabetes. He is fasting today.   Recent death in family His older brother, by 2 years, passed away a week (7 days) ago due to liver and small intestine cancer. It's been 2.5 years since his diagnosis.   Diabetes His last visit was on July 24th.   Lab Results  Component Value Date   HGBA1C 7.2 (H) 05/05/2017   Decided against start of Januvia with plan of diet changes, he was continued on glipizide 10mg  BID with food and metformin 1000mg  BID; hypoglycemia precautions discussed.   Patient states he's been checking his blood sugar, but not running as well as before. He hasn't been watching his diet as his sister stayed with him for a few months taking care of their brother. He states his sister makes really good desserts. He's still taking glipizide without any missed doses.   Wt Readings from Last 3 Encounters:  09/06/17 205 lb (93 kg)  04/27/17 201 lb (91.2 kg)  02/02/17 202 lb (91.6 kg)   He had micro albumin of 35 in May.  His last optho visit was in Jan 2018.  He had foot exam done in July 2018.  He had pneumovax in May 2018.  He was walking about 2 miles daily.   Hyperlipidemia Lab Results  Component Value Date   CHOL 154 02/02/2017   HDL 38 (L) 02/02/2017   LDLCALC 98 02/02/2017   TRIG 89 02/02/2017   CHOLHDL 4.1 02/02/2017   Lab Results  Component Value Date   ALT 23 02/02/2017   AST 23 02/02/2017   ALKPHOS 63 02/02/2017   BILITOT 0.4 02/02/2017   He was taking Caduet  10-80mg  but due to insurance, his medication was changed to Lipitor 80mg  QD. He denies any new muscle cramps.   HTN BP Readings from Last 3 Encounters:  09/06/17 138/68  04/27/17 (!) 155/75  02/02/17 128/62   He had some elevated readings last visit. He was continued on ARB, HCTZ and amlodipine at same doses. He notes his BP is usually lower at home; this morning was 122/61 prior to coming into office today.   Health maintenance Hep C Screening: agrees with blood work today.  Tdap: He believes last received 9-10 years ago.  Flu vaccine: received today.   Patient Active Problem List   Diagnosis Date Noted  . Hyperlipidemia 10/12/2013  . Diabetes ([place]) 08/23/2012  . HTN (hypertension) 08/23/2012   Past Medical History:  Diagnosis Date  . Diabetes mellitus without complication ([place])   . Hyperlipemia   . Hypertension    Past Surgical History:  Procedure Laterality Date  . FRACTURE SURGERY     R forearm   No Known Allergies Prior to Admission medications   Medication Sig Start Date End Date Taking? Authorizing Provider  amLODipine (NORVASC) 10 MG tablet Take 1 tablet (10 mg total) by mouth daily. 05/06/17  Yes [name], MD  atorvastatin (LIPITOR) 80 MG  tablet Take 1 tablet (80 mg total) by mouth daily. 05/06/17  Yes [name], MD  colchicine 0.6 MG tablet Take 2 pills initially with some food, then 1 twice daily. When doing better can taper to once daily for a few days and then discontinue. 04/13/16  Yes Posey Boyer, MD  glipiZIDE (GLUCOTROL) 10 MG tablet TAKE ONE TWICE DAILY FOR DIABETES 04/27/17  Yes [name], MD  losartan-hydrochlorothiazide [facility]) 100-25 MG tablet TAKE ONE EACH MORNING FOR BLOOD PRESSURE 04/27/17  Yes [name], MD  metFORMIN (GLUCOPHAGE) 1000 MG tablet TAKE ONE TABLET BY MOUTH TWICE DAILY FOR DIABETES. 04/27/17  Yes [name], MD  naproxen (NAPROSYN) 500 MG tablet Take 1 tablet (500 mg total) by mouth 2 (two) times  daily with a meal. 02/01/17  Yes Tereasa Coop, PA-C  sitaGLIPtin (JANUVIA) 100 MG tablet Take 1 tablet (100 mg total) by mouth daily. 04/27/17  Yes [name], MD  traMADol (ULTRAM) 50 MG tablet Take 0.5-1 tablets (25-50 mg total) by mouth every 8 (eight) hours as needed for severe pain. 02/01/17  Yes Tereasa Coop, PA-C   Social History   Socioeconomic History  . Marital status: Divorced    Spouse name: Not on file  . Number of children: 2  . Years of education: College  . Highest education level: Not on file  Social Needs  . Financial resource strain: Not on file  . Food insecurity - worry: Not on file  . Food insecurity - inability: Not on file  . Transportation needs - medical: Not on file  . Transportation needs - non-medical: Not on file  Occupational History  . Not on file  Tobacco Use  . Smoking status: Never Smoker  . Smokeless tobacco: Never Used  Substance and Sexual Activity  . Alcohol use: Yes    Alcohol/week: 0.0 oz  . Drug use: No  . Sexual activity: No    Birth control/protection: Abstinence  Other Topics Concern  . Not on file  Social History Narrative  . Not on file   Review of Systems  Constitutional: Negative for fatigue and unexpected weight change.  Eyes: Negative for visual disturbance.  Respiratory: Negative for cough, chest tightness and shortness of breath.   Cardiovascular: Negative for chest pain, palpitations and leg swelling.  Gastrointestinal: Negative for abdominal pain and blood in stool.  Neurological: Negative for dizziness, light-headedness and headaches.       Objective:   Physical Exam  Constitutional: He is oriented to person, place, and time. He appears well-developed and well-nourished.  HENT:  Head: Normocephalic and atraumatic.  Eyes: EOM are normal. Pupils are equal, round, and reactive to light.  Neck: No JVD present. Carotid bruit is not present.  Cardiovascular: Normal rate, regular rhythm and normal heart  sounds.  No murmur heard. Pulmonary/Chest: Effort normal and breath sounds normal. He has no rales.  Musculoskeletal: He exhibits no edema.  Neurological: He is alert and oriented to person, place, and time.  Skin: Skin is warm and dry.  Psychiatric: He has a normal mood and affect.  Vitals reviewed.   Vitals:   09/06/17 0922  BP: 138/68  Pulse: 84  Resp: 18  Temp: 97.8 F (36.6 C)  TempSrc: Oral  SpO2: 95%  Weight: 205 lb (93 kg)  Height: 5\' 6"  (1.676 m)      Assessment & Plan:   Derrick Becker is a 71 y.o. male Type 2 diabetes mellitus without  complication, without long-term current use of insulin ([place]) - Plan: Hemoglobin A1c, glipiZIDE (GLUCOTROL) 10 MG tablet, metFORMIN (GLUCOPHAGE) 1000 MG tablet  - Borderline control last visit. Some increased weight with change in diet recently, may space and some higher readings. He does plan on improving diet, returning to walking for exercise, so that can be considered with A1c result. Januvia was cost prohibitive.  - Continue glipizide, metformin for same dose for now, stressed importance of eating with glipizide to minimize risk for hypoglycemia.  -Recheck 3 months   Need for influenza vaccination - Plan: Flu Vaccine QUAD 36+ mos IM  Essential hypertension - Plan: losartan-hydrochlorothiazide (HYZAAR) 100-25 MG tablet, amLODipine (NORVASC) 10 MG tablet  - Borderline control here, but below goal level of 130/80 at home. No change in meds. Labs pending  Hyperlipidemia, unspecified hyperlipidemia type - Plan: Comprehensive metabolic panel, Lipid panel, atorvastatin (LIPITOR) 80 MG tablet  -Tolerating Lipitor current dose, ideally would like to see LDL less than 70 with diabetes, but maximized on Lipitor dosing at present. Could try diet approach along with current Lipitor dose to see if we can get closer to goal  Encounter for hepatitis C screening test for low risk patient - Plan: Hepatitis C antibody   Offered counseling resources  if needed with recent death of his brother. Declined any resource need at this time.  Meds ordered this encounter  Medications  . glipiZIDE (GLUCOTROL) 10 MG tablet    Sig: TAKE ONE TWICE DAILY FOR DIABETES    Dispense:  180 tablet    Refill:  1  . metFORMIN (GLUCOPHAGE) 1000 MG tablet    Sig: TAKE ONE TABLET BY MOUTH TWICE DAILY FOR DIABETES.    Dispense:  180 tablet    Refill:  1  . losartan-hydrochlorothiazide (HYZAAR) 100-25 MG tablet    Sig: TAKE ONE EACH MORNING FOR BLOOD PRESSURE    Dispense:  90 tablet    Refill:  1  . amLODipine (NORVASC) 10 MG tablet    Sig: Take 1 tablet (10 mg total) by mouth daily.    Dispense:  90 tablet    Refill:  1  . atorvastatin (LIPITOR) 80 MG tablet    Sig: Take 1 tablet (80 mg total) by mouth daily.    Dispense:  90 tablet    Refill:  1   Patient Instructions   Flu vaccine given today. Return for tetanus as discussed.  Restart exercise most days per week, try to avoid sugar containing beverages such as Sweet tea's and sodas. Watch portion control to help with diet and weight loss. Depending on A1c, can decide on other medication if needed. For now continue glipizide and metformin same doses.  No change in other medications for now, blood pressure goal less than 130/80. If your readings run continuously higher, let me know so we can change medication.  Im sorry to hear about your loss.Please let me know if I can provide any resources for you in the time of grieving.  Recheck in 3 months for diabetes.    IF you received an x-ray today, you will receive an invoice from [facility]. Please contact [facility] at [phone] with questions or concerns regarding your invoice.   IF you received labwork today, you will receive an invoice from [place]. Please contact [facility] at [phone] with questions or concerns regarding your invoice.   Our billing staff will not be able to assist you with questions regarding bills  from these companies.  You will  be contacted with the lab results as soon as they are available. The fastest way to get your results is to activate your My Chart account. Instructions are located on the last page of this paperwork. If you have not heard from [place] regarding the results in 2 weeks, please contact this office.      I personally performed the services described in this documentation, which was scribed in my presence. The recorded information has been reviewed and considered for accuracy and completeness, addended by me as needed, and agree with information above.  Signed,   Merri Ray, MD [facility] [place].  09/06/17 10:25 AM

## 2017-09-07 LAB — LIPID PANEL
Chol/HDL Ratio: 3.5 ratio (ref 0.0–5.0)
Cholesterol, Total: 159 mg/dL (ref 100–199)
HDL: 45 mg/dL
LDL Calculated: 88 mg/dL (ref 0–99)
Triglycerides: 128 mg/dL (ref 0–149)
VLDL Cholesterol Cal: 26 mg/dL (ref 5–40)

## 2017-09-07 LAB — COMPREHENSIVE METABOLIC PANEL WITH GFR
ALT: 21 IU/L (ref 0–44)
AST: 26 IU/L (ref 0–40)
Albumin/Globulin Ratio: 2 (ref 1.2–2.2)
Albumin: 4.8 g/dL (ref 3.5–4.8)
Alkaline Phosphatase: 62 IU/L (ref 39–117)
BUN/Creatinine Ratio: 15 (ref 10–24)
BUN: 15 mg/dL (ref 8–27)
Bilirubin Total: 0.4 mg/dL (ref 0.0–1.2)
CO2: 21 mmol/L (ref 20–29)
Calcium: 9.7 mg/dL (ref 8.6–10.2)
Chloride: 99 mmol/L (ref 96–106)
Creatinine, Ser: 1 mg/dL (ref 0.76–1.27)
GFR calc Af Amer: 87 mL/min/1.73
GFR calc non Af Amer: 75 mL/min/1.73
Globulin, Total: 2.4 g/dL (ref 1.5–4.5)
Glucose: 134 mg/dL — ABNORMAL HIGH (ref 65–99)
Potassium: 4.5 mmol/L (ref 3.5–5.2)
Sodium: 140 mmol/L (ref 134–144)
Total Protein: 7.2 g/dL (ref 6.0–8.5)

## 2017-09-07 LAB — HEMOGLOBIN A1C
Est. average glucose Bld gHb Est-mCnc: 160 mg/dL
Hgb A1c MFr Bld: 7.2 % — ABNORMAL HIGH (ref 4.8–5.6)

## 2017-09-07 LAB — HEPATITIS C ANTIBODY: Hep C Virus Ab: <0.1 s/co ratio (ref 0.0–0.9)

## 2017-11-02 DIAGNOSIS — H35373 Puckering of macula, bilateral: Secondary | ICD-10-CM | POA: Diagnosis not present

## 2017-11-02 DIAGNOSIS — H25813 Combined forms of age-related cataract, bilateral: Secondary | ICD-10-CM | POA: Diagnosis not present

## 2017-11-02 DIAGNOSIS — E119 Type 2 diabetes mellitus without complications: Secondary | ICD-10-CM | POA: Diagnosis not present

## 2017-11-02 DIAGNOSIS — H40013 Open angle with borderline findings, low risk, bilateral: Secondary | ICD-10-CM | POA: Diagnosis not present

## 2017-12-07 ENCOUNTER — Ambulatory Visit (INDEPENDENT_AMBULATORY_CARE_PROVIDER_SITE_OTHER): Payer: Medicare HMO | Admitting: Family Medicine

## 2017-12-07 ENCOUNTER — Encounter: Payer: Self-pay | Admitting: Family Medicine

## 2017-12-07 DIAGNOSIS — M1A0790 Idiopathic chronic gout, unspecified ankle and foot, without tophus (tophi): Secondary | ICD-10-CM | POA: Diagnosis not present

## 2017-12-07 DIAGNOSIS — E119 Type 2 diabetes mellitus without complications: Secondary | ICD-10-CM | POA: Diagnosis not present

## 2017-12-07 MED ORDER — COLCHICINE 0.6 MG PO TABS: ORAL_TABLET | ORAL | 1 refills | Status: DC

## 2017-12-07 MED ORDER — METFORMIN HCL 1000 MG PO TABS: ORAL_TABLET | ORAL | 1 refills | Status: DC

## 2017-12-07 MED ORDER — GLIPIZIDE 10 MG PO TABS: ORAL_TABLET | ORAL | 1 refills | Status: DC

## 2017-12-07 MED ORDER — ALLOPURINOL 100 MG PO TABS: 100.0000 mg | ORAL_TABLET | Freq: Every day | ORAL | 1 refills | Status: DC

## 2017-12-07 NOTE — Patient Instructions (Addendum)
  Weight has come down a few pounds. Keep up the exercise as tolerated.   Can try allopurinol once per day. No other change in meds today.   Recheck in 3 months.   IF you received an x-ray today, you will receive an invoice from [facility]. Please contact [facility] at [phone] with questions or concerns regarding your invoice.   IF you received labwork today, you will receive an invoice from [place]. Please contact [facility] at [phone] with questions or concerns regarding your invoice.   Our billing staff will not be able to assist you with questions regarding bills from these companies.  You will be contacted with the lab results as soon as they are available. The fastest way to get your results is to activate your My Chart account. Instructions are located on the last page of this paperwork. If you have not heard from [place] regarding the results in 2 weeks, please contact this office.

## 2017-12-07 NOTE — Progress Notes (Signed)
Subjective:  By signing my name below, I, Derrick Becker, attest that this documentation has been prepared under the direction and in the presence of [name], MD Electronically Signed: Ladene Artist, ED Scribe 12/07/2017 at 9:15 AM.   Patient ID: Derrick Becker, male    DOB: [birth year], 72 y.o.   MRN: [id number]  Chief Complaint  Patient presents with  . Diabetes    3 month follow up   HPI Derrick Becker is a 72 y.o. male who presents to Primary Care at [facility] for DM f/u. Last seen 12/3.  Lab Results  Component Value Date   HGBA1C 7.2 (H) 09/06/2017   Lab Results  Component Value Date   MICROALBUR 18.2 (H) 12/22/2014  Last microalbumin 02/2017 was 35.5. Takes Metformin 1000 mg bid and glipizide 10 mg bid. Pt has been checking his blood glucose at home with readings ranging from 128-160s. He last saw optho 1/24; slight cataract in R eye. Denies cp, sob, lightheadedness, dizziness, blood in stools, melena.  Wt Readings from Last 3 Encounters:  12/07/17 202 lb (91.6 kg)  09/06/17 205 lb (93 kg)  04/27/17 201 lb (91.2 kg)   Gout Pt recently started exercising; states he hadn't prior due to gout flare-ups in feet, knees and worse in ankles. Flare-ups include swelling and soreness. Pt was only take colchicine for flare-ups, ~3/yr with the last one being 1 month ago. He has been taking his brother's allopurinol, 1/2 of a 300 mg tab qd x 3 wks without any pain. Uric acid 6 in July 2017.  Patient Active Problem List   Diagnosis Date Noted  . Hyperlipidemia 10/12/2013  . Diabetes ([place]) 08/23/2012  . HTN (hypertension) 08/23/2012   Past Medical History:  Diagnosis Date  . Diabetes mellitus without complication ([place])   . Hyperlipemia   . Hypertension    Past Surgical History:  Procedure Laterality Date  . FRACTURE SURGERY     R forearm   No Known Allergies Prior to Admission medications   Medication Sig Start Date End Date Taking? Authorizing Provider  amLODipine  (NORVASC) 10 MG tablet Take 1 tablet (10 mg total) by mouth daily. 09/06/17  Yes [name], MD  atorvastatin (LIPITOR) 80 MG tablet Take 1 tablet (80 mg total) by mouth daily. 09/06/17  Yes [name], MD  colchicine 0.6 MG tablet Take 2 pills initially with some food, then 1 twice daily. When doing better can taper to once daily for a few days and then discontinue. 04/13/16  Yes Posey Boyer, MD  glipiZIDE (GLUCOTROL) 10 MG tablet TAKE ONE TWICE DAILY FOR DIABETES 09/06/17  Yes [name], MD  losartan-hydrochlorothiazide [facility]) 100-25 MG tablet TAKE ONE EACH MORNING FOR BLOOD PRESSURE 09/06/17  Yes [name], MD  metFORMIN (GLUCOPHAGE) 1000 MG tablet TAKE ONE TABLET BY MOUTH TWICE DAILY FOR DIABETES. 09/06/17  Yes [name], MD  naproxen (NAPROSYN) 500 MG tablet Take 1 tablet (500 mg total) by mouth 2 (two) times daily with a meal. 02/01/17  Yes Tereasa Coop, PA-C  traMADol (ULTRAM) 50 MG tablet Take 0.5-1 tablets (25-50 mg total) by mouth every 8 (eight) hours as needed for severe pain. 02/01/17  Yes Tereasa Coop, PA-C   Social History   Socioeconomic History  . Marital status: Divorced    Spouse name: Not on file  . Number of children: 2  . Years of education: College  . Highest education level: Not on file  Social Needs  . Financial resource strain: Not on file  . Food insecurity - worry: Not on file  . Food insecurity - inability: Not on file  . Transportation needs - medical: Not on file  . Transportation needs - non-medical: Not on file  Occupational History  . Not on file  Tobacco Use  . Smoking status: Never Smoker  . Smokeless tobacco: Never Used  Substance and Sexual Activity  . Alcohol use: Yes    Alcohol/week: 0.0 oz  . Drug use: No  . Sexual activity: No    Birth control/protection: Abstinence  Other Topics Concern  . Not on file  Social History Narrative  . Not on file   Review of Systems  Constitutional: Negative for  fatigue and unexpected weight change.  Eyes: Negative for visual disturbance.  Respiratory: Negative for cough, chest tightness and shortness of breath.   Cardiovascular: Negative for chest pain, palpitations and leg swelling.  Gastrointestinal: Negative for abdominal pain and blood in stool.  Musculoskeletal: Arthralgias: resolved. Joint swelling: resolved.  Neurological: Negative for dizziness, light-headedness and headaches.      Objective:   Physical Exam  Constitutional: He is oriented to person, place, and time. He appears well-developed and well-nourished.  HENT:  Head: Normocephalic and atraumatic.  Eyes: EOM are normal. Pupils are equal, round, and reactive to light.  Neck: No JVD present. Carotid bruit is not present.  Cardiovascular: Normal rate, regular rhythm and normal heart sounds.  No murmur heard. Pulmonary/Chest: Effort normal and breath sounds normal. He has no rales.  Musculoskeletal: He exhibits no edema.  Neurological: He is alert and oriented to person, place, and time.  Skin: Skin is warm and dry.  Psychiatric: He has a normal mood and affect.  Vitals reviewed.  Vitals:   12/07/17 0856 12/07/17 0857  BP: (!) 144/73 130/60  Pulse: 81   Resp: 18   Temp: 97.8 F (36.6 C)   TempSrc: Oral   SpO2: 96%   Weight: 202 lb (91.6 kg)   Height: 5\' 6"  (1.676 m)       Assessment & Plan:   Derrick Becker is a 72 y.o. male Type 2 diabetes mellitus without complication, without long-term current use of insulin ([facility]) - Plan: Hemoglobin A1c, metFORMIN (GLUCOPHAGE) 1000 MG tablet, glipiZIDE (GLUCOTROL) 10 MG tablet  - check A1c, continue same regimen for now. ocntinue exercise, diet for weight loss.   Chronic gout of ankle, unspecified cause, unspecified laterality - Plan: Uric Acid, allopurinol (ZYLOPRIM) 100 MG tablet, colchicine 0.6 MG tablet  - has been tolerating allopurinol at 150mg . Rx will be given for 100mg , check uric acid, colchicine if needed for flair.     recheck 3 months. Meds ordered this encounter  Medications  . allopurinol (ZYLOPRIM) 100 MG tablet    Sig: Take 1 tablet (100 mg total) by mouth daily.    Dispense:  90 tablet    Refill:  1  . metFORMIN (GLUCOPHAGE) 1000 MG tablet    Sig: TAKE ONE TABLET BY MOUTH TWICE DAILY FOR DIABETES.    Dispense:  180 tablet    Refill:  1  . glipiZIDE (GLUCOTROL) 10 MG tablet    Sig: TAKE ONE TWICE DAILY FOR DIABETES    Dispense:  180 tablet    Refill:  1  . colchicine 0.6 MG tablet    Sig: Take 2 pills initially with some food, then 1 twice daily. When doing better can taper to once daily for a few  days and then discontinue.    Dispense:  30 tablet    Refill:  1   Patient Instructions    Weight has come down a few pounds. Keep up the exercise as tolerated.   Can try allopurinol once per day. No other change in meds today.   Recheck in 3 months.   IF you received an x-ray today, you will receive an invoice from [facility]. Please contact [facility] at [phone] with questions or concerns regarding your invoice.   IF you received labwork today, you will receive an invoice from [place]. Please contact [facility] at [phone] with questions or concerns regarding your invoice.   Our billing staff will not be able to assist you with questions regarding bills from these companies.  You will be contacted with the lab results as soon as they are available. The fastest way to get your results is to activate your My Chart account. Instructions are located on the last page of this paperwork. If you have not heard from [place] regarding the results in 2 weeks, please contact this office.      I personally performed the services described in this documentation, which was scribed in my presence. The recorded information has been reviewed and considered for accuracy and completeness, addended by me as needed, and agree with information above.  Signed,   Merri Ray,  MD [facility] [place].  12/07/17 9:30 AM

## 2017-12-08 LAB — URIC ACID: URIC ACID: 5.2 mg/dL (ref 3.7–8.6)

## 2017-12-08 LAB — HEMOGLOBIN A1C
Est. average glucose Bld gHb Est-mCnc: 169 mg/dL
Hgb A1c MFr Bld: 7.5 % — ABNORMAL HIGH (ref 4.8–5.6)

## 2018-01-15 IMAGING — DX DG KNEE COMPLETE 4+V*R*
4 series · 4 of 4 positions shown · non-contrast
Comparison: None.

CLINICAL DATA: 71-year-old male with knee pain and swelling for 1
week. Knee joint effusion suspected.

EXAM:
RIGHT KNEE - COMPLETE 4+ VIEW

[knee ap]
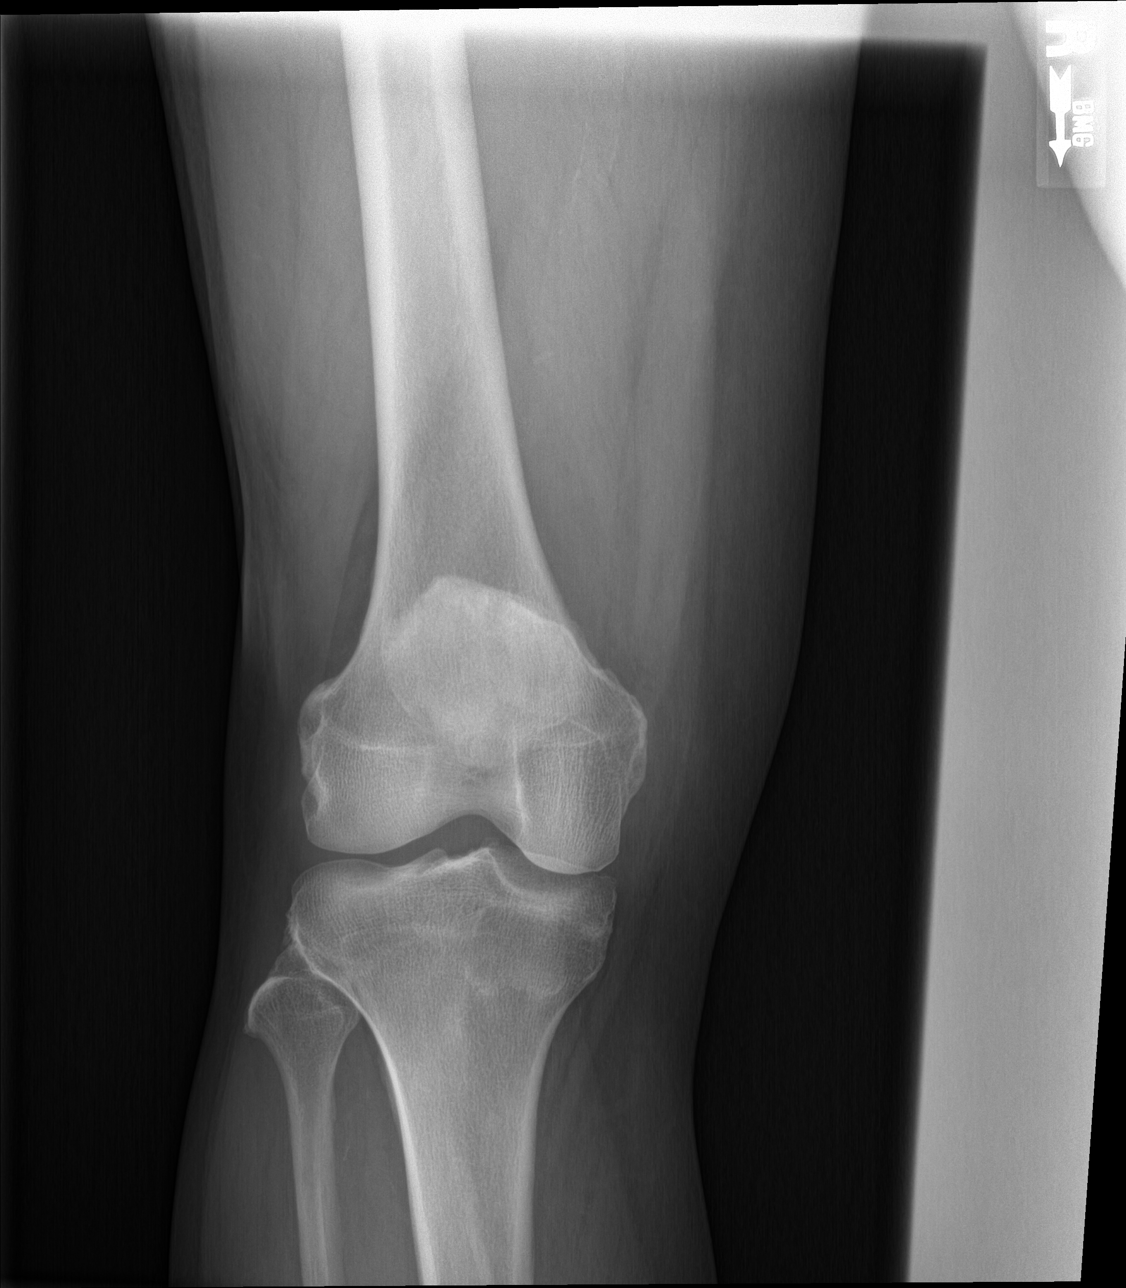

[knee lat]
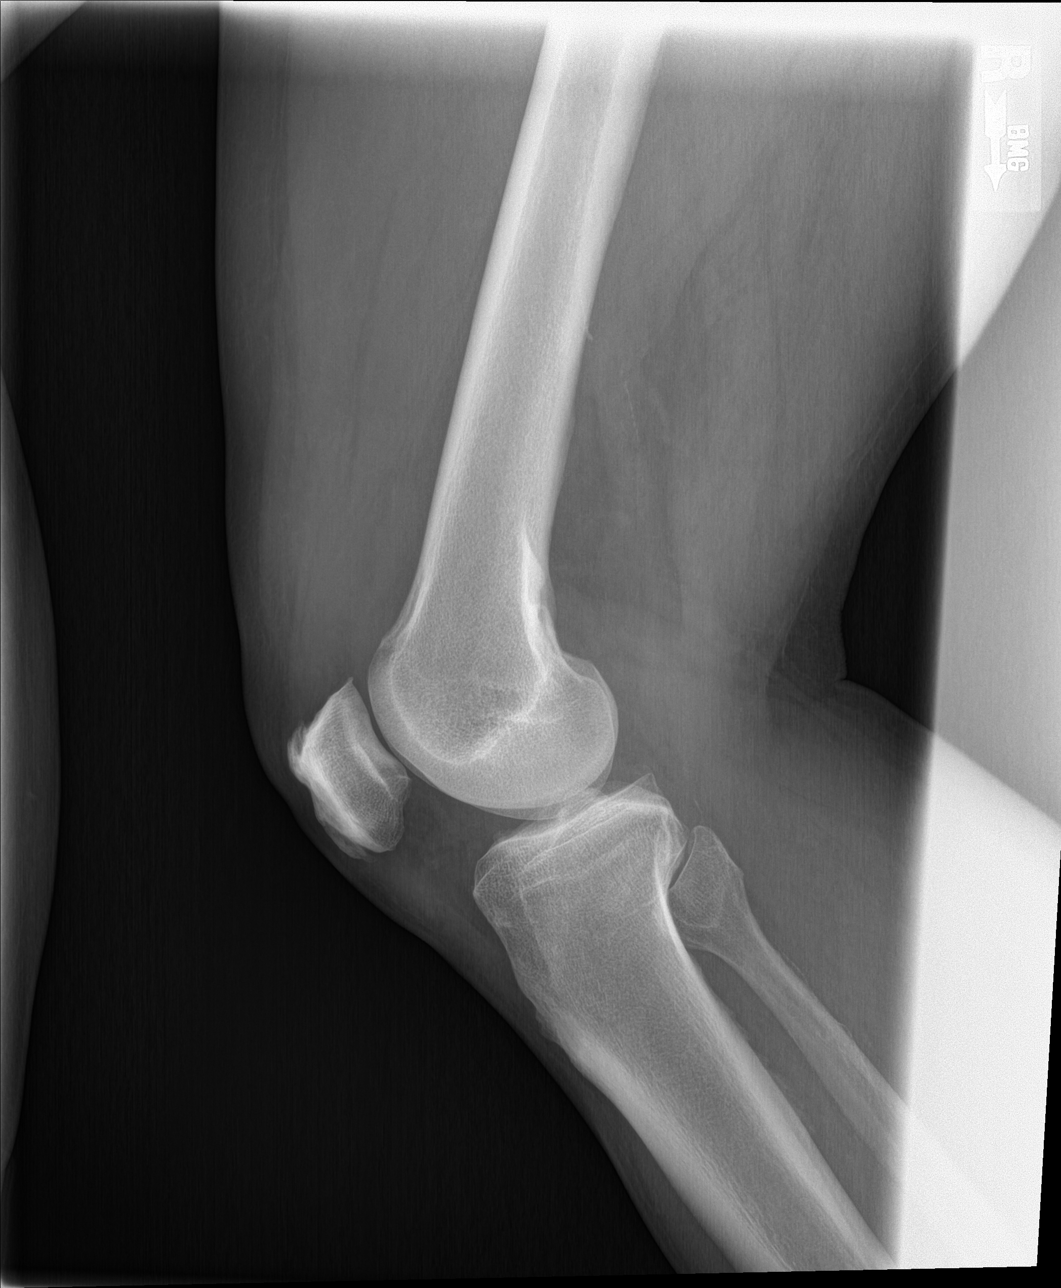

[sunrise]
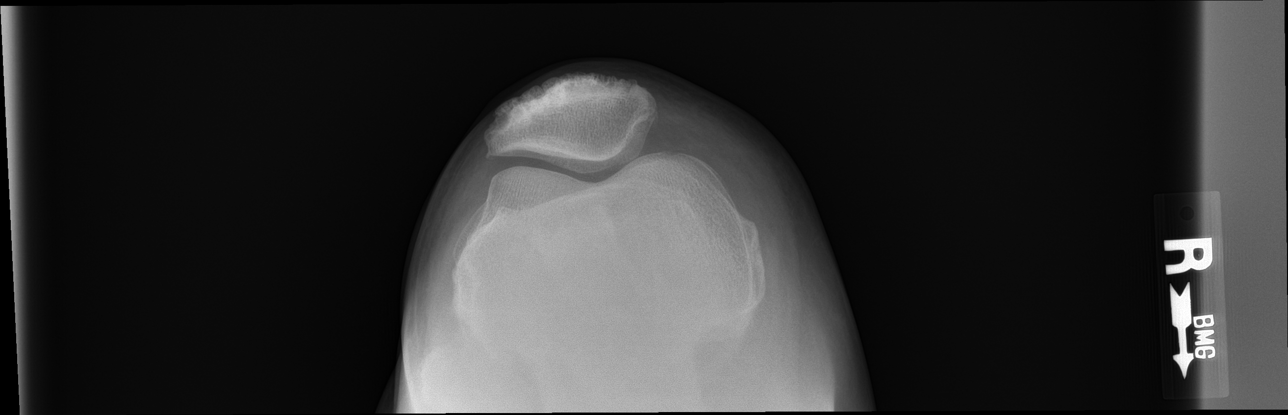

[knee [person_name]]
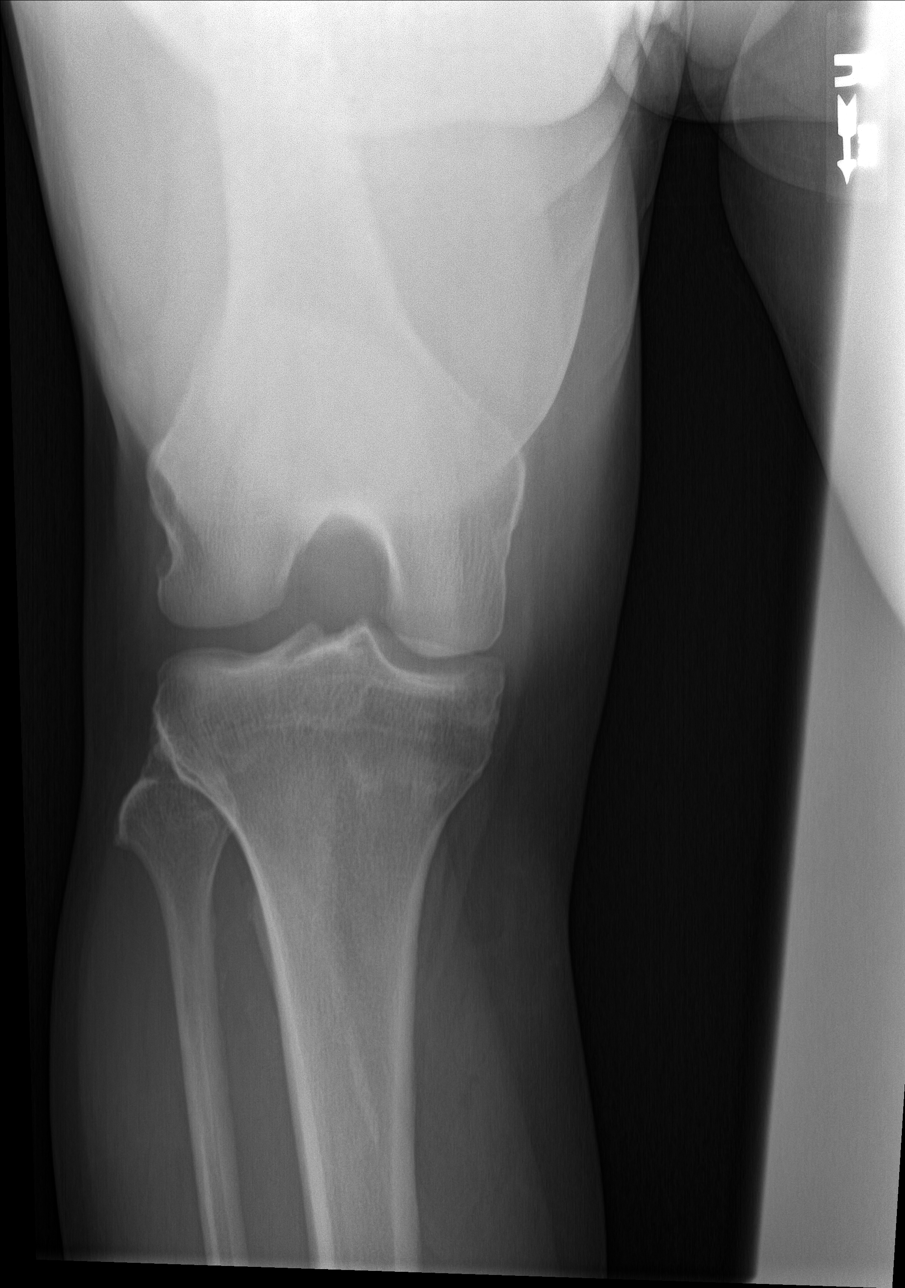

[4 of 4 positions shown; findings below may reference images not displayed]

FINDINGS: Bone mineralization is within normal limits for age. The lateral and
patellofemoral compartment joint spaces are preserved. There is mild
medial compartment joint space loss. There is mild patellofemoral
compartment degenerative spurring. Indistinct appearance of the
suprapatellar soft tissues compatible with small joint effusion. No
acute osseous abnormality identified. Mild calcified femoral artery
atherosclerosis.
IMPRESSION: 1. Small right knee joint effusion suspected. Mild for age medial
and patellofemoral compartment degeneration with no acute osseous
abnormality identified.
2. Mild calcified femoral artery atherosclerosis.

## 2018-01-25 ENCOUNTER — Telehealth: Payer: Self-pay | Admitting: Neurology

## 2018-01-25 NOTE — Telephone Encounter (Addendum)
Pt called to advise he has not been using the CPAP for the past 9 mths and c/a the appt on 4/25 (Thursday). Pt said he is wanting to restart it. Please call to advise

## 2018-01-25 NOTE — Telephone Encounter (Signed)
I called pt. He reports that he stopped using his cpap last summer because he was struggling with the pressure change and just got out of the habit. He is motivated to start using his machine and has enough supplies from [place]. We rescheduled his appt until July 9th, 2019 with Dr. Rexene Alberts to recheck compliance and order new supplies. Pt verbalized understanding of cpap compliance expectations and of new appt date and time, and of the cancelled appt this week.

## 2018-01-27 ENCOUNTER — Ambulatory Visit: Payer: Managed Care, Other (non HMO) | Admitting: Adult Health

## 2018-03-09 ENCOUNTER — Other Ambulatory Visit: Payer: Self-pay | Admitting: Family Medicine

## 2018-03-09 DIAGNOSIS — I10 Essential (primary) hypertension: Secondary | ICD-10-CM

## 2018-03-14 ENCOUNTER — Ambulatory Visit (INDEPENDENT_AMBULATORY_CARE_PROVIDER_SITE_OTHER): Payer: Medicare HMO | Admitting: Family Medicine

## 2018-03-14 ENCOUNTER — Encounter: Payer: Self-pay | Admitting: Family Medicine

## 2018-03-14 ENCOUNTER — Other Ambulatory Visit: Payer: Self-pay

## 2018-03-14 VITALS — BP 122/64 | HR 77 | Temp 98.0°F | Resp 18 | Ht 66.0 in | Wt 197.6 lb

## 2018-03-14 DIAGNOSIS — E119 Type 2 diabetes mellitus without complications: Secondary | ICD-10-CM | POA: Diagnosis not present

## 2018-03-14 DIAGNOSIS — I10 Essential (primary) hypertension: Secondary | ICD-10-CM | POA: Diagnosis not present

## 2018-03-14 DIAGNOSIS — Z23 Encounter for immunization: Secondary | ICD-10-CM | POA: Diagnosis not present

## 2018-03-14 DIAGNOSIS — M109 Gout, unspecified: Secondary | ICD-10-CM

## 2018-03-14 DIAGNOSIS — E785 Hyperlipidemia, unspecified: Secondary | ICD-10-CM | POA: Diagnosis not present

## 2018-03-14 LAB — LIPID PANEL
CHOLESTEROL TOTAL: 130 mg/dL (ref 100–199)
Chol/HDL Ratio: 3.5 ratio (ref 0.0–5.0)
HDL: 37 mg/dL — ABNORMAL LOW (ref >39)
LDL Calculated: 79 mg/dL (ref 0–99)
Triglycerides: 72 mg/dL (ref 0–149)
VLDL CHOLESTEROL CAL: 14 mg/dL (ref 5–40)

## 2018-03-14 LAB — COMPREHENSIVE METABOLIC PANEL
ALBUMIN: 4.8 g/dL (ref 3.5–4.8)
ALK PHOS: 64 IU/L (ref 39–117)
ALT: 14 IU/L (ref 0–44)
AST: 18 IU/L (ref 0–40)
Albumin/Globulin Ratio: 2.2 (ref 1.2–2.2)
BILIRUBIN TOTAL: 0.2 mg/dL (ref 0.0–1.2)
BUN / CREAT RATIO: 19 (ref 10–24)
BUN: 22 mg/dL (ref 8–27)
CHLORIDE: 104 mmol/L (ref 96–106)
CO2: 20 mmol/L (ref 20–29)
Calcium: 9.6 mg/dL (ref 8.6–10.2)
Creatinine, Ser: 1.18 mg/dL (ref 0.76–1.27)
GFR calc Af Amer: 71 mL/min/{1.73_m2} (ref >59)
GFR calc non Af Amer: 61 mL/min/{1.73_m2} (ref >59)
GLUCOSE: 137 mg/dL — AB (ref 65–99)
Globulin, Total: 2.2 g/dL (ref 1.5–4.5)
POTASSIUM: 4.8 mmol/L (ref 3.5–5.2)
Sodium: 141 mmol/L (ref 134–144)
Total Protein: 7 g/dL (ref 6.0–8.5)

## 2018-03-14 LAB — HEMOGLOBIN A1C
ESTIMATED AVERAGE GLUCOSE: 140 mg/dL
Hgb A1c MFr Bld: 6.5 % — ABNORMAL HIGH (ref 4.8–5.6)

## 2018-03-14 MED ORDER — AMLODIPINE BESYLATE 10 MG PO TABS: 10.0000 mg | ORAL_TABLET | Freq: Every day | ORAL | 1 refills | Status: DC

## 2018-03-14 MED ORDER — GLIPIZIDE 10 MG PO TABS: ORAL_TABLET | ORAL | 1 refills | Status: DC

## 2018-03-14 MED ORDER — METFORMIN HCL 1000 MG PO TABS: ORAL_TABLET | ORAL | 1 refills | Status: DC

## 2018-03-14 MED ORDER — LOSARTAN POTASSIUM-HCTZ 100-25 MG PO TABS
1.0000 | ORAL_TABLET | Freq: Every day | ORAL | 1 refills | Status: DC
Start: 2018-03-14 — End: 2018-08-01

## 2018-03-14 MED ORDER — ATORVASTATIN CALCIUM 80 MG PO TABS: 80.0000 mg | ORAL_TABLET | Freq: Every day | ORAL | 1 refills | Status: DC

## 2018-03-14 NOTE — Progress Notes (Signed)
Subjective:  By signing my name below, I, Moises Blood, attest that this documentation has been prepared under the direction and in the presence of Merri Ray, MD. Electronically Signed: Moises Blood, [place]. 03/14/2018 , 9:18 AM .  Patient was seen in Room 12 .   Patient ID: Derrick Becker, male    DOB: Sep 07, 1946, 72 y.o.   MRN: [id number] Chief Complaint  Patient presents with  . Follow-up    Pt checked his glucose level this morning was 101  . Diabetes   HPI PLES TRUDEL is a 72 y.o. male Here for follow up on diabetes.   Diabetes He takes Metformin 1000 mg bid and Glipizide 10 mg bid.   Lab Results  Component Value Date   HGBA1C 7.5 (H) 12/07/2017   Decided to stay on same dose medications with increased exercise throughout the week.   Patient informs his blood sugar was 101 this morning. He's been doing more exercises and also working in his yard recently. He's had low sugars about once a month, lowest at 58. He had a light breakfast that day, and didn't eat lunch.   Urine micro albumin: due for it.  Optho: January 2019.  Foot: 04/30/17 Pneumovax: due for it, updated today.   Wt Readings from Last 3 Encounters:  03/14/18 197 lb 9.6 oz (89.6 kg)  12/07/17 202 lb (91.6 kg)  09/06/17 205 lb (93 kg)     Gout Lab Results  Component Value Date   LABURIC 5.2 12/07/2017   His allopurinol was changed to 100 mg in place of 150 mg at last visit. He denies any flares in the past year.   HTN  He takes Hyzaar 100-25 mg qd and amlodipine 10 mg qd. He denies chest pain, shortness of breath, lightheadedness, or dizziness with his medications.   Hyperlipidemia He takes Lipitor 80 mg qd. He denies any muscle aches or side effects with it.   Lab Results  Component Value Date   CHOL 159 09/06/2017   HDL 45 09/06/2017   LDLCALC 88 09/06/2017   TRIG 128 09/06/2017   CHOLHDL 3.5 09/06/2017   Lab Results  Component Value Date   ALT 21 09/06/2017   AST 26  09/06/2017   ALKPHOS 62 09/06/2017   BILITOT 0.4 09/06/2017    Patient Active Problem List   Diagnosis Date Noted  . Hyperlipidemia 10/12/2013  . Diabetes ([place]) 08/23/2012  . HTN (hypertension) 08/23/2012   Past Medical History:  Diagnosis Date  . Diabetes mellitus without complication ([place])   . Hyperlipemia   . Hypertension    Past Surgical History:  Procedure Laterality Date  . FRACTURE SURGERY     R forearm   No Known Allergies Prior to Admission medications   Medication Sig Start Date End Date Taking? Authorizing Provider  allopurinol (ZYLOPRIM) 100 MG tablet Take 1 tablet (100 mg total) by mouth daily. 12/07/17   [name], MD  amLODipine (NORVASC) 10 MG tablet Take 1 tablet (10 mg total) by mouth daily. 09/06/17   [name], MD  atorvastatin (LIPITOR) 80 MG tablet Take 1 tablet (80 mg total) by mouth daily. 09/06/17   [name], MD  colchicine 0.6 MG tablet Take 2 pills initially with some food, then 1 twice daily. When doing better can taper to once daily for a few days and then discontinue. 12/07/17   [name], MD  glipiZIDE (GLUCOTROL) 10 MG tablet TAKE ONE TWICE DAILY FOR DIABETES 12/07/17  [name], MD  losartan-hydrochlorothiazide [facility]) 100-25 MG tablet TAKE ONE EACH MORNING FOR BLOOD PRESSURE 03/09/18   [name], MD  metFORMIN (GLUCOPHAGE) 1000 MG tablet TAKE ONE TABLET BY MOUTH TWICE DAILY FOR DIABETES. 12/07/17   [name], MD  naproxen (NAPROSYN) 500 MG tablet Take 1 tablet (500 mg total) by mouth 2 (two) times daily with a meal. 02/01/17   Tereasa Coop, PA-C  traMADol (ULTRAM) 50 MG tablet Take 0.5-1 tablets (25-50 mg total) by mouth every 8 (eight) hours as needed for severe pain. 02/01/17   Tereasa Coop, PA-C   Social History   Socioeconomic History  . Marital status: Divorced    Spouse name: Not on file  . Number of children: 2  . Years of education: College  . Highest education level: Not on file    Occupational History  . Not on file  Social Needs  . Financial resource strain: Not on file  . Food insecurity:    Worry: Not on file    Inability: Not on file  . Transportation needs:    Medical: Not on file    Non-medical: Not on file  Tobacco Use  . Smoking status: Never Smoker  . Smokeless tobacco: Never Used  Substance and Sexual Activity  . Alcohol use: Yes    Alcohol/week: 0.0 oz  . Drug use: No  . Sexual activity: Never    Birth control/protection: Abstinence  Lifestyle  . Physical activity:    Days per week: Not on file    Minutes per session: Not on file  . Stress: Not on file  Relationships  . Social connections:    Talks on phone: Not on file    Gets together: Not on file    Attends religious service: Not on file    Active member of club or organization: Not on file    Attends meetings of clubs or organizations: Not on file    Relationship status: Not on file  . Intimate partner violence:    Fear of current or ex partner: Not on file    Emotionally abused: Not on file    Physically abused: Not on file    Forced sexual activity: Not on file  Other Topics Concern  . Not on file  Social History Narrative  . Not on file   Review of Systems  Constitutional: Negative for fatigue and unexpected weight change.  Eyes: Negative for visual disturbance.  Respiratory: Negative for cough, chest tightness and shortness of breath.   Cardiovascular: Negative for chest pain, palpitations and leg swelling.  Gastrointestinal: Negative for abdominal pain and blood in stool.  Neurological: Negative for dizziness, light-headedness and headaches.       Objective:   Physical Exam  Constitutional: He is oriented to person, place, and time. He appears well-developed and well-nourished.  HENT:  Head: Normocephalic and atraumatic.  Eyes: Pupils are equal, round, and reactive to light. EOM are normal.  Neck: No JVD present. Carotid bruit is not present.  Cardiovascular:  Normal rate, regular rhythm and normal heart sounds.  No murmur heard. Pulmonary/Chest: Effort normal and breath sounds normal. He has no rales.  Musculoskeletal: He exhibits no edema.  Neurological: He is alert and oriented to person, place, and time.  Skin: Skin is warm and dry.  Psychiatric: He has a normal mood and affect.  Vitals reviewed.   Vitals:   03/14/18 0844  BP: 122/64  Pulse: 77  Resp: 18  Temp:  98 F (36.7 C)  TempSrc: Oral  SpO2: 97%  Weight: 197 lb 9.6 oz (89.6 kg)  Height: 5\' 6"  (1.676 m)       Assessment & Plan:   YURIEL LOPEZMARTINEZ is a 72 y.o. male Gout, unspecified cause, unspecified chronicity, unspecified site  -Stable on 100 mg allopurinol daily.  Continue same dose, if he experiences flare should return for recheck testing.  Type 2 diabetes mellitus without complication, without long-term current use of insulin (HCC) - Plan: Hemoglobin A1c, metFORMIN (GLUCOPHAGE) 1000 MG tablet, glipiZIDE (GLUCOTROL) 10 MG tablet  -Commended on exercise and weight loss.  Check hemoglobin A1c.  No med changes for now..  Advised to take half glipizide or hold glipizide if he eats small meal or does skip meal.  Ideally should be eating regular meals throughout the day.  -Pneumonia vaccination updated with Pneumovax 23 Tolerating current dose of Lipitor.  Check labs, no changes. Essential hypertension - Plan: Comprehensive metabolic panel, losartan-hydrochlorothiazide (HYZAAR) 100-25 MG tablet, amLODipine (NORVASC) 10 MG tablet  -Stable, tolerating regimen, no changes.  Labs pending  Hyperlipidemia, unspecified hyperlipidemia type - Plan: Comprehensive metabolic panel, Lipid panel, atorvastatin (LIPITOR) 80 MG tablet  -Tolerating current dose of Lipitor.  Check labs, no changes  Meds ordered this encounter  Medications  . metFORMIN (GLUCOPHAGE) 1000 MG tablet    Sig: TAKE ONE TABLET BY MOUTH TWICE DAILY FOR DIABETES.    Dispense:  180 tablet    Refill:  1  .  losartan-hydrochlorothiazide (HYZAAR) 100-25 MG tablet    Sig: Take 1 tablet by mouth daily.    Dispense:  90 tablet    Refill:  1  . glipiZIDE (GLUCOTROL) 10 MG tablet    Sig: TAKE ONE TWICE DAILY FOR DIABETES    Dispense:  180 tablet    Refill:  1  . atorvastatin (LIPITOR) 80 MG tablet    Sig: Take 1 tablet (80 mg total) by mouth daily.    Dispense:  90 tablet    Refill:  1  . amLODipine (NORVASC) 10 MG tablet    Sig: Take 1 tablet (10 mg total) by mouth daily.    Dispense:  90 tablet    Refill:  1   Patient Instructions   No change in medications at this time.  Keep up the good work with activity/exercise most days per week.  Follow-up in 3 months.  Let me know if there are questions prior to that time.    IF you received an x-ray today, you will receive an invoice from [facility]. Please contact [facility] at [phone] with questions or concerns regarding your invoice.   IF you received labwork today, you will receive an invoice from [place]. Please contact [facility] at [phone] with questions or concerns regarding your invoice.   Our billing staff will not be able to assist you with questions regarding bills from these companies.  You will be contacted with the lab results as soon as they are available. The fastest way to get your results is to activate your My Chart account. Instructions are located on the last page of this paperwork. If you have not heard from [place] regarding the results in 2 weeks, please contact this office.       I personally performed the services described in this documentation, which was scribed in my presence. The recorded information has been reviewed and considered for accuracy and completeness, addended by me as needed, and agree with information above.  Signed,  Merri Ray, MD [facility].  03/14/18 9:26 AM

## 2018-03-14 NOTE — Patient Instructions (Addendum)
No change in medications at this time.  Keep up the good work with activity/exercise most days per week.  Follow-up in 3 months.  Let me know if there are questions prior to that time.    IF you received an x-ray today, you will receive an invoice from [facility]. Please contact [facility] at [phone] with questions or concerns regarding your invoice.   IF you received labwork today, you will receive an invoice from [place]. Please contact [facility] at [phone] with questions or concerns regarding your invoice.   Our billing staff will not be able to assist you with questions regarding bills from these companies.  You will be contacted with the lab results as soon as they are available. The fastest way to get your results is to activate your My Chart account. Instructions are located on the last page of this paperwork. If you have not heard from [place] regarding the results in 2 weeks, please contact this office.

## 2018-04-10 ENCOUNTER — Other Ambulatory Visit: Payer: Self-pay | Admitting: Family Medicine

## 2018-04-10 ENCOUNTER — Encounter: Payer: Self-pay | Admitting: Neurology

## 2018-04-10 DIAGNOSIS — I10 Essential (primary) hypertension: Secondary | ICD-10-CM

## 2018-04-12 ENCOUNTER — Ambulatory Visit: Payer: Medicare HMO | Admitting: Neurology

## 2018-04-12 ENCOUNTER — Encounter: Payer: Self-pay | Admitting: Neurology

## 2018-04-12 VITALS — BP 141/74 | HR 98 | Ht 66.0 in | Wt 198.0 lb

## 2018-04-12 DIAGNOSIS — Z9989 Dependence on other enabling machines and devices: Secondary | ICD-10-CM | POA: Diagnosis not present

## 2018-04-12 DIAGNOSIS — G4733 Obstructive sleep apnea (adult) (pediatric): Secondary | ICD-10-CM | POA: Diagnosis not present

## 2018-04-12 NOTE — Patient Instructions (Addendum)
Please continue using your autoPAP regularly. While your insurance requires that you use PAP at least 4 hours each night on 70% of the nights, I recommend, that you not skip any nights and use it throughout the night if you can. Getting used to PAP and staying with the treatment long term does take time and patience and discipline. Untreated obstructive sleep apnea when it is moderate to severe can have an adverse impact on cardiovascular health and raise her risk for heart disease, arrhythmias, hypertension, congestive heart failure, stroke and diabetes. Untreated obstructive sleep apnea causes sleep disruption, nonrestorative sleep, and sleep deprivation. This can have an impact on your day to day functioning and cause daytime sleepiness and impairment of cognitive function, memory loss, mood disturbance, and problems focussing. Using PAP regularly can improve these symptoms. Good job with your autoPAP! We can see you in 1 year, you can see one of our nurse practitioners as you are stable. I will see you after that.

## 2018-04-12 NOTE — Progress Notes (Signed)
Subjective:    Patient ID: Derrick Becker is a 72 y.o. male.  HPI     Interim history:   Derrick Becker is a 72 year old right-handed gentleman with an underlying medical history of type 2 diabetes, hypertension, hyperlipidemia and obesity, who presents for follow up consultation of his obstructive sleep apnea, on AutoPap therapy. The patient is unaccompanied today. I last saw him on 01/25/2017, at which time he was doing well. He had an overnight pulse oximetry test in November 2017 which showed benign results on AutoPap therapy.  Today, 04/12/2018: I reviewed his AutoPap compliance data from 03/12/2018 through 04/10/2018 which is a total of 30 days, during which time he used his AutoPap 22 days with percent used days greater than 4 hours at 67%, indicating slightly suboptimal compliance with an average usage of 4 hours and 52 minutes, residual AHI at goal at 0.7 per hour, 95th percentile of pressure at 8.1 cm, leak acceptable with the 95th percentile at 8.7 L/m on a pressure range of 5-15 cm with EPR. He reports doing well with AutoPap therapy. Did not do well with CPAP. Has updated supplies, using P10 nasal pillows. He has lost a little bit weight. Tries to walk daily. A1c is better too.    The patient's allergies, current medications, family history, past medical history, past social history, past surgical history and problem list were reviewed and updated as appropriate.   Previously (copied from previous notes for reference):   I saw him on 07/27/2016, at which time he reported doing well on AutoPap therapy. He felt better. He had very good compliance with AutoPap. He had not had his also oximetry test yet. I suggested he continue with AutoPap therapy and we order a pulse ox oximetry test overnight.   I reviewed his AutoPap compliance data from 12/21/2016 through 01/19/2017, which is a total of 30 days, during which time he used his CPAP only 19 days with percent used days greater than 4  hours at 57%, indicating suboptimal compliance with an average usage of 4 hours and 32 minutes, residual AHI 0.6 per hour, 95th percentile pressure of 9.4 cm, leak acceptable for the 95th percentile at 14.3 L/m, range of 5-15 cm with EPR. He had an overnight pulse oximetry test on 08/05/2016 which I reviewed: Average oxygen saturation was 95%, nadir was 88%. Time below 88% saturation was 0 minutes.    I saw him on 01/22/2016, at which time he was compliant with AutoPap. Unfortunately, his overnight pulse oximetry test had not been done. We re-requested that from his DME. He reported doing well, he was adjusting well to treatment, he felt better rested with AutoPap therapy and requested to try a chinstrap because of occasional mouth opening and mouth dryness. Otherwise he was doing rather well. I suggested we proceed with overnight pulse oximetry testing while he is on AutoPap therapy to ensure proper oxygen saturations while on treatment. Unfortunately, this has yet to be done. We called his DME company on 07/23/2016 and they had not done the ONO yet.    I reviewed his AutoPap compliance data from 06/23/2016 through 07/22/2016 which is a total of 30 days, during which time he used his machine 27 days with percent used days greater than 4 hours at 83%, indicating very good compliance with an average usage for all days of 4 hours and 28 minutes, residual AHI 1 per hour, 95th percentile of pressure at 9.3 cm, and leak acceptable for the 95th percentile at 14.8  L/m, range of 5 cm to 15 cm with EPR.   I first met him on 07/02/2015 at the request of his primary care physician, at which time the patient reported snoring, excessive daytime somnolence, sleep disruption, difficulty going to sleep and maintaining sleep. The patient was invited back for sleep study. His insurance denied an in-house sleep study. He had a home sleep test on 08/15/2015, which showed a total AHI of 42.9 per hour, oxycodone desaturation nadir  of 60%, time below 88% saturation was over 4 hours in keeping with significant nocturnal hypoxemia. Is insurance unfortunately also denied an in-house CPAP titration study which I requested. I placed him on AutoPap therapy. I also suggested we check an overnight pulse oximetry while he is on AutoPap therapy after he is established on treatment. This did not get done.   I reviewed his AutoPap compliance data from 12/22/2015 through 01/20/2016 which is a total of 30 days during which time he used his machine 28 days with percent used days greater than 4 hours at 87%, indicating very good compliance with an average usage of fibers and 1 minute, residual AHI 0.9 per hour, 95th percentile pressure at 9.8 cm, leak acceptable with the 95th percentile at 16.6 L/m, pressure range of 5-15 cm with EPR.   07/02/2015: He reports snoring and excessive daytime somnolence with nighttime sleep disruption including difficulty maintaining sleep and difficulty going to sleep. His ESS is 16/24 and his fatigue score is 43/63. He has has at times loud snoring. Symptoms have been ongoing for months to perhaps use. He does not wake up rested. He denies morning headaches and has nocturia occasionally. He denies frank restless leg symptoms and does not know if he twitches his legs in his sleep. He lives alone. He is divorced. He has 2 grown sons. He works full-time at a [employer], [employer], 6 AM to 2:30 PM, Monday through Friday. He does not smoke. He drinks alcohol occasionally but has had heavier drinking in the past. He drinks coffee one cup per day and typically no additional caffeine, occasional sodas. Has not lost any significant amount of weight. He typically stays above the 200 pound mark and fluctuates a few pounds at a time. His brother has obstructive sleep apnea and uses a CPAP machine. This year, his son is also been diagnosed with OSA and has a CPAP machine now. His bedtime is usually around 10 or 10:30 and sometimes he has trouble  going to sleep. He has never taken a sleeping pill or over-the-counter sleep aid. His rise time is typically around 4:30 AM. I reviewed your office note from 05/30/2015.  His Past Medical History Is Significant For: Past Medical History:  Diagnosis Date  . Diabetes mellitus without complication ([place])   . Hyperlipemia   . Hypertension     His Past Surgical History Is Significant For: Past Surgical History:  Procedure Laterality Date  . FRACTURE SURGERY     R forearm    His Family History Is Significant For: Family History  Problem Relation Age of Onset  . Lung cancer Father   . Kidney cancer Brother     His Social History Is Significant For: Social History   Socioeconomic History  . Marital status: Divorced    Spouse name: Not on file  . Number of children: 2  . Years of education: College  . Highest education level: Not on file  Occupational History  . Not on file  Social Needs  . Emergency planning/management officer  strain: Not on file  . Food insecurity:    Worry: Not on file    Inability: Not on file  . Transportation needs:    Medical: Not on file    Non-medical: Not on file  Tobacco Use  . Smoking status: Never Smoker  . Smokeless tobacco: Never Used  Substance and Sexual Activity  . Alcohol use: Yes    Alcohol/week: 0.0 oz  . Drug use: No  . Sexual activity: Never    Birth control/protection: Abstinence  Lifestyle  . Physical activity:    Days per week: Not on file    Minutes per session: Not on file  . Stress: Not on file  Relationships  . Social connections:    Talks on phone: Not on file    Gets together: Not on file    Attends religious service: Not on file    Active member of club or organization: Not on file    Attends meetings of clubs or organizations: Not on file    Relationship status: Not on file  Other Topics Concern  . Not on file  Social History Narrative  . Not on file    His Allergies Are:  No Known Allergies:   His Current Medications  Are:  Outpatient Encounter Medications as of 04/12/2018  Medication Sig  . allopurinol (ZYLOPRIM) 100 MG tablet Take 1 tablet (100 mg total) by mouth daily.  Marland Kitchen amLODipine (NORVASC) 10 MG tablet Take 1 tablet (10 mg total) by mouth daily.  Marland Kitchen aspirin EC 81 MG tablet Take 81 mg by mouth daily.  Marland Kitchen atorvastatin (LIPITOR) 80 MG tablet Take 1 tablet (80 mg total) by mouth daily.  . colchicine 0.6 MG tablet Take 2 pills initially with some food, then 1 twice daily. When doing better can taper to once daily for a few days and then discontinue.  Marland Kitchen glipiZIDE (GLUCOTROL) 10 MG tablet TAKE ONE TWICE DAILY FOR DIABETES  . losartan-hydrochlorothiazide (HYZAAR) 100-25 MG tablet Take 1 tablet by mouth daily.  . metFORMIN (GLUCOPHAGE) 1000 MG tablet TAKE ONE TABLET BY MOUTH TWICE DAILY FOR DIABETES.  . Multiple Vitamins-Minerals (ONE DAILY MENS PO)   . naproxen (NAPROSYN) 500 MG tablet Take 1 tablet (500 mg total) by mouth 2 (two) times daily with a meal. (Patient taking differently: Take 500 mg by mouth as needed. )  . traMADol (ULTRAM) 50 MG tablet Take 0.5-1 tablets (25-50 mg total) by mouth every 8 (eight) hours as needed for severe pain.   No facility-administered encounter medications on file as of 04/12/2018.   :  Review of Systems:  Out of a complete 14 point review of systems, all are reviewed and negative with the exception of these symptoms as listed below: Review of Systems  Neurological:       Pt presents today to discuss his cpap. Pt feels like he is sleeping better.    Objective:  Neurological Exam  Physical Exam Physical Examination:   Vitals:   04/12/18 1408  BP: (!) 141/74  Pulse: 98    General Examination: The patient is a very pleasant 72 y.o. male in no acute distress. He appears well-developed and well-nourished and well groomed.   HEENT: Normocephalic, atraumatic, pupils are equal, round and reactive to light and accommodation. Extraocular tracking is good without limitation  to gaze excursion or nystagmus noted. Normal smooth pursuit is noted. Hearing is grossly intact. Face is symmetric with normal facial animation and normal facial sensation. Speech is clear with no dysarthria  noted. There is no hypophonia. There is no lip, neck/head, jaw or voice tremor. Neck is supple with full range of passive and active motion. Oropharynx exam reveals: mild mouth dryness, adequate dental hygiene and moderate airway crowding.    Chest: Clear to auscultation without wheezing, rhonchi or crackles noted.  Heart: S1+S2+0, regular and normal without murmurs, rubs or gallops noted.   Abdomen: Soft, non-tender and non-distended with normal bowel sounds appreciated on auscultation.  Extremities: There is no pitting edema in the distal lower extremities bilaterally.   Skin: Warm and dry without trophic changes noted.  Musculoskeletal: exam reveals no obvious joint deformities, tenderness or joint swelling or erythema. Unremarkable surgical scars right forearm.  Neurologically:  Mental status: The patient is awake, alert and oriented in all 4 spheres. His immediate and remote memory, attention, language skills and fund of knowledge are appropriate. There is no evidence of aphasia, agnosia, apraxia or anomia. Speech is clear with normal prosody and enunciation. Thought process is linear. Mood is normal and affect is normal.  Cranial nerves II - XII are as described above under HEENT exam.  Motor exam: Normal bulk, strength and tone is noted. There is no drift, tremor or rebound. Romberg is negative. Fine motor skills and coordination: intact.   Cerebellar testing: No dysmetria or intention tremor. There is no truncal or gait ataxia.  Sensory exam: intact to light touch in the upper and lower extremities.  Gait, station and balance: He stands easily. No veering to one side is noted. No leaning to one side is noted. Posture is age-appropriate and stance is narrow based. Gait shows  normal stride length and normal pace. No problems turning are noted. Tandem walk is somewhat challenging for him.    Assessment and plan:  In summary, Derrick Becker is a very pleasant 72 year old male with an underlying medical history of type 2 diabetes, hypertension, hyperlipidemia and obesity, who presents for follow-up consultation of his severe obstructive sleep apnea as determined by a HST on 08/15/2015. Unfortunately, he did not have an attended sleep study or CPAP titration study. He has been on AutoPap therapy and has been compliant with it. last year we tried him on CPAP therapy of 10 cm but he could not tolerate it. He stopped using his CPAP but was able to resume AutoPap therapy with success, he is compliant with treatment. He did forget his machine when he went on vacation recently but other than that he is doing well. He has lost some weight and is working on [employer], trying to drink more water rather than tea and sodas. His A1c has come down to below 7 he reports. He has been able to get updated supplies from his DME company recently.  He did a overnight pulse oximetry test in November 2017 with benign results while on AutoPap therapy. I suggested he continue to use his AutoPap regularly and follow-up routinely in one year. I answered all his questions today and he was in agreement.  I spent 25 minutes in total face-to-face time with the patient, more than 50% of which was spent in counseling and coordination of care, reviewing test results, reviewing medication and discussing or reviewing the diagnosis of OSA, its prognosis and treatment options. Pertinent laboratory and imaging test results that were available during this visit with the patient were reviewed by me and considered in my medical decision making (see chart for details).

## 2018-04-19 DIAGNOSIS — G4733 Obstructive sleep apnea (adult) (pediatric): Secondary | ICD-10-CM | POA: Diagnosis not present

## 2018-05-12 DIAGNOSIS — H25812 Combined forms of age-related cataract, left eye: Secondary | ICD-10-CM | POA: Diagnosis not present

## 2018-05-12 DIAGNOSIS — H25813 Combined forms of age-related cataract, bilateral: Secondary | ICD-10-CM | POA: Diagnosis not present

## 2018-05-12 DIAGNOSIS — H40013 Open angle with borderline findings, low risk, bilateral: Secondary | ICD-10-CM | POA: Diagnosis not present

## 2018-05-12 DIAGNOSIS — H25811 Combined forms of age-related cataract, right eye: Secondary | ICD-10-CM | POA: Diagnosis not present

## 2018-05-30 ENCOUNTER — Other Ambulatory Visit: Payer: Self-pay | Admitting: Family Medicine

## 2018-05-30 DIAGNOSIS — H25811 Combined forms of age-related cataract, right eye: Secondary | ICD-10-CM | POA: Diagnosis not present

## 2018-05-30 DIAGNOSIS — H2511 Age-related nuclear cataract, right eye: Secondary | ICD-10-CM | POA: Diagnosis not present

## 2018-05-30 DIAGNOSIS — M1A0790 Idiopathic chronic gout, unspecified ankle and foot, without tophus (tophi): Secondary | ICD-10-CM

## 2018-05-30 NOTE — Telephone Encounter (Signed)
Allopurinol refill Last refill5/3/19  #90; 1 refill PCPDr Carlota Raspberry [facility]

## 2018-06-04 ENCOUNTER — Other Ambulatory Visit: Payer: Self-pay | Admitting: Family Medicine

## 2018-06-07 NOTE — Telephone Encounter (Signed)
Amlodipine 10 mg  refill Last Refill:03/14/18 # 90 with 1 refill Last OV: 03/14/18 PCP: Jacklyn Shell, MD [facility]

## 2018-06-14 ENCOUNTER — Ambulatory Visit: Payer: Medicare HMO | Admitting: Family Medicine

## 2018-07-12 ENCOUNTER — Ambulatory Visit (INDEPENDENT_AMBULATORY_CARE_PROVIDER_SITE_OTHER): Payer: Medicare HMO | Admitting: Family Medicine

## 2018-07-12 ENCOUNTER — Encounter: Payer: Self-pay | Admitting: Family Medicine

## 2018-07-12 ENCOUNTER — Other Ambulatory Visit: Payer: Self-pay

## 2018-07-12 VITALS — BP 128/64 | HR 73 | Temp 98.2°F | Ht 66.0 in | Wt 193.8 lb

## 2018-07-12 DIAGNOSIS — E785 Hyperlipidemia, unspecified: Secondary | ICD-10-CM | POA: Diagnosis not present

## 2018-07-12 DIAGNOSIS — E119 Type 2 diabetes mellitus without complications: Secondary | ICD-10-CM | POA: Diagnosis not present

## 2018-07-12 DIAGNOSIS — Z23 Encounter for immunization: Secondary | ICD-10-CM

## 2018-07-12 LAB — HEMOGLOBIN A1C
ESTIMATED AVERAGE GLUCOSE: 134 mg/dL
Hgb A1c MFr Bld: 6.3 % — ABNORMAL HIGH (ref 4.8–5.6)

## 2018-07-12 MED ORDER — GLIPIZIDE 10 MG PO TABS: ORAL_TABLET | ORAL | 1 refills | Status: DC

## 2018-07-12 NOTE — Progress Notes (Signed)
Subjective:  By signing my name below, I, Moises Blood, attest that this documentation has been prepared under the direction and in the presence of Merri Ray, MD. Electronically Signed: Moises Blood, [place]. 07/12/2018 , 8:52 AM .  Patient was seen in Room 10 .   Patient ID: Derrick Becker, male    DOB: 08-Oct-1945, 72 y.o.   MRN: [id number] Chief Complaint  Patient presents with  . Diabetes    f/u   HPI Derrick Becker is a 72 y.o. male Here for follow up.   Diabetes Lab Results  Component Value Date   HGBA1C 6.5 (H) 03/14/2018   Wt Readings from Last 3 Encounters:  07/12/18 193 lb 12.8 oz (87.9 kg)  04/12/18 198 lb (89.8 kg)  03/14/18 197 lb 9.6 oz (89.6 kg)   His A1C had improved from 7.5 to 6.5 in July. He was continued on same medications which included glipizide 10 mg bid and metformin 1000 mg bid. He is also on a statin and ARB.   Patient states on some mornings, his blood sugar would run low around 69-70s. So, he's been doing half tablet glipizide at night (5 mg) if he doesn't eat a big meal at night. During the day around 1:00-2:00 PM, his sugar still runs a little high around 150-160, and then would just eat a salad for dinner or something light. He's also doing well on metformin 1000 mg bid.   Health maintenance Dentist: mostly natural teeth; hasn't made an appointment yet Flu shot: updated today   Urine micro albumin: 35 in May 2018 Foot exam: today,  Diabetic Foot Exam - Simple   Simple Foot Form Diabetic Foot exam was performed with the following findings:  Yes 07/12/2018  8:29 AM  Visual Inspection See comments:  Yes Sensation Testing Intact to touch and monofilament testing bilaterally:  Yes Pulse Check Posterior Tibialis and Dorsalis pulse intact bilaterally:  Yes Comments Bit of swelling on the feet.     Optho: 10/28/17 Pneumovax: 03/14/18  HTN He takes Hyzaar 100-25 mg qd and amlodipine 10 mg qd. He denies any side effects with these  medications.   Lab Results  Component Value Date   CREATININE 1.18 03/14/2018    Hyperlipidemia Lab Results  Component Value Date   CHOL 130 03/14/2018   HDL 37 (L) 03/14/2018   LDLCALC 79 03/14/2018   TRIG 72 03/14/2018   CHOLHDL 3.5 03/14/2018   Lab Results  Component Value Date   ALT 14 03/14/2018   AST 18 03/14/2018   ALKPHOS 64 03/14/2018   BILITOT 0.2 03/14/2018   He takes Lipitor 80 mg qd. He denies any muscle aches or side effects with it.   Patient Active Problem List   Diagnosis Date Noted  . Hyperlipidemia 10/12/2013  . Diabetes ([place]) 08/23/2012  . HTN (hypertension) 08/23/2012   Past Medical History:  Diagnosis Date  . Diabetes mellitus without complication ([place])   . Hyperlipemia   . Hypertension    Past Surgical History:  Procedure Laterality Date  . FRACTURE SURGERY     R forearm   No Known Allergies Prior to Admission medications   Medication Sig Start Date End Date Taking? Authorizing Provider  allopurinol (ZYLOPRIM) 100 MG tablet TAKE 1 TABLET BY MOUTH EVERY DAY 05/30/18   [name], MD  amLODipine (NORVASC) 10 MG tablet Take 1 tablet (10 mg total) by mouth daily. 03/14/18   [name], MD  aspirin EC 81 MG tablet Take  81 mg by mouth daily.    [provider]  atorvastatin (LIPITOR) 80 MG tablet Take 1 tablet (80 mg total) by mouth daily. 03/14/18   [name], MD  colchicine 0.6 MG tablet Take 2 pills initially with some food, then 1 twice daily. When doing better can taper to once daily for a few days and then discontinue. 12/07/17   [name], MD  glipiZIDE (GLUCOTROL) 10 MG tablet TAKE ONE TWICE DAILY FOR DIABETES 03/14/18   [name], MD  losartan-hydrochlorothiazide (HYZAAR) 100-25 MG tablet Take 1 tablet by mouth daily. 03/14/18   [name], MD  metFORMIN (GLUCOPHAGE) 1000 MG tablet TAKE ONE TABLET BY MOUTH TWICE DAILY FOR DIABETES. 03/14/18   [name], MD  Multiple Vitamins-Minerals  (ONE DAILY MENS PO)  03/02/18   [provider]  naproxen (NAPROSYN) 500 MG tablet Take 1 tablet (500 mg total) by mouth 2 (two) times daily with a meal. Patient taking differently: Take 500 mg by mouth as needed.  02/01/17   Tereasa Coop, PA-C  traMADol (ULTRAM) 50 MG tablet Take 0.5-1 tablets (25-50 mg total) by mouth every 8 (eight) hours as needed for severe pain. 02/01/17   Tereasa Coop, PA-C   Social History   Socioeconomic History  . Marital status: Divorced    Spouse name: Not on file  . Number of children: 2  . Years of education: College  . Highest education level: Not on file  Occupational History  . Not on file  Social Needs  . Financial resource strain: Not on file  . Food insecurity:    Worry: Not on file    Inability: Not on file  . Transportation needs:    Medical: Not on file    Non-medical: Not on file  Tobacco Use  . Smoking status: Never Smoker  . Smokeless tobacco: Never Used  Substance and Sexual Activity  . Alcohol use: Yes    Alcohol/week: 0.0 standard drinks  . Drug use: No  . Sexual activity: Never    Birth control/protection: Abstinence  Lifestyle  . Physical activity:    Days per week: Not on file    Minutes per session: Not on file  . Stress: Not on file  Relationships  . Social connections:    Talks on phone: Not on file    Gets together: Not on file    Attends religious service: Not on file    Active member of club or organization: Not on file    Attends meetings of clubs or organizations: Not on file    Relationship status: Not on file  . Intimate partner violence:    Fear of current or ex partner: Not on file    Emotionally abused: Not on file    Physically abused: Not on file    Forced sexual activity: Not on file  Other Topics Concern  . Not on file  Social History Narrative  . Not on file   Review of Systems  Constitutional: Negative for fatigue and unexpected weight change.  Eyes: Negative for visual  disturbance.  Respiratory: Negative for cough, chest tightness and shortness of breath.   Cardiovascular: Negative for chest pain, palpitations and leg swelling.  Gastrointestinal: Negative for abdominal pain and blood in stool.  Neurological: Negative for dizziness, light-headedness and headaches.       Objective:   Physical Exam  Constitutional: He is oriented to person, place, and time. He appears well-developed and well-nourished.  HENT:  Head: Normocephalic and atraumatic.  Eyes: Pupils are equal, round, and reactive to light. EOM are normal.  Neck: No JVD present. Carotid bruit is not present.  Cardiovascular: Normal rate, regular rhythm and normal heart sounds.  No murmur heard. Pulmonary/Chest: Effort normal and breath sounds normal. He has no rales.  Musculoskeletal: He exhibits no edema.  Neurological: He is alert and oriented to person, place, and time.  Skin: Skin is warm and dry.  Psychiatric: He has a normal mood and affect.  Vitals reviewed.   Vitals:   07/12/18 0824  BP: 128/64  Pulse: 73  Temp: 98.2 F (36.8 C)  TempSrc: Oral  SpO2: 96%  Weight: 193 lb 12.8 oz (87.9 kg)  Height: 5\' 6"  (1.676 m)       Assessment & Plan:   Derrick Becker is a 72 y.o. male Type 2 diabetes mellitus without complication, without long-term current use of insulin ([place]) - Plan: Hemoglobin A1c, Microalbumin/Creatinine Ratio, Urine, glipiZIDE (GLUCOTROL) 10 MG tablet, CANCELED: Comprehensive metabolic panel  -Improved A1c last visit, few borderline lows noted as above in the morning.  No lows during the day.  Decrease glipizide to 5 mg at evening meal, okay to continue 10 mg with morning meal but cautioned on daytime hypoglycemia.  Continue metformin same dose, other meds same dose with recheck in 3 months.  Is still well controlled at that time, can space visits to 6 months.  Hyperlipidemia, unspecified hyperlipidemia type - Plan: CANCELED: Lipid panel, CANCELED: Comprehensive  metabolic panel  -Tolerating statin, no changes, lab work from 3 months ago reviewed  Need for influenza vaccination - Plan: Flu vaccine HIGH DOSE PF (Fluzone High dose)   Meds ordered this encounter  Medications  . glipiZIDE (GLUCOTROL) 10 MG tablet    Sig: TAKE ONE tablet in the morning with morning meal, 1/2 tablet with evening meal.    Dispense:  135 tablet    Refill:  1   Patient Instructions    Decrease glipizide to 1/2 pill with evening meal, continue full pill in the morning.  No other med changes.  I will check your hemoglobin A1c, but please follow-up in 3 months.  Thank you for coming in today.   If you have lab work done today you will be contacted with your lab results within the next 2 weeks.  If you have not heard from [place] then please contact us. The fastest way to get your results is to register for My Chart.   IF you received an x-ray today, you will receive an invoice from [facility]. Please contact [facility] at [phone] with questions or concerns regarding your invoice.   IF you received labwork today, you will receive an invoice from [facility]. Please contact [facility] at [phone] with questions or concerns regarding your invoice.   Our billing staff will not be able to assist you with questions regarding bills from these companies.  You will be contacted with the lab results as soon as they are available. The fastest way to get your results is to activate your My Chart account. Instructions are located on the last page of this paperwork. If you have not heard from [place] regarding the results in 2 weeks, please contact this office.       I personally performed the services described in this documentation, which was scribed in my presence. The recorded information has been reviewed and considered for accuracy and completeness, addended by me as needed, and agree with  information above.  Signed,   Merri Ray, MD [facility]  [place].  07/12/18 8:58 AM

## 2018-07-12 NOTE — Patient Instructions (Addendum)
  Decrease glipizide to 1/2 pill with evening meal, continue full pill in the morning.  No other med changes.  I will check your hemoglobin A1c, but please follow-up in 3 months.  Thank you for coming in today.   If you have lab work done today you will be contacted with your lab results within the next 2 weeks.  If you have not heard from [place] then please contact us. The fastest way to get your results is to register for My Chart.   IF you received an x-ray today, you will receive an invoice from [facility] Radiology. Please contact [facility] Radiology at [phone] with questions or concerns regarding your invoice.   IF you received labwork today, you will receive an invoice from [place]. Please contact [facility] at [phone] with questions or concerns regarding your invoice.   Our billing staff will not be able to assist you with questions regarding bills from these companies.  You will be contacted with the lab results as soon as they are available. The fastest way to get your results is to activate your My Chart account. Instructions are located on the last page of this paperwork. If you have not heard from [place] regarding the results in 2 weeks, please contact this office.

## 2018-07-13 LAB — MICROALBUMIN / CREATININE URINE RATIO
Creatinine, Urine: 117.6 mg/dL
MICROALB/CREAT RATIO: 7.8 mg/g{creat} (ref 0.0–30.0)
Microalbumin, Urine: 9.2 ug/mL

## 2018-08-01 ENCOUNTER — Other Ambulatory Visit: Payer: Self-pay | Admitting: Family Medicine

## 2018-08-01 DIAGNOSIS — I10 Essential (primary) hypertension: Secondary | ICD-10-CM

## 2018-08-02 NOTE — Telephone Encounter (Signed)
Requested Prescriptions  Pending Prescriptions Disp Refills  . losartan-hydrochlorothiazide (HYZAAR) 100-25 MG tablet [Pharmacy Med Name: LOSARTAN POTASSIUM/HYDROCHLOROTHIAZIDE 100-25 MG Tablet] 90 tablet 0    Sig: TAKE 1 TABLET EVERY DAY     Cardiovascular: ARB + Diuretic Combos Passed - 08/01/2018  5:22 PM      Passed - K in normal range and within 180 days    Potassium  Date Value Ref Range Status  03/14/2018 4.8 3.5 - 5.2 mmol/L Final         Passed - Na in normal range and within 180 days    Sodium  Date Value Ref Range Status  03/14/2018 141 134 - 144 mmol/L Final         Passed - Cr in normal range and within 180 days    Creat  Date Value Ref Range Status  02/17/2016 0.83 0.70 - 1.18 mg/dL Final   Creatinine, Ser  Date Value Ref Range Status  03/14/2018 1.18 0.76 - 1.27 mg/dL Final         Passed - Ca in normal range and within 180 days    Calcium  Date Value Ref Range Status  03/14/2018 9.6 8.6 - 10.2 mg/dL Final         Passed - Patient is not pregnant      Passed - Last BP in normal range    BP Readings from Last 1 Encounters:  07/12/18 128/64         Passed - Valid encounter within last 6 months    Recent Outpatient Visits          3 weeks ago Type 2 diabetes mellitus without complication, without long-term current use of insulin ([place])   [facility] [place], Ranell Patrick, MD   4 months ago Gout, unspecified cause, unspecified chronicity, unspecified site   [facility] [place], Ranell Patrick, MD   7 months ago Type 2 diabetes mellitus without complication, without long-term current use of insulin [facility])   [facility] at [place], Ranell Patrick, MD   11 months ago Type 2 diabetes mellitus without complication, without long-term current use of insulin [facility])   [facility] [place], Ranell Patrick, MD   1 year ago Type 2 diabetes mellitus without complication, without long-term current use of insulin [facility])   [facility],  Laurey Arrow, MD      Future Appointments            In 2 months Carlota Raspberry Ranell Patrick, MD Primary Care at [place], [place]

## 2018-08-10 DIAGNOSIS — H40013 Open angle with borderline findings, low risk, bilateral: Secondary | ICD-10-CM | POA: Diagnosis not present

## 2018-09-14 ENCOUNTER — Other Ambulatory Visit: Payer: Self-pay | Admitting: Family Medicine

## 2018-09-14 DIAGNOSIS — E119 Type 2 diabetes mellitus without complications: Secondary | ICD-10-CM

## 2018-09-19 ENCOUNTER — Other Ambulatory Visit: Payer: Self-pay | Admitting: Family Medicine

## 2018-09-19 DIAGNOSIS — I10 Essential (primary) hypertension: Secondary | ICD-10-CM

## 2018-09-19 DIAGNOSIS — E785 Hyperlipidemia, unspecified: Secondary | ICD-10-CM

## 2018-09-20 NOTE — Telephone Encounter (Signed)
Requested Prescriptions  Pending Prescriptions Disp Refills  . atorvastatin (LIPITOR) 80 MG tablet [Pharmacy Med Name: ATORVASTATIN CALCIUM 80 MG Tablet] 90 tablet 1    Sig: TAKE 1 TABLET EVERY DAY     Cardiovascular:  Antilipid - Statins Failed - 09/19/2018  1:15 PM      Failed - HDL in normal range and within 360 days    HDL  Date Value Ref Range Status  03/14/2018 37 (L) >39 mg/dL Final         Passed - Total Cholesterol in normal range and within 360 days    Cholesterol, Total  Date Value Ref Range Status  03/14/2018 130 100 - 199 mg/dL Final         Passed - LDL in normal range and within 360 days    LDL Calculated  Date Value Ref Range Status  03/14/2018 79 0 - 99 mg/dL Final         Passed - Triglycerides in normal range and within 360 days    Triglycerides  Date Value Ref Range Status  03/14/2018 72 0 - 149 mg/dL Final         Passed - Patient is not pregnant      Passed - Valid encounter within last 12 months    Recent Outpatient Visits          2 months ago Type 2 diabetes mellitus without complication, without long-term current use of insulin ([place])   [facility] [place], Ranell Patrick, MD   6 months ago Gout, unspecified cause, unspecified chronicity, unspecified site   [facility] [place], Ranell Patrick, MD   9 months ago Type 2 diabetes mellitus without complication, without long-term current use of insulin [facility])   [facility] at [place], Ranell Patrick, MD   1 year ago Type 2 diabetes mellitus without complication, without long-term current use of insulin [facility])   [facility] [place], Ranell Patrick, MD   1 year ago Type 2 diabetes mellitus without complication, without long-term current use of insulin [facility])   [facility], Laurey Arrow, MD      Future Appointments            In 3 weeks [name], MD Primary Care at [place], [place]         . amLODipine ([place]) 10 MG tablet [Pharmacy Med Name: AMLODIPINE BESYLATE 10  MG Tablet] 90 tablet 1    Sig: TAKE 1 TABLET EVERY DAY     Cardiovascular:  Calcium Channel Blockers Passed - 09/19/2018  1:15 PM      Passed - Last BP in normal range    BP Readings from Last 1 Encounters:  07/12/18 128/64         Passed - Valid encounter within last 6 months    Recent Outpatient Visits          2 months ago Type 2 diabetes mellitus without complication, without long-term current use of insulin ([place])   [facility] [place], Ranell Patrick, MD   6 months ago Gout, unspecified cause, unspecified chronicity, unspecified site   [facility] [place], Ranell Patrick, MD   9 months ago Type 2 diabetes mellitus without complication, without long-term current use of insulin [facility]   [facility] [place], Ranell Patrick, MD   1 year ago Type 2 diabetes mellitus without complication, without long-term current use of insulin ([place])   Primary Care at  [place], MD   1 year ago Type 2 diabetes mellitus without complication, without long-term current use of insulin [facility])   [facility] at [facility], Laurey Arrow, MD      Future Appointments            In 3 weeks Carlota Raspberry Ranell Patrick, MD Primary Care at [place], [facility]

## 2018-10-06 ENCOUNTER — Other Ambulatory Visit: Payer: Self-pay | Admitting: Family Medicine

## 2018-10-06 DIAGNOSIS — I10 Essential (primary) hypertension: Secondary | ICD-10-CM

## 2018-10-07 NOTE — Telephone Encounter (Signed)
Requested medication (s) are due for refill today: yes  Requested medication (s) are on the active medication list: yes  Last refill:  08/02/18 for 90 tabs  Future visit scheduled: yes  Notes to clinic:  Cardiovascular: AB + Diuretic combos failed  Requested Prescriptions  Pending Prescriptions Disp Refills   losartan-hydrochlorothiazide (HYZAAR) 100-25 MG tablet [Pharmacy Med Name: LOSARTAN POTASSIUM/HYDROCHLOROTHIAZIDE 100-25 MG Tablet] 90 tablet 0    Sig: TAKE 1 TABLET EVERY DAY     Cardiovascular: ARB + Diuretic Combos Failed - 10/06/2018  2:02 PM      Failed - K in normal range and within 180 days    Potassium  Date Value Ref Range Status  03/14/2018 4.8 3.5 - 5.2 mmol/L Final         Failed - Na in normal range and within 180 days    Sodium  Date Value Ref Range Status  03/14/2018 141 134 - 144 mmol/L Final         Failed - Cr in normal range and within 180 days    Creat  Date Value Ref Range Status  02/17/2016 0.83 0.70 - 1.18 mg/dL Final   Creatinine, Ser  Date Value Ref Range Status  03/14/2018 1.18 0.76 - 1.27 mg/dL Final         Failed - Ca in normal range and within 180 days    Calcium  Date Value Ref Range Status  03/14/2018 9.6 8.6 - 10.2 mg/dL Final         Passed - Patient is not pregnant      Passed - Last BP in normal range    BP Readings from Last 1 Encounters:  07/12/18 128/64         Passed - Valid encounter within last 6 months    Recent Outpatient Visits          2 months ago Type 2 diabetes mellitus without complication, without long-term current use of insulin ([place])   [facility] [place], Ranell Patrick, MD   6 months ago Gout, unspecified cause, unspecified chronicity, unspecified site   [facility] [place], Ranell Patrick, MD   10 months ago Type 2 diabetes mellitus without complication, without long-term current use of insulin [facility])   [facility] at [place], Ranell Patrick, MD   1 year ago Type 2 diabetes mellitus  without complication, without long-term current use of insulin [facility])   [facility] at [place], Ranell Patrick, MD   1 year ago Type 2 diabetes mellitus without complication, without long-term current use of insulin [facility])   [facility] at [facility], Laurey Arrow, MD      Future Appointments            In 6 days [name], MD Primary Care at [place], [facility]

## 2018-10-07 NOTE — Telephone Encounter (Signed)
Losartan potassium/hydrochlorothiazide tablet appears to be the order that was placed today.  60 appear to have been sent into pharmacy.  No further orders placed.

## 2018-10-13 ENCOUNTER — Ambulatory Visit (INDEPENDENT_AMBULATORY_CARE_PROVIDER_SITE_OTHER): Payer: Medicare HMO | Admitting: Family Medicine

## 2018-10-13 ENCOUNTER — Other Ambulatory Visit: Payer: Self-pay

## 2018-10-13 ENCOUNTER — Encounter: Payer: Self-pay | Admitting: Family Medicine

## 2018-10-13 VITALS — BP 114/60 | HR 72 | Temp 98.2°F | Resp 16 | Ht 65.25 in | Wt 197.8 lb

## 2018-10-13 DIAGNOSIS — E1065 Type 1 diabetes mellitus with hyperglycemia: Secondary | ICD-10-CM

## 2018-10-13 DIAGNOSIS — E108 Type 1 diabetes mellitus with unspecified complications: Secondary | ICD-10-CM

## 2018-10-13 DIAGNOSIS — D2230 Melanocytic nevi of unspecified part of face: Secondary | ICD-10-CM | POA: Diagnosis not present

## 2018-10-13 DIAGNOSIS — I10 Essential (primary) hypertension: Secondary | ICD-10-CM

## 2018-10-13 DIAGNOSIS — E785 Hyperlipidemia, unspecified: Secondary | ICD-10-CM | POA: Diagnosis not present

## 2018-10-13 DIAGNOSIS — M1A0790 Idiopathic chronic gout, unspecified ankle and foot, without tophus (tophi): Secondary | ICD-10-CM | POA: Diagnosis not present

## 2018-10-13 DIAGNOSIS — IMO0002 Reserved for concepts with insufficient information to code with codable children: Secondary | ICD-10-CM

## 2018-10-13 DIAGNOSIS — E119 Type 2 diabetes mellitus without complications: Secondary | ICD-10-CM

## 2018-10-13 LAB — COMPREHENSIVE METABOLIC PANEL
A/G RATIO: 2 (ref 1.2–2.2)
ALT: 19 IU/L (ref 0–44)
AST: 17 IU/L (ref 0–40)
Albumin: 4.6 g/dL (ref 3.5–4.8)
Alkaline Phosphatase: 64 IU/L (ref 39–117)
BUN / CREAT RATIO: 17 (ref 10–24)
BUN: 19 mg/dL (ref 8–27)
Bilirubin Total: 0.3 mg/dL (ref 0.0–1.2)
CO2: 21 mmol/L (ref 20–29)
Calcium: 9.5 mg/dL (ref 8.6–10.2)
Chloride: 103 mmol/L (ref 96–106)
Creatinine, Ser: 1.14 mg/dL (ref 0.76–1.27)
GFR calc Af Amer: 74 mL/min/{1.73_m2} (ref >59)
GFR calc non Af Amer: 64 mL/min/{1.73_m2} (ref >59)
Globulin, Total: 2.3 g/dL (ref 1.5–4.5)
Glucose: 132 mg/dL — ABNORMAL HIGH (ref 65–99)
Potassium: 4.6 mmol/L (ref 3.5–5.2)
Sodium: 140 mmol/L (ref 134–144)
Total Protein: 6.9 g/dL (ref 6.0–8.5)

## 2018-10-13 LAB — LIPID PANEL
CHOL/HDL RATIO: 4 ratio (ref 0.0–5.0)
Cholesterol, Total: 141 mg/dL (ref 100–199)
HDL: 35 mg/dL — AB (ref >39)
LDL CALC: 89 mg/dL (ref 0–99)
Triglycerides: 87 mg/dL (ref 0–149)
VLDL CHOLESTEROL CAL: 17 mg/dL (ref 5–40)

## 2018-10-13 LAB — POCT GLYCOSYLATED HEMOGLOBIN (HGB A1C): Hemoglobin A1C: 7 % — AB (ref 4.0–5.6)

## 2018-10-13 MED ORDER — METFORMIN HCL 1000 MG PO TABS: 1000.0000 mg | ORAL_TABLET | Freq: Two times a day (BID) | ORAL | 1 refills | Status: DC

## 2018-10-13 MED ORDER — ATORVASTATIN CALCIUM 80 MG PO TABS: 80.0000 mg | ORAL_TABLET | Freq: Every day | ORAL | 1 refills | Status: DC

## 2018-10-13 MED ORDER — ALLOPURINOL 100 MG PO TABS: 100.0000 mg | ORAL_TABLET | Freq: Every day | ORAL | 1 refills | Status: DC

## 2018-10-13 MED ORDER — LOSARTAN POTASSIUM-HCTZ 100-25 MG PO TABS: 1.0000 | ORAL_TABLET | Freq: Every day | ORAL | 1 refills | Status: DC

## 2018-10-13 MED ORDER — GLIPIZIDE 10 MG PO TABS: ORAL_TABLET | ORAL | 1 refills | Status: DC

## 2018-10-13 MED ORDER — AMLODIPINE BESYLATE 10 MG PO TABS: 10.0000 mg | ORAL_TABLET | Freq: Every day | ORAL | 1 refills | Status: DC

## 2018-10-13 NOTE — Progress Notes (Signed)
Subjective:    Patient ID: Derrick Becker, male    DOB: [birth year], 73 y.o.   MRN: [id number]  HPI Derrick Becker is a 73 y.o. male Presents today for: Chief Complaint  Patient presents with  . Diabetes    f/u 3 mos, (pt states that he will have report faxed over from eye exam when he goes, it's already scheduled)   Diabetes:  He is on ARB, statin. Takes Glucotrol 10 mg in the morning 5 mg in evening, metformin 1000 mg twice daily.  He had decreased his evening dose of glipizide due to borderline lows when discussed in October. Microalbumin: Normal microalbumin creatinine ratio July 12, 2018 Optho, foot exam, pneumovax: Has upcoming ophthalmology exam.  Foot exam 07/12/2018, Pneumovax in March 06, 2018. Some decreased diet adherence over the holidays. Home CBG 130-140 in the morning. Plans diet changes.  . Wt Readings from Last 3 Encounters:  10/13/18 197 lb 12.8 oz (89.7 kg)  07/12/18 193 lb 12.8 oz (87.9 kg)  04/12/18 198 lb (89.8 kg)    Lab Results  Component Value Date   HGBA1C 6.3 (H) 07/12/2018   HGBA1C 6.5 (H) 03/14/2018   HGBA1C 7.5 (H) 12/07/2017   Lab Results  Component Value Date   MICROALBUR 18.2 (H) 12/22/2014   LDLCALC 79 03/14/2018   CREATININE 1.18 03/14/2018   Hyperlipidemia:  Lab Results  Component Value Date   CHOL 130 03/14/2018   HDL 37 (L) 03/14/2018   LDLCALC 79 03/14/2018   TRIG 72 03/14/2018   CHOLHDL 3.5 03/14/2018   Lab Results  Component Value Date   ALT 14 03/14/2018   AST 18 03/14/2018   ALKPHOS 64 03/14/2018   BILITOT 0.2 03/14/2018  On Lipitor 80 mg daily.   No new side effects of meds.   Hypertension: BP Readings from Last 3 Encounters:  10/13/18 114/60  07/12/18 128/64  04/12/18 (!) 141/74   Lab Results  Component Value Date   CREATININE 1.18 03/14/2018   On amlodipine 10 mg daily, losartan HCT 100/25 mg daily. No new side effects.   Bump on left face - enlarging past few months. Picks at area at times. No d/c.     Patient Active Problem List   Diagnosis Date Noted  . Hyperlipidemia 10/12/2013  . Diabetes ([place]) 08/23/2012  . HTN (hypertension) 08/23/2012   Past Medical History:  Diagnosis Date  . Diabetes mellitus without complication ([place])   . Hyperlipemia   . Hypertension    Past Surgical History:  Procedure Laterality Date  . FRACTURE SURGERY     R forearm   No Known Allergies Prior to Admission medications   Medication Sig Start Date End Date Taking? Authorizing Provider  allopurinol (ZYLOPRIM) 100 MG tablet TAKE 1 TABLET BY MOUTH EVERY DAY 05/30/18  Yes [name], MD  amLODipine (NORVASC) 10 MG tablet TAKE 1 TABLET EVERY DAY 09/20/18  Yes [name], MD  aspirin EC 81 MG tablet Take 81 mg by mouth daily.   Yes [provider]  atorvastatin (LIPITOR) 80 MG tablet TAKE 1 TABLET EVERY DAY 09/20/18  Yes [name], MD  colchicine 0.6 MG tablet Take 2 pills initially with some food, then 1 twice daily. When doing better can taper to once daily for a few days and then discontinue. 12/07/17  Yes [name], MD  glipiZIDE (GLUCOTROL) 10 MG tablet TAKE ONE tablet in the morning with morning meal, 1/2 tablet with evening meal. 07/12/18  Yes [name], MD  losartan-hydrochlorothiazide [facility]) 100-25 MG tablet TAKE 1 TABLET EVERY DAY 10/07/18  Yes [name], MD  metFORMIN (GLUCOPHAGE) 1000 MG tablet TAKE ONE TABLET BY MOUTH TWICE DAILY FOR DIABETES. 09/15/18  Yes [name], MD  Multiple Vitamins-Minerals (ONE DAILY MENS PO)  03/02/18  Yes [provider]   Social History   Socioeconomic History  . Marital status: Divorced    Spouse name: Not on file  . Number of children: 2  . Years of education: College  . Highest education level: Not on file  Occupational History  . Not on file  Social Needs  . Financial resource strain: Not on file  . Food insecurity:    Worry: Not on file    Inability: Not on file  . Transportation  needs:    Medical: Not on file    Non-medical: Not on file  Tobacco Use  . Smoking status: Never Smoker  . Smokeless tobacco: Never Used  Substance and Sexual Activity  . Alcohol use: Yes    Alcohol/week: 0.0 standard drinks  . Drug use: No  . Sexual activity: Never    Birth control/protection: Abstinence  Lifestyle  . Physical activity:    Days per week: Not on file    Minutes per session: Not on file  . Stress: Not on file  Relationships  . Social connections:    Talks on phone: Not on file    Gets together: Not on file    Attends religious service: Not on file    Active member of club or organization: Not on file    Attends meetings of clubs or organizations: Not on file    Relationship status: Not on file  . Intimate partner violence:    Fear of current or ex partner: Not on file    Emotionally abused: Not on file    Physically abused: Not on file    Forced sexual activity: Not on file  Other Topics Concern  . Not on file  Social History Narrative  . Not on file    Review of Systems  Constitutional: Negative for fatigue and unexpected weight change.  Eyes: Negative for visual disturbance.  Respiratory: Negative for cough, chest tightness and shortness of breath.   Cardiovascular: Negative for chest pain, palpitations and leg swelling.  Gastrointestinal: Negative for abdominal pain and blood in stool.  Neurological: Negative for dizziness, light-headedness and headaches.       Objective:   Physical Exam Vitals signs reviewed.  Constitutional:      Appearance: He is well-developed.  HENT:     Head: Normocephalic and atraumatic.  Eyes:     Pupils: Pupils are equal, round, and reactive to light.  Neck:     Vascular: No carotid bruit or JVD.  Cardiovascular:     Rate and Rhythm: Normal rate and regular rhythm.     Heart sounds: Normal heart sounds. No murmur.  Pulmonary:     Effort: Pulmonary effort is normal.     Breath sounds: Normal breath sounds. No  rales.  Skin:    General: Skin is warm and dry.       Neurological:     Mental Status: He is alert and oriented to person, place, and time.    Vitals:   10/13/18 0820 10/13/18 0847  BP: (!) 145/77 114/60  Pulse: 72   Resp: 16   Temp: (!) 97.5 F (36.4 C) 98.2 F (36.8 C)  TempSrc: Oral  Oral  SpO2: 97%   Weight: 197 lb 12.8 oz (89.7 kg)   Height: 5' 5.25" (1.657 m)    Results for orders placed or performed in visit on 10/13/18  POCT glycosylated hemoglobin (Hb A1C)  Result Value Ref Range   Hemoglobin A1C 7.0 (A) 4.0 - 5.6 %   HbA1c POC (<> result, manual entry)     HbA1c, POC (prediabetic range)     HbA1c, POC (controlled diabetic range)          Assessment & Plan:  Derrick Becker is a 73 y.o. male Type 2 diabetes mellitus without complication, without long-term current use of insulin (HCC) - Plan: POCT glycosylated hemoglobin (Hb A1C) Type I diabetes mellitus with complication, uncontrolled (HCC)  -Borderline control, plans on changing diet.  No med changes for now.  Hyperlipidemia, unspecified hyperlipidemia type - Plan: Lipid panel, Comprehensive metabolic panel  -Tolerating current regimen, no changes.  Essential hypertension  -Stable on recheck.  No changes  Nevus of face - Plan: Ambulatory referral to Dermatology  -Refer to dermatology.  Flesh-colored with slight waxy appearance.  Differential includes basal cell.  No orders of the defined types were placed in this encounter.  Patient Instructions     I will refer you to dermatology to evaluate the face lesion/bump.  Working on diet should help with diabetes control.  Exercise/low intensity activity 150 minutes/week.   Thank you for coming in today.   If you have lab work done today you will be contacted with your lab results within the next 2 weeks.  If you have not heard from [place] then please contact us. The fastest way to get your results is to register for My Chart.   IF you received an x-ray  today, you will receive an invoice from [facility]. Please contact [facility] Radiology at [phone] with questions or concerns regarding your invoice.   IF you received labwork today, you will receive an invoice from [place]. Please contact [facility] at [phone] with questions or concerns regarding your invoice.   Our billing staff will not be able to assist you with questions regarding bills from these companies.  You will be contacted with the lab results as soon as they are available. The fastest way to get your results is to activate your My Chart account. Instructions are located on the last page of this paperwork. If you have not heard from [place] regarding the results in 2 weeks, please contact this office.       Signed,   Merri Ray, MD Primary Care at [place].  10/13/18 9:27 AM

## 2018-10-13 NOTE — Patient Instructions (Addendum)
  I will refer you to dermatology to evaluate the face lesion/bump.  Working on diet should help with diabetes control.  Exercise/low intensity activity 150 minutes/week.   Thank you for coming in today.   If you have lab work done today you will be contacted with your lab results within the next 2 weeks.  If you have not heard from [place] then please contact us. The fastest way to get your results is to register for My Chart.   IF you received an x-ray today, you will receive an invoice from [facility] Radiology. Please contact [facility] Radiology at [phone] with questions or concerns regarding your invoice.   IF you received labwork today, you will receive an invoice from [place]. Please contact [facility] at [phone] with questions or concerns regarding your invoice.   Our billing staff will not be able to assist you with questions regarding bills from these companies.  You will be contacted with the lab results as soon as they are available. The fastest way to get your results is to activate your My Chart account. Instructions are located on the last page of this paperwork. If you have not heard from [place] regarding the results in 2 weeks, please contact this office.

## 2018-10-19 ENCOUNTER — Other Ambulatory Visit: Payer: Self-pay | Admitting: *Deleted

## 2018-10-19 ENCOUNTER — Telehealth: Payer: Self-pay | Admitting: Family Medicine

## 2018-10-19 DIAGNOSIS — E785 Hyperlipidemia, unspecified: Secondary | ICD-10-CM

## 2018-10-19 DIAGNOSIS — M1A0790 Idiopathic chronic gout, unspecified ankle and foot, without tophus (tophi): Secondary | ICD-10-CM

## 2018-10-19 DIAGNOSIS — I10 Essential (primary) hypertension: Secondary | ICD-10-CM

## 2018-10-19 DIAGNOSIS — E119 Type 2 diabetes mellitus without complications: Secondary | ICD-10-CM

## 2018-10-19 MED ORDER — ALLOPURINOL 100 MG PO TABS: 100.0000 mg | ORAL_TABLET | Freq: Every day | ORAL | 1 refills | Status: DC

## 2018-10-19 MED ORDER — GLIPIZIDE 10 MG PO TABS: ORAL_TABLET | ORAL | 1 refills | Status: DC

## 2018-10-19 MED ORDER — AMLODIPINE BESYLATE 10 MG PO TABS: 10.0000 mg | ORAL_TABLET | Freq: Every day | ORAL | 1 refills | Status: DC

## 2018-10-19 MED ORDER — ATORVASTATIN CALCIUM 80 MG PO TABS: 80.0000 mg | ORAL_TABLET | Freq: Every day | ORAL | 1 refills | Status: DC

## 2018-10-19 MED ORDER — LOSARTAN POTASSIUM-HCTZ 100-25 MG PO TABS: 1.0000 | ORAL_TABLET | Freq: Every day | ORAL | 1 refills | Status: DC

## 2018-10-19 MED ORDER — METFORMIN HCL 1000 MG PO TABS: 1000.0000 mg | ORAL_TABLET | Freq: Two times a day (BID) | ORAL | 1 refills | Status: DC

## 2018-10-19 NOTE — Telephone Encounter (Signed)
Medications have been forwarded to patient's mail order pharmacy.

## 2018-10-19 NOTE — Telephone Encounter (Signed)
Copied from [place] [id number]. Topic: Quick Communication - See Telephone Encounter >> Oct 19, 2018 11:21 AM Bea Graff, NT wrote: CRM for notification. See Telephone encounter for: 10/19/18. Pt states that the prescriptions sent to [facility] on 10/13/2018 were sent to the wrong pharmacy and all of them need to go to [facility]. The meds are: allopurinol (ZYLOPRIM) 100 MG tablet, amLODipine (NORVASC) 10 MG tablet, atorvastatin (LIPITOR) 80 MG tablet, glipiZIDE (GLUCOTROL) 10 MG tablet, losartan-hydrochlorothiazide (HYZAAR) 100-25 MG tablet, and metFORMIN (GLUCOPHAGE) 1000 MG tablet. Please advise.

## 2018-10-19 NOTE — Progress Notes (Signed)
medications forwarded to mail order pharmacy

## 2018-10-24 DIAGNOSIS — G4733 Obstructive sleep apnea (adult) (pediatric): Secondary | ICD-10-CM | POA: Diagnosis not present

## 2018-12-16 ENCOUNTER — Encounter: Payer: Self-pay | Admitting: Family Medicine

## 2018-12-16 ENCOUNTER — Ambulatory Visit (INDEPENDENT_AMBULATORY_CARE_PROVIDER_SITE_OTHER): Payer: Medicare HMO | Admitting: Family Medicine

## 2018-12-16 ENCOUNTER — Ambulatory Visit (INDEPENDENT_AMBULATORY_CARE_PROVIDER_SITE_OTHER): Payer: Medicare HMO

## 2018-12-16 ENCOUNTER — Other Ambulatory Visit: Payer: Self-pay

## 2018-12-16 VITALS — BP 138/80 | HR 105 | Temp 100.5°F | Resp 18 | Ht 65.25 in | Wt 202.0 lb

## 2018-12-16 DIAGNOSIS — R058 Other specified cough: Secondary | ICD-10-CM

## 2018-12-16 DIAGNOSIS — J181 Lobar pneumonia, unspecified organism: Secondary | ICD-10-CM

## 2018-12-16 DIAGNOSIS — R509 Fever, unspecified: Secondary | ICD-10-CM

## 2018-12-16 DIAGNOSIS — E119 Type 2 diabetes mellitus without complications: Secondary | ICD-10-CM | POA: Diagnosis not present

## 2018-12-16 DIAGNOSIS — R05 Cough: Secondary | ICD-10-CM | POA: Diagnosis not present

## 2018-12-16 DIAGNOSIS — J189 Pneumonia, unspecified organism: Secondary | ICD-10-CM

## 2018-12-16 LAB — POCT INFLUENZA A/B
Influenza A, POC: NEGATIVE
Influenza B, POC: NEGATIVE

## 2018-12-16 MED ORDER — GUAIFENESIN ER 600 MG PO TB12: 600.0000 mg | ORAL_TABLET | Freq: Two times a day (BID) | ORAL | 0 refills | Status: DC

## 2018-12-16 MED ORDER — AZITHROMYCIN 250 MG PO TABS: ORAL_TABLET | ORAL | 0 refills | Status: DC

## 2018-12-16 NOTE — Patient Instructions (Addendum)
  CLINICAL DATA:  Productive cough.  EXAM: CHEST - 2 VIEW  COMPARISON:  None.  FINDINGS: Normal sized heart. Minimal right lateral pleural thickening or fluid and minimal left basilar linear atelectasis or scarring. Otherwise, clear lungs. Thoracic spine degenerative changes.  IMPRESSION: 1. Minimal right pleural thickening or fluid. 2. Minimal left basilar linear atelectasis or scarring.   Electronically Signed   By: Claudie Revering M.D.   On: 12/16/2018 14:35   If you have lab work done today you will be contacted with your lab results within the next 2 weeks.  If you have not heard from [place] then please contact us. The fastest way to get your results is to register for My Chart.   IF you received an x-ray today, you will receive an invoice from [facility] Radiology. Please contact [facility] Radiology at [phone] with questions or concerns regarding your invoice.   IF you received labwork today, you will receive an invoice from [place]. Please contact [facility] at [phone] with questions or concerns regarding your invoice.   Our billing staff will not be able to assist you with questions regarding bills from these companies.  You will be contacted with the lab results as soon as they are available. The fastest way to get your results is to activate your My Chart account. Instructions are located on the last page of this paperwork. If you have not heard from [place] regarding the results in 2 weeks, please contact this office.

## 2018-12-16 NOTE — Progress Notes (Signed)
Established Patient Office Visit  Subjective:  Patient ID: Derrick Becker, male    DOB: 1946/06/22  Age: 73 y.o. MRN: [id number]  CC:  Chief Complaint  Patient presents with  . Nasal Congestion    watery eyes started wednesday did ride a train from [facility] over weekend   . Cough    HPI Derrick Becker presents for   Pt reports coughing with mucus and congestion on Wednesday 2 days ago  States that he had  He is only taking aspirin right now  He tried tylenol cold and flu States that he had feverish episode Took the train back from [place] Saturday night 6 days ago Last dose of aspirin was this morning  He denies a history of asthma He is a non smoker  He has diabetes and hypertension so he stopped the tylenol cold and sinus Lab Results  Component Value Date   HGBA1C 7.0 (A) 10/13/2018    Past Medical History:  Diagnosis Date  . Diabetes mellitus without complication ([place])   . Hyperlipemia   . Hypertension     Past Surgical History:  Procedure Laterality Date  . FRACTURE SURGERY     R forearm    Family History  Problem Relation Age of Onset  . Lung cancer Father   . Kidney cancer Brother     Social History   Socioeconomic History  . Marital status: Divorced    Spouse name: Not on file  . Number of children: 2  . Years of education: College  . Highest education level: Not on file  Occupational History  . Not on file  Social Needs  . Financial resource strain: Not on file  . Food insecurity:    Worry: Not on file    Inability: Not on file  . Transportation needs:    Medical: Not on file    Non-medical: Not on file  Tobacco Use  . Smoking status: Never Smoker  . Smokeless tobacco: Never Used  Substance and Sexual Activity  . Alcohol use: Yes    Alcohol/week: 0.0 standard drinks  . Drug use: No  . Sexual activity: Never    Birth control/protection: Abstinence  Lifestyle  . Physical activity:    Days per week: Not on file    Minutes per session:  Not on file  . Stress: Not on file  Relationships  . Social connections:    Talks on phone: Not on file    Gets together: Not on file    Attends religious service: Not on file    Active member of club or organization: Not on file    Attends meetings of clubs or organizations: Not on file    Relationship status: Not on file  . Intimate partner violence:    Fear of current or ex partner: Not on file    Emotionally abused: Not on file    Physically abused: Not on file    Forced sexual activity: Not on file  Other Topics Concern  . Not on file  Social History Narrative  . Not on file    Outpatient Medications Prior to Visit  Medication Sig Dispense Refill  . allopurinol (ZYLOPRIM) 100 MG tablet Take 1 tablet (100 mg total) by mouth daily. 90 tablet 1  . amLODipine (NORVASC) 10 MG tablet Take 1 tablet (10 mg total) by mouth daily. 90 tablet 1  . aspirin EC 81 MG tablet Take 81 mg by mouth daily.    Marland Kitchen atorvastatin (LIPITOR)  80 MG tablet Take 1 tablet (80 mg total) by mouth daily. 90 tablet 1  . colchicine 0.6 MG tablet Take 2 pills initially with some food, then 1 twice daily. When doing better can taper to once daily for a few days and then discontinue. 30 tablet 1  . glipiZIDE (GLUCOTROL) 10 MG tablet TAKE ONE tablet in the morning with morning meal, 1/2 tablet with evening meal. 135 tablet 1  . losartan-hydrochlorothiazide (HYZAAR) 100-25 MG tablet Take 1 tablet by mouth daily. 90 tablet 1  . metFORMIN (GLUCOPHAGE) 1000 MG tablet Take 1 tablet (1,000 mg total) by mouth 2 (two) times daily with a meal. 180 tablet 1  . Multiple Vitamins-Minerals (ONE DAILY MENS PO)      No facility-administered medications prior to visit.     No Known Allergies  ROS Review of Systems Review of Systems  Constitutional: Negative for activity change, appetite change, chills and fever.  HENT: Negative for congestion, nosebleeds, trouble swallowing and voice change.   Respiratory: Negative for cough,  shortness of breath and wheezing.   Gastrointestinal: Negative for diarrhea, nausea and vomiting.  Genitourinary: Negative for difficulty urinating, dysuria, flank pain and hematuria.  Musculoskeletal: Negative for back pain, joint swelling and neck pain.  Neurological: Negative for dizziness, speech difficulty, light-headedness and numbness.  See HPI. All other review of systems negative.     Objective:    Physical Exam  BP 138/80   Pulse (!) 105   Temp (!) 100.5 F (38.1 C) (Oral)   Resp 18   Ht 5' 5.25" (1.657 m)   Wt 202 lb (91.6 kg)   SpO2 94%   BMI 33.36 kg/m  Wt Readings from Last 3 Encounters:  12/16/18 202 lb (91.6 kg)  10/13/18 197 lb 12.8 oz (89.7 kg)  07/12/18 193 lb 12.8 oz (87.9 kg)   General: alert, oriented, in NAD Head: normocephalic, atraumatic, no sinus tenderness Eyes: EOM intact, no scleral icterus or conjunctival injection Ears: TM clear bilaterally Nose: mucosa nonerythematous, nonedematous Throat: no pharyngeal exudate or erythema Lymph: no posterior auricular, submental or cervical lymph adenopathy Heart: normal rate, normal sinus rhythm, no murmurs Lungs: clear to auscultation bilaterally, no wheezing    Health Maintenance Due  Topic Date Due  . OPHTHALMOLOGY EXAM  10/28/2018    There are no preventive care reminders to display for this patient.  No results found for: TSH Lab Results  Component Value Date   WBC 8.7 02/01/2017   HGB 14.0 (A) 02/01/2017   HCT 39.7 (A) 02/01/2017   MCV 92.9 02/01/2017   Lab Results  Component Value Date   NA 140 10/13/2018   K 4.6 10/13/2018   CO2 21 10/13/2018   GLUCOSE 132 (H) 10/13/2018   BUN 19 10/13/2018   CREATININE 1.14 10/13/2018   BILITOT 0.3 10/13/2018   ALKPHOS 64 10/13/2018   AST 17 10/13/2018   ALT 19 10/13/2018   PROT 6.9 10/13/2018   ALBUMIN 4.6 10/13/2018   CALCIUM 9.5 10/13/2018   Lab Results  Component Value Date   CHOL 141 10/13/2018   Lab Results  Component Value  Date   HDL 35 (L) 10/13/2018   Lab Results  Component Value Date   LDLCALC 89 10/13/2018   Lab Results  Component Value Date   TRIG 87 10/13/2018   Lab Results  Component Value Date   CHOLHDL 4.0 10/13/2018   Lab Results  Component Value Date   HGBA1C 7.0 (A) 10/13/2018  Assessment & Plan:   Problem List Items Addressed This Visit    None    Visit Diagnoses    Community acquired pneumonia of left lower lobe of lung ([place])    -  Primary   Relevant Medications   azithromycin (ZITHROMAX) 250 MG tablet   guaiFENesin (MUCINEX) 600 MG 12 hr tablet   Fever and chills       Relevant Orders   DG Chest 2 View (Completed)   POCT Influenza A/B (Completed)   Productive cough       Relevant Medications   azithromycin (ZITHROMAX) 250 MG tablet   guaiFENesin (MUCINEX) 600 MG 12 hr tablet   Other Relevant Orders   DG Chest 2 View (Completed)     Will treat for CAP given the atelectasis, thickening, tachycardia and low grade temp Will use zpak and mucinex Advised rest and hydration    Meds ordered this encounter  Medications  . azithromycin (ZITHROMAX) 250 MG tablet    Sig: Take 2 tablets on day 1, then one tablet each day after    Dispense:  6 tablet    Refill:  0  . guaiFENesin (MUCINEX) 600 MG 12 hr tablet    Sig: Take 1 tablet (600 mg total) by mouth 2 (two) times daily.    Dispense:  30 tablet    Refill:  0    Follow-up: Return if symptoms worsen or fail to improve.    Forrest Moron, MD

## 2019-01-04 ENCOUNTER — Ambulatory Visit (INDEPENDENT_AMBULATORY_CARE_PROVIDER_SITE_OTHER): Payer: Medicare HMO | Admitting: Family Medicine

## 2019-01-04 ENCOUNTER — Other Ambulatory Visit: Payer: Self-pay

## 2019-01-04 ENCOUNTER — Other Ambulatory Visit: Payer: Self-pay | Admitting: Emergency Medicine

## 2019-01-04 VITALS — BP 127/71 | Ht 65.25 in | Wt 197.0 lb

## 2019-01-04 DIAGNOSIS — Z0000 Encounter for general adult medical examination without abnormal findings: Secondary | ICD-10-CM

## 2019-01-04 DIAGNOSIS — E1065 Type 1 diabetes mellitus with hyperglycemia: Secondary | ICD-10-CM

## 2019-01-04 DIAGNOSIS — E119 Type 2 diabetes mellitus without complications: Secondary | ICD-10-CM

## 2019-01-04 DIAGNOSIS — IMO0002 Reserved for concepts with insufficient information to code with codable children: Secondary | ICD-10-CM

## 2019-01-04 DIAGNOSIS — E108 Type 1 diabetes mellitus with unspecified complications: Secondary | ICD-10-CM

## 2019-01-04 NOTE — Patient Instructions (Signed)
Thank you for taking time to come for your Medicare Wellness Visit. I appreciate your ongoing commitment to your health goals. Please review the following plan we discussed and let me know if I can assist you in the future.  Derrick Kennedy LPN Advance Directive  Advance directives are legal documents that let you make choices ahead of time about your health care and medical treatment in case you become unable to communicate for yourself. Advance directives are a way for you to communicate your wishes to family, friends, and health care providers. This can help convey your decisions about end-of-life care if you become unable to communicate. Discussing and writing advance directives should happen over time rather than all at once. Advance directives can be changed depending on your situation and what you want, even after you have signed the advance directives. If you do not have an advance directive, some states assign family decision makers to act on your behalf based on how closely you are related to them. Each state has its own laws regarding advance directives. You may want to check with your health care provider, attorney, or state representative about the laws in your state. There are different types of advance directives, such as:  Medical power of attorney.  Living will.  Do not resuscitate (DNR) or do not attempt resuscitation (DNAR) order. Health care proxy and medical power of attorney A health care proxy, also called a health care agent, is a person who is appointed to make medical decisions for you in cases in which you are unable to make the decisions yourself. Generally, people choose someone they know well and trust to represent their preferences. Make sure to ask this person for an agreement to act as your proxy. A proxy may have to exercise judgment in the event of a medical decision for which your wishes are not known. A medical power of attorney is a legal document that names your  health care proxy. Depending on the laws in your state, after the document is written, it may also need to be:  Signed.  Notarized.  Dated.  Copied.  Witnessed.  Incorporated into your medical record. You may also want to appoint someone to manage your financial affairs in a situation in which you are unable to do so. This is called a durable power of attorney for finances. It is a separate legal document from the durable power of attorney for health care. You may choose the same person or someone different from your health care proxy to act as your agent in financial matters. If you do not appoint a proxy, or if there is a concern that the proxy is not acting in your best interests, a court-appointed guardian may be designated to act on your behalf. Living will A living will is a set of instructions documenting your wishes about medical care when you cannot express them yourself. Health care providers should keep a copy of your living will in your medical record. You may want to give a copy to family members or friends. To alert caregivers in case of an emergency, you can place a card in your wallet to let them know that you have a living will and where they can find it. A living will is used if you become:  Terminally ill.  Incapacitated.  Unable to communicate or make decisions. Items to consider in your living will include:  The use or non-use of life-sustaining equipment, such as dialysis machines and breathing machines (ventilators).  A DNR or DNAR order, which is the instruction not to use cardiopulmonary resuscitation (CPR) if breathing or heartbeat stops.  The use or non-use of tube feeding.  Withholding of food and fluids.  Comfort (palliative) care when the goal becomes comfort rather than a cure.  Organ and tissue donation. A living will does not give instructions for distributing your money and property if you should pass away. It is recommended that you seek the  advice of a lawyer when writing a will. Decisions about taxes, beneficiaries, and asset distribution will be legally binding. This process can relieve your family and friends of any concerns surrounding disputes or questions that may come up about the distribution of your assets. DNR or DNAR A DNR or DNAR order is a request not to have CPR in the event that your heart stops beating or you stop breathing. If a DNR or DNAR order has not been made and shared, a health care provider will try to help any patient whose heart has stopped or who has stopped breathing. If you plan to have surgery, talk with your health care provider about how your DNR or DNAR order will be followed if problems occur. Summary  Advance directives are the legal documents that allow you to make choices ahead of time about your health care and medical treatment in case you become unable to communicate for yourself.  The process of discussing and writing advance directives should happen over time. You can change the advance directives, even after you have signed them.  Advance directives include DNR or DNAR orders, living wills, and designating an agent as your medical power of attorney. This information is not intended to replace advice given to you by your health care provider. Make sure you discuss any questions you have with your health care provider. Document Released: 12/29/2007 Document Revised: 08/10/2016 Document Reviewed: 08/10/2016 Elsevier Interactive Patient Education  2019 [employer].

## 2019-01-04 NOTE — Progress Notes (Signed)
Presents today for [facility] Wellness Visit   Date of last exam: 10/13/2018    Other Screening: Last screening for diabetes: 10/13/2018  Immunization status:  Immunization History  Administered Date(s) Administered  . Influenza Split 08/23/2012  . Influenza, High Dose Seasonal PF 07/12/2018  . Influenza,inj,Quad PF,6+ Mos 09/06/2017  . Pneumococcal Conjugate-13 02/02/2017  . Pneumococcal Polysaccharide-23 03/14/2018     There are no preventive care reminders to display for this patient.   Functional Status Survey: Is the patient deaf or have difficulty hearing?: No Does the patient have difficulty seeing, even when wearing glasses/contacts?: No Does the patient have difficulty concentrating, remembering, or making decisions?: No Does the patient have difficulty walking or climbing stairs?: No Does the patient have difficulty dressing or bathing?: No Does the patient have difficulty doing errands alone such as visiting a doctor's office or shopping?: No   6CIT Screen 01/04/2019  What Year? 0 points  What month? 0 points  What time? 0 points  Count back from 20 0 points  Months in reverse 0 points  Repeat phrase 0 points  Total Score 0        Clinical Support from 01/04/2019 in [facility]  AUDIT-C Score  2         Patient Active Problem List   Diagnosis Date Noted  . Hyperlipidemia 10/12/2013  . Diabetes ([place]) 08/23/2012  . HTN (hypertension) 08/23/2012     Past Medical History:  Diagnosis Date  . Diabetes mellitus without complication ([place])   . Hyperlipemia   . Hypertension      Past Surgical History:  Procedure Laterality Date  . FRACTURE SURGERY     R forearm     Family History  Problem Relation Age of Onset  . Lung cancer Father   . Kidney cancer Brother      Social History   Socioeconomic History  . Marital status: Divorced    Spouse name: Not on file  . Number of children: 2  . Years of education: College  .  Highest education level: Not on file  Occupational History  . Not on file  Social Needs  . Financial resource strain: Not on file  . Food insecurity:    Worry: Not on file    Inability: Not on file  . Transportation needs:    Medical: Not on file    Non-medical: Not on file  Tobacco Use  . Smoking status: Never Smoker  . Smokeless tobacco: Never Used  Substance and Sexual Activity  . Alcohol use: Yes    Alcohol/week: 0.0 standard drinks  . Drug use: No  . Sexual activity: Never    Birth control/protection: Abstinence  Lifestyle  . Physical activity:    Days per week: Not on file    Minutes per session: Not on file  . Stress: Not on file  Relationships  . Social connections:    Talks on phone: Not on file    Gets together: Not on file    Attends religious service: Not on file    Active member of club or organization: Not on file    Attends meetings of clubs or organizations: Not on file    Relationship status: Not on file  . Intimate partner violence:    Fear of current or ex partner: Not on file    Emotionally abused: Not on file    Physically abused: Not on file    Forced sexual activity: Not on  file  Other Topics Concern  . Not on file  Social History Narrative  . Not on file     No Known Allergies   Prior to Admission medications   Medication Sig Start Date End Date Taking? Authorizing Provider  allopurinol (ZYLOPRIM) 100 MG tablet Take 1 tablet (100 mg total) by mouth daily. 10/19/18  Yes [name], MD  amLODipine (NORVASC) 10 MG tablet Take 1 tablet (10 mg total) by mouth daily. 10/19/18  Yes [name], MD  aspirin EC 81 MG tablet Take 81 mg by mouth daily.   Yes [provider]  atorvastatin (LIPITOR) 80 MG tablet Take 1 tablet (80 mg total) by mouth daily. 10/19/18  Yes [name], MD  glipiZIDE (GLUCOTROL) 10 MG tablet TAKE ONE tablet in the morning with morning meal, 1/2 tablet with evening meal. 10/19/18  Yes [name], MD  losartan-hydrochlorothiazide (HYZAAR) 100-25 MG tablet Take 1 tablet by mouth daily. 10/19/18  Yes [name], MD  metFORMIN (GLUCOPHAGE) 1000 MG tablet Take 1 tablet (1,000 mg total) by mouth 2 (two) times daily with a meal. 10/19/18  Yes [name], MD  Multiple Vitamins-Minerals (ONE DAILY MENS PO)  03/02/18  Yes [provider]  azithromycin (ZITHROMAX) 250 MG tablet Take 2 tablets on day 1, then one tablet each day after 12/16/18   Forrest Moron, MD  colchicine 0.6 MG tablet Take 2 pills initially with some food, then 1 twice daily. When doing better can taper to once daily for a few days and then discontinue. Patient not taking: Reported on 01/04/2019 12/07/17   [name], MD  guaiFENesin (MUCINEX) 600 MG 12 hr tablet Take 1 tablet (600 mg total) by mouth 2 (two) times daily. 12/16/18   Forrest Moron, MD     Depression screen [facility] 2/9 01/04/2019 12/16/2018 10/13/2018 07/12/2018 03/14/2018  Decreased Interest 0 0 0 0 0  Down, Depressed, Hopeless 0 0 0 0 0  PHQ - 2 Score 0 0 0 0 0     Fall Risk  01/04/2019 12/16/2018 10/13/2018 07/12/2018 04/12/2018  Falls in the past year? 0 0 0 No No  Number falls in past yr: 0 - - - -  Injury with Fall? 0 - - - -  Follow up - Falls evaluation completed - - -      PHYSICAL EXAM: BP 127/71   Ht 5' 5.25" (1.657 m)   Wt 197 lb (89.4 kg)   BMI 32.53 kg/m    Wt Readings from Last 3 Encounters:  01/04/19 197 lb (89.4 kg)  12/16/18 202 lb (91.6 kg)  10/13/18 197 lb 12.8 oz (89.7 kg)     No exam data present    Physical Exam   Education/Counseling provided regarding diet and exercise, prevention of chronic diseases, smoking/tobacco cessation, if applicable, and reviewed "Covered Medicare Preventive Services."   ASSESSMENT/PLAN: There are no diagnoses linked to this encounter.    Patient lives in story home lives by himself . Has master bedroom downstairs  Patient states no trouble climbing stairs Patient  states able to rise from chair no problems  Checks sugars daily  Advanced directive discussed will mail information  House is free of scattered rugs No grab bars Well lit  Shingrix discussed

## 2019-01-16 ENCOUNTER — Ambulatory Visit: Payer: Medicare HMO | Admitting: Family Medicine

## 2019-03-08 DIAGNOSIS — G4733 Obstructive sleep apnea (adult) (pediatric): Secondary | ICD-10-CM | POA: Diagnosis not present

## 2019-03-13 DIAGNOSIS — L720 Epidermal cyst: Secondary | ICD-10-CM | POA: Diagnosis not present

## 2019-03-13 DIAGNOSIS — L723 Sebaceous cyst: Secondary | ICD-10-CM | POA: Insufficient documentation

## 2019-03-28 ENCOUNTER — Other Ambulatory Visit: Payer: Self-pay | Admitting: Family Medicine

## 2019-03-28 DIAGNOSIS — I10 Essential (primary) hypertension: Secondary | ICD-10-CM

## 2019-03-28 DIAGNOSIS — E785 Hyperlipidemia, unspecified: Secondary | ICD-10-CM

## 2019-03-28 DIAGNOSIS — E119 Type 2 diabetes mellitus without complications: Secondary | ICD-10-CM

## 2019-04-03 ENCOUNTER — Other Ambulatory Visit: Payer: Self-pay | Admitting: Family Medicine

## 2019-04-03 DIAGNOSIS — E119 Type 2 diabetes mellitus without complications: Secondary | ICD-10-CM

## 2019-04-03 DIAGNOSIS — M1A0790 Idiopathic chronic gout, unspecified ankle and foot, without tophus (tophi): Secondary | ICD-10-CM

## 2019-04-17 ENCOUNTER — Ambulatory Visit: Payer: Medicare HMO | Admitting: Adult Health

## 2019-04-18 ENCOUNTER — Encounter: Payer: Self-pay | Admitting: Adult Health

## 2019-04-19 ENCOUNTER — Encounter: Payer: Self-pay | Admitting: Family Medicine

## 2019-04-19 ENCOUNTER — Ambulatory Visit (INDEPENDENT_AMBULATORY_CARE_PROVIDER_SITE_OTHER): Payer: Medicare HMO | Admitting: Family Medicine

## 2019-04-19 ENCOUNTER — Other Ambulatory Visit: Payer: Self-pay

## 2019-04-19 VITALS — BP 137/71 | HR 88 | Temp 98.6°F | Resp 14 | Wt 201.8 lb

## 2019-04-19 DIAGNOSIS — M1A0790 Idiopathic chronic gout, unspecified ankle and foot, without tophus (tophi): Secondary | ICD-10-CM | POA: Diagnosis not present

## 2019-04-19 DIAGNOSIS — I10 Essential (primary) hypertension: Secondary | ICD-10-CM

## 2019-04-19 DIAGNOSIS — M109 Gout, unspecified: Secondary | ICD-10-CM

## 2019-04-19 DIAGNOSIS — E119 Type 2 diabetes mellitus without complications: Secondary | ICD-10-CM

## 2019-04-19 DIAGNOSIS — E785 Hyperlipidemia, unspecified: Secondary | ICD-10-CM | POA: Diagnosis not present

## 2019-04-19 MED ORDER — GLIPIZIDE 10 MG PO TABS: ORAL_TABLET | ORAL | 1 refills | Status: DC

## 2019-04-19 MED ORDER — METFORMIN HCL 1000 MG PO TABS: 1000.0000 mg | ORAL_TABLET | Freq: Two times a day (BID) | ORAL | 1 refills | Status: DC

## 2019-04-19 MED ORDER — ALLOPURINOL 100 MG PO TABS: 100.0000 mg | ORAL_TABLET | Freq: Every day | ORAL | 1 refills | Status: DC

## 2019-04-19 MED ORDER — LOSARTAN POTASSIUM-HCTZ 100-25 MG PO TABS: 1.0000 | ORAL_TABLET | Freq: Every day | ORAL | 1 refills | Status: DC

## 2019-04-19 MED ORDER — ATORVASTATIN CALCIUM 80 MG PO TABS: 80.0000 mg | ORAL_TABLET | Freq: Every day | ORAL | 1 refills | Status: DC

## 2019-04-19 MED ORDER — AMLODIPINE BESYLATE 10 MG PO TABS: 10.0000 mg | ORAL_TABLET | Freq: Every day | ORAL | 1 refills | Status: DC

## 2019-04-19 NOTE — Patient Instructions (Addendum)
No change in meds for now. Okay to continue full dose of glipizide twice per day if blood sugars remain elevated, but I suspect increased exercise will help readings.  Follow-up in 3 months for repeat testing.  Thank you for coming in today. Stay safe.    If you have lab work done today you will be contacted with your lab results within the next 2 weeks.  If you have not heard from [place] then please contact us. The fastest way to get your results is to register for My Chart.   IF you received an x-ray today, you will receive an invoice from [facility]. Please contact [facility] at [phone] with questions or concerns regarding your invoice.   IF you received labwork today, you will receive an invoice from [facility]. Please contact [facility] at [phone] with questions or concerns regarding your invoice.   Our billing staff will not be able to assist you with questions regarding bills from these companies.  You will be contacted with the lab results as soon as they are available. The fastest way to get your results is to activate your My Chart account. Instructions are located on the last page of this paperwork. If you have not heard from [place] regarding the results in 2 weeks, please contact this office.

## 2019-04-19 NOTE — Progress Notes (Signed)
Subjective:    Patient ID: Derrick Becker, male    DOB: 28-Jul-1946, 73 y.o.   MRN: [id number]  HPI Derrick Becker is a 73 y.o. male Presents today for: Chief Complaint  Patient presents with  . Diabetes    3 month f/u, Patient states he is doing well with no complaints. Blood sugar this am ws 128   Diabetes:  Glipizide 10 mg in morning, 5 mg at night.  Metformin 1000 mg twice daily.  He is on ARB, he is on statin. Microalbumin: Normal ratio 07/12/2018. Optho, foot exam, pneumovax: Up-to-date Home blood sugars: 128 this morning, usually in 160's.  Has been in [place] since January - [place].  sister diagnosed with multiple myeloma. She is now moving to maintenance program. Less exercise past 6 months with this and with Covid pandemic. Plans to be here in September. Has been taking full glipizide BID for past few weeks.  No symptomatic lows. Plans on increasing exercise.   Lab Results  Component Value Date   HGBA1C 7.0 (A) 10/13/2018   HGBA1C 6.3 (H) 07/12/2018   HGBA1C 6.5 (H) 03/14/2018   Lab Results  Component Value Date   MICROALBUR 18.2 (H) 12/22/2014   LDLCALC 89 10/13/2018   CREATININE 1.14 10/13/2018   Hypertension: BP Readings from Last 3 Encounters:  04/19/19 137/71  01/04/19 127/71  12/16/18 138/80   Lab Results  Component Value Date   CREATININE 1.14 10/13/2018   Losartan HCT 100/25 mg daily, amlodipine 10 mg daily.  No new med side effects.  Home readings: 122/65. 115 at times.   Gout: Allopurinol 100 mg daily. Lab Results  Component Value Date   LABURIC 5.2 12/07/2017  last flare over 6 months ago. Tolerating allopurinol.   Hyperlipidemia:  Lab Results  Component Value Date   CHOL 141 10/13/2018   HDL 35 (L) 10/13/2018   LDLCALC 89 10/13/2018   TRIG 87 10/13/2018   CHOLHDL 4.0 10/13/2018   Lab Results  Component Value Date   ALT 19 10/13/2018   AST 17 10/13/2018   ALKPHOS 64 10/13/2018   BILITOT 0.3 10/13/2018  Lipitor 80 mg daily.  No new myalgias or med side effects.    Patient Active Problem List   Diagnosis Date Noted  . Hyperlipidemia 10/12/2013  . Diabetes ([place]) 08/23/2012  . HTN (hypertension) 08/23/2012   Past Medical History:  Diagnosis Date  . Diabetes mellitus without complication ([place])   . Hyperlipemia   . Hypertension    Past Surgical History:  Procedure Laterality Date  . FRACTURE SURGERY     R forearm   No Known Allergies Prior to Admission medications   Medication Sig Start Date End Date Taking? Authorizing Provider  allopurinol (ZYLOPRIM) 100 MG tablet TAKE 1 TABLET (100 MG TOTAL) BY MOUTH DAILY. 04/03/19  Yes [name], MD  amLODipine (NORVASC) 10 MG tablet TAKE 1 TABLET (10 MG TOTAL) BY MOUTH DAILY. 03/29/19  Yes [name], MD  aspirin EC 81 MG tablet Take 81 mg by mouth daily.   Yes [provider]  atorvastatin (LIPITOR) 80 MG tablet TAKE 1 TABLET (80 MG TOTAL) BY MOUTH DAILY. 03/29/19  Yes [name], MD  glipiZIDE (GLUCOTROL) 10 MG tablet TAKE ONE TABLET IN THE MORNING WITH MORNING MEAL AND 1/2 TABLET WITH EVENING MEAL. 04/03/19  Yes [name], MD  losartan-hydrochlorothiazide [facility]) 100-25 MG tablet TAKE 1 TABLET EVERY DAY 03/29/19  Yes [name], MD  metFORMIN (GLUCOPHAGE) 1000  MG tablet TAKE 1 TABLET TWICE DAILY WITH MEALS 03/29/19  Yes [name], MD  Multiple Vitamins-Minerals (ONE DAILY MENS PO)  03/02/18  Yes [provider]   Social History   Socioeconomic History  . Marital status: Divorced    Spouse name: Not on file  . Number of children: 2  . Years of education: College  . Highest education level: Not on file  Occupational History  . Not on file  Social Needs  . Financial resource strain: Not on file  . Food insecurity    Worry: Not on file    Inability: Not on file  . Transportation needs    Medical: Not on file    Non-medical: Not on file  Tobacco Use  . Smoking status: Never Smoker  . Smokeless  tobacco: Never Used  Substance and Sexual Activity  . Alcohol use: Yes    Alcohol/week: 0.0 standard drinks  . Drug use: No  . Sexual activity: Never    Birth control/protection: Abstinence  Lifestyle  . Physical activity    Days per week: Not on file    Minutes per session: Not on file  . Stress: Not on file  Relationships  . Social Herbalist on phone: Not on file    Gets together: Not on file    Attends religious service: Not on file    Active member of club or organization: Not on file    Attends meetings of clubs or organizations: Not on file    Relationship status: Not on file  . Intimate partner violence    Fear of current or ex partner: Not on file    Emotionally abused: Not on file    Physically abused: Not on file    Forced sexual activity: Not on file  Other Topics Concern  . Not on file  Social History Narrative  . Not on file    Review of Systems  Constitutional: Negative for fatigue and unexpected weight change.  Eyes: Negative for visual disturbance.  Respiratory: Negative for cough, chest tightness and shortness of breath.   Cardiovascular: Negative for chest pain, palpitations and leg swelling.  Gastrointestinal: Negative for abdominal pain and blood in stool.  Neurological: Negative for dizziness, light-headedness and headaches.       Objective:   Physical Exam Vitals signs reviewed.  Constitutional:      Appearance: He is well-developed.  HENT:     Head: Normocephalic and atraumatic.  Eyes:     Pupils: Pupils are equal, round, and reactive to light.  Neck:     Vascular: No carotid bruit or JVD.  Cardiovascular:     Rate and Rhythm: Normal rate and regular rhythm.     Heart sounds: Normal heart sounds. No murmur.  Pulmonary:     Effort: Pulmonary effort is normal.     Breath sounds: Normal breath sounds. No rales.  Skin:    General: Skin is warm and dry.  Neurological:     Mental Status: He is alert and oriented to person,  place, and time.    Vitals:   04/19/19 0927  BP: 137/71  Pulse: 88  Resp: 14  Temp: 98.6 F (37 C)  SpO2: 97%         Assessment & Plan:   Derrick Becker is a 73 y.o. male Type 2 diabetes mellitus without complication, without long-term current use of insulin (HCC) - Plan: Hemoglobin A1c, glipiZIDE (GLUCOTROL) 10 MG tablet, metFORMIN (GLUCOPHAGE)  1000 MG tablet  -Okay to continue glipizide full tablet twice per day for now until exercise increases, watch for low blood sugars.  If borderline A1c, may watch for improvement with just diet changes/exercise as that has been decreased recently.  No change in metformin for now.  Recheck levels 3 months  Hyperlipidemia, unspecified hyperlipidemia type - Plan: Lipid Panel, atorvastatin (LIPITOR) 80 MG tablet  -  Stable, tolerating current regimen. Medications refilled. Labs pending as above.   Essential hypertension - Plan: Comprehensive metabolic panel, losartan-hydrochlorothiazide (HYZAAR) 100-25 MG tablet, amLODipine (NORVASC) 10 MG tablet  -  Stable, tolerating current regimen. Medications refilled. Labs pending as above.   Gout, unspecified cause, unspecified chronicity, unspecified site - Plan: Uric Acid Chronic gout of ankle, unspecified cause, unspecified laterality - Plan: allopurinol (ZYLOPRIM) 100 MG tablet  -Denies recent flares, check updated uric acid, continue allopurinol same dose for now.  Meds ordered this encounter  Medications  . glipiZIDE (GLUCOTROL) 10 MG tablet    Sig: TAKE ONE TABLET IN THE MORNING WITH MORNING MEAL AND 1/2 to 1 TABLET WITH EVENING MEAL.    Dispense:  180 tablet    Refill:  1  . losartan-hydrochlorothiazide (HYZAAR) 100-25 MG tablet    Sig: Take 1 tablet by mouth daily.    Dispense:  90 tablet    Refill:  1  . metFORMIN (GLUCOPHAGE) 1000 MG tablet    Sig: Take 1 tablet (1,000 mg total) by mouth 2 (two) times daily with a meal.    Dispense:  180 tablet    Refill:  1  . amLODipine (NORVASC)  10 MG tablet    Sig: Take 1 tablet (10 mg total) by mouth daily.    Dispense:  90 tablet    Refill:  1  . allopurinol (ZYLOPRIM) 100 MG tablet    Sig: Take 1 tablet (100 mg total) by mouth daily.    Dispense:  90 tablet    Refill:  1  . atorvastatin (LIPITOR) 80 MG tablet    Sig: Take 1 tablet (80 mg total) by mouth daily.    Dispense:  90 tablet    Refill:  1   Patient Instructions   No change in meds for now. Okay to continue full dose of glipizide twice per day if blood sugars remain elevated, but I suspect increased exercise will help readings.  Follow-up in 3 months for repeat testing.  Thank you for coming in today. Stay safe.    If you have lab work done today you will be contacted with your lab results within the next 2 weeks.  If you have not heard from [place] then please contact us. The fastest way to get your results is to register for My Chart.   IF you received an x-ray today, you will receive an invoice from [facility]. Please contact [facility] at [phone] with questions or concerns regarding your invoice.   IF you received labwork today, you will receive an invoice from [place]. Please contact [facility] at [phone] with questions or concerns regarding your invoice.   Our billing staff will not be able to assist you with questions regarding bills from these companies.  You will be contacted with the lab results as soon as they are available. The fastest way to get your results is to activate your My Chart account. Instructions are located on the last page of this paperwork. If you have not heard from [place] regarding the results in 2 weeks, please contact this  office.       Signed,   Merri Ray, MD [facility].  04/19/19 10:28 AM

## 2019-04-20 LAB — LIPID PANEL
Chol/HDL Ratio: 3.2 ratio (ref 0.0–5.0)
Cholesterol, Total: 151 mg/dL (ref 100–199)
HDL: 47 mg/dL (ref >39)
LDL Calculated: 80 mg/dL (ref 0–99)
Triglycerides: 119 mg/dL (ref 0–149)
VLDL Cholesterol Cal: 24 mg/dL (ref 5–40)

## 2019-04-20 LAB — COMPREHENSIVE METABOLIC PANEL WITH GFR
ALT: 24 IU/L (ref 0–44)
AST: 25 IU/L (ref 0–40)
Albumin/Globulin Ratio: 2.1 (ref 1.2–2.2)
Albumin: 4.8 g/dL — ABNORMAL HIGH (ref 3.7–4.7)
Alkaline Phosphatase: 67 IU/L (ref 39–117)
BUN/Creatinine Ratio: 21 (ref 10–24)
BUN: 22 mg/dL (ref 8–27)
Bilirubin Total: 0.5 mg/dL (ref 0.0–1.2)
CO2: 20 mmol/L (ref 20–29)
Calcium: 9.1 mg/dL (ref 8.6–10.2)
Chloride: 100 mmol/L (ref 96–106)
Creatinine, Ser: 1.03 mg/dL (ref 0.76–1.27)
GFR calc Af Amer: 83 mL/min/1.73
GFR calc non Af Amer: 72 mL/min/1.73
Globulin, Total: 2.3 g/dL (ref 1.5–4.5)
Glucose: 166 mg/dL — ABNORMAL HIGH (ref 65–99)
Potassium: 4.7 mmol/L (ref 3.5–5.2)
Sodium: 137 mmol/L (ref 134–144)
Total Protein: 7.1 g/dL (ref 6.0–8.5)

## 2019-04-20 LAB — URIC ACID: Uric Acid: 5.7 mg/dL (ref 3.7–8.6)

## 2019-04-20 LAB — HEMOGLOBIN A1C
Est. average glucose Bld gHb Est-mCnc: 171 mg/dL
Hgb A1c MFr Bld: 7.6 % — ABNORMAL HIGH (ref 4.8–5.6)

## 2019-07-21 ENCOUNTER — Ambulatory Visit: Payer: Medicare HMO | Admitting: Family Medicine

## 2019-07-25 ENCOUNTER — Ambulatory Visit: Payer: Medicare HMO | Admitting: Adult Health

## 2019-08-15 DIAGNOSIS — Z961 Presence of intraocular lens: Secondary | ICD-10-CM | POA: Diagnosis not present

## 2019-08-15 DIAGNOSIS — E119 Type 2 diabetes mellitus without complications: Secondary | ICD-10-CM | POA: Diagnosis not present

## 2019-08-15 DIAGNOSIS — H40013 Open angle with borderline findings, low risk, bilateral: Secondary | ICD-10-CM | POA: Diagnosis not present

## 2019-08-15 DIAGNOSIS — H25812 Combined forms of age-related cataract, left eye: Secondary | ICD-10-CM | POA: Diagnosis not present

## 2019-08-15 LAB — HM DIABETES EYE EXAM

## 2019-08-22 ENCOUNTER — Encounter: Payer: Self-pay | Admitting: Family Medicine

## 2019-08-28 ENCOUNTER — Encounter: Payer: Self-pay | Admitting: Family Medicine

## 2019-08-28 ENCOUNTER — Other Ambulatory Visit: Payer: Self-pay

## 2019-08-28 ENCOUNTER — Ambulatory Visit (INDEPENDENT_AMBULATORY_CARE_PROVIDER_SITE_OTHER): Payer: Medicare HMO | Admitting: Family Medicine

## 2019-08-28 VITALS — BP 114/65 | HR 77 | Temp 98.5°F | Wt 208.0 lb

## 2019-08-28 DIAGNOSIS — I10 Essential (primary) hypertension: Secondary | ICD-10-CM | POA: Diagnosis not present

## 2019-08-28 DIAGNOSIS — E1165 Type 2 diabetes mellitus with hyperglycemia: Secondary | ICD-10-CM | POA: Diagnosis not present

## 2019-08-28 DIAGNOSIS — E119 Type 2 diabetes mellitus without complications: Secondary | ICD-10-CM

## 2019-08-28 DIAGNOSIS — E785 Hyperlipidemia, unspecified: Secondary | ICD-10-CM

## 2019-08-28 DIAGNOSIS — M1A0790 Idiopathic chronic gout, unspecified ankle and foot, without tophus (tophi): Secondary | ICD-10-CM

## 2019-08-28 LAB — HEMOGLOBIN A1C
Est. average glucose Bld gHb Est-mCnc: 169 mg/dL
Hgb A1c MFr Bld: 7.5 % — ABNORMAL HIGH (ref 4.8–5.6)

## 2019-08-28 MED ORDER — ATORVASTATIN CALCIUM 80 MG PO TABS: 80.0000 mg | ORAL_TABLET | Freq: Every day | ORAL | 1 refills | Status: DC

## 2019-08-28 MED ORDER — ALLOPURINOL 100 MG PO TABS: 100.0000 mg | ORAL_TABLET | Freq: Every day | ORAL | 1 refills | Status: DC

## 2019-08-28 MED ORDER — LOSARTAN POTASSIUM-HCTZ 100-25 MG PO TABS: 1.0000 | ORAL_TABLET | Freq: Every day | ORAL | 1 refills | Status: DC

## 2019-08-28 MED ORDER — METFORMIN HCL 1000 MG PO TABS: 1000.0000 mg | ORAL_TABLET | Freq: Two times a day (BID) | ORAL | 1 refills | Status: DC

## 2019-08-28 MED ORDER — AMLODIPINE BESYLATE 10 MG PO TABS: 10.0000 mg | ORAL_TABLET | Freq: Every day | ORAL | 1 refills | Status: DC

## 2019-08-28 NOTE — Progress Notes (Signed)
Subjective:  Patient ID: Derrick Becker, male    DOB: 10-22-1945  Age: 73 y.o. MRN: [id number]  CC:  Chief Complaint  Patient presents with  . Diabetes     3 month f/u with no other issues    HPI Derrick Becker presents for  Diabetes: Associated with hyperglycemia based on A1c 7.6 in July.  Treated with metformin 1000 mg twice daily, glipizide 10 mg in the morning, 10 mg in the evening. He is on statin as well as ARB. Less diet adherence when in [place] with sister - being treated for multiple myeloma, helping with cooking, driving her, etc.  Plan for increased exercise last visit - not much has changed. Hard to walk where he is staying.  No symptomatic lows.  Home reading - 131 this am, up to 155, 170's. No 200's.   Wt Readings from Last 3 Encounters:  08/28/19 208 lb (94.3 kg)  04/19/19 201 lb 12.8 oz (91.5 kg)  01/04/19 197 lb (89.4 kg)   Microalbumin: nl ratio in 07/2018.  Optho, foot exam, pneumovax: up to date.  Diabetic Foot Exam - Simple   Simple Foot Form Diabetic Foot exam was performed with the following findings: Yes 08/28/2019  8:44 AM  Visual Inspection Sensation Testing Pulse Check Comments      Lab Results  Component Value Date   HGBA1C 7.6 (H) 04/19/2019   HGBA1C 7.0 (A) 10/13/2018   HGBA1C 6.3 (H) 07/12/2018   Lab Results  Component Value Date   MICROALBUR 18.2 (H) 12/22/2014   [place] 80 04/19/2019   CREATININE 1.03 04/19/2019    History Patient Active Problem List   Diagnosis Date Noted  . Hyperlipidemia 10/12/2013  . Diabetes ([place]) 08/23/2012  . HTN (hypertension) 08/23/2012   Past Medical History:  Diagnosis Date  . Diabetes mellitus without complication ([place])   . Hyperlipemia   . Hypertension    Past Surgical History:  Procedure Laterality Date  . FRACTURE SURGERY     R forearm   No Known Allergies Prior to Admission medications   Medication Sig Start Date End Date Taking? Authorizing Provider  allopurinol (ZYLOPRIM) 100 MG  tablet Take 1 tablet (100 mg total) by mouth daily. 04/19/19  Yes [name], MD  amLODipine (NORVASC) 10 MG tablet Take 1 tablet (10 mg total) by mouth daily. 04/19/19  Yes [name], MD  aspirin EC 81 MG tablet Take 81 mg by mouth daily.   Yes [provider]  atorvastatin (LIPITOR) 80 MG tablet Take 1 tablet (80 mg total) by mouth daily. 04/19/19  Yes [name], MD  glipiZIDE (GLUCOTROL) 10 MG tablet TAKE ONE TABLET IN THE MORNING WITH MORNING MEAL AND 1/2 to 1 TABLET WITH EVENING MEAL. 04/19/19  Yes [name], MD  losartan-hydrochlorothiazide (HYZAAR) 100-25 MG tablet Take 1 tablet by mouth daily. 04/19/19  Yes [name], MD  metFORMIN (GLUCOPHAGE) 1000 MG tablet Take 1 tablet (1,000 mg total) by mouth 2 (two) times daily with a meal. 04/19/19  Yes [name], MD  Multiple Vitamins-Minerals (ONE DAILY MENS PO)  03/02/18  Yes [provider]   Social History   Socioeconomic History  . Marital status: Divorced    Spouse name: Not on file  . Number of children: 2  . Years of education: College  . Highest education level: Not on file  Occupational History  . Not on file  Social Needs  . Financial resource strain: Not on  file  . Food insecurity    Worry: Not on file    Inability: Not on file  . Transportation needs    Medical: Not on file    Non-medical: Not on file  Tobacco Use  . Smoking status: Never Smoker  . Smokeless tobacco: Never Used  Substance and Sexual Activity  . Alcohol use: Yes    Alcohol/week: 0.0 standard drinks  . Drug use: No  . Sexual activity: Never    Birth control/protection: Abstinence  Lifestyle  . Physical activity    Days per week: Not on file    Minutes per session: Not on file  . Stress: Not on file  Relationships  . Social Herbalist on phone: Not on file    Gets together: Not on file    Attends religious service: Not on file    Active member of club or organization: Not on  file    Attends meetings of clubs or organizations: Not on file    Relationship status: Not on file  . Intimate partner violence    Fear of current or ex partner: Not on file    Emotionally abused: Not on file    Physically abused: Not on file    Forced sexual activity: Not on file  Other Topics Concern  . Not on file  Social History Narrative  . Not on file    Review of Systems  Constitutional: Negative for fatigue and unexpected weight change.  Eyes: Negative for visual disturbance.  Respiratory: Negative for cough, chest tightness and shortness of breath.   Cardiovascular: Negative for chest pain, palpitations and leg swelling.  Gastrointestinal: Negative for abdominal pain and blood in stool.  Neurological: Negative for dizziness, light-headedness and headaches.     Objective:   Vitals:   08/28/19 0841  BP: 114/65  Pulse: 77  Temp: 98.5 F (36.9 C)  TempSrc: Oral  SpO2: 98%  Weight: 208 lb (94.3 kg)     Physical Exam Vitals signs reviewed.  Constitutional:      Appearance: He is well-developed.  HENT:     Head: Normocephalic and atraumatic.  Eyes:     Pupils: Pupils are equal, round, and reactive to light.  Neck:     Vascular: No carotid bruit or JVD.  Cardiovascular:     Rate and Rhythm: Normal rate and regular rhythm.     Heart sounds: Normal heart sounds. No murmur.  Pulmonary:     Effort: Pulmonary effort is normal.     Breath sounds: Normal breath sounds. No rales.  Skin:    General: Skin is warm and dry.  Neurological:     Mental Status: He is alert and oriented to person, place, and time.        Assessment & Plan:  Derrick Becker is a 73 y.o. male . Type 2 diabetes mellitus without complication, without long-term current use of insulin ([place]) - Plan: HM Diabetes Foot Exam, Hemoglobin A1c  -Weight has increased with decreased activity, diet adherence.  Low intensity exercise such as walking discussed somewhat with portion control,  monitoring diet.  Continue same dose of glipizide, Metformin for now.  Consider Januvia if elevated A1c.  - recheck 3 months.   No orders of the defined types were placed in this encounter.  Patient Instructions    Continue Metformin, glipizide at same doses for now.  Walking or other form of low intensity activity most days per week can be helpful, as well  as watching portion sizes with meals, be careful with late-night meals.  Depending on A1c, we can discuss need for other meds.  Thank you for coming in today and stay safe.     If you have lab work done today you will be contacted with your lab results within the next 2 weeks.  If you have not heard from [place] then please contact us. The fastest way to get your results is to register for My Chart.   IF you received an x-ray today, you will receive an invoice from [facility]. Please contact [facility] at [phone] with questions or concerns regarding your invoice.   IF you received labwork today, you will receive an invoice from [place]. Please contact [facility] at [phone] with questions or concerns regarding your invoice.   Our billing staff will not be able to assist you with questions regarding bills from these companies.  You will be contacted with the lab results as soon as they are available. The fastest way to get your results is to activate your My Chart account. Instructions are located on the last page of this paperwork. If you have not heard from [place] regarding the results in 2 weeks, please contact this office.          Signed, Merri Ray, MD [facility]

## 2019-08-28 NOTE — Patient Instructions (Addendum)
  Continue Metformin, glipizide at same doses for now.  Walking or other form of low intensity activity most days per week can be helpful, as well as watching portion sizes with meals, be careful with late-night meals.  Depending on A1c, we can discuss need for other meds.  Thank you for coming in today and stay safe.     If you have lab work done today you will be contacted with your lab results within the next 2 weeks.  If you have not heard from [place] then please contact us. The fastest way to get your results is to register for My Chart.   IF you received an x-ray today, you will receive an invoice from [facility]. Please contact [facility] at [phone] with questions or concerns regarding your invoice.   IF you received labwork today, you will receive an invoice from [place]. Please contact [facility] at [phone] with questions or concerns regarding your invoice.   Our billing staff will not be able to assist you with questions regarding bills from these companies.  You will be contacted with the lab results as soon as they are available. The fastest way to get your results is to activate your My Chart account. Instructions are located on the last page of this paperwork. If you have not heard from [place] regarding the results in 2 weeks, please contact this office.

## 2019-08-29 LAB — MICROALBUMIN / CREATININE URINE RATIO
Creatinine, Urine: 66.5 mg/dL
Microalb/Creat Ratio: 13 mg/g creat (ref 0–29)
Microalbumin, Urine: 8.6 ug/mL

## 2019-09-07 ENCOUNTER — Encounter: Payer: Self-pay | Admitting: Neurology

## 2019-09-07 ENCOUNTER — Other Ambulatory Visit: Payer: Self-pay

## 2019-09-07 ENCOUNTER — Ambulatory Visit: Payer: Medicare HMO | Admitting: Neurology

## 2019-09-07 VITALS — BP 132/73 | HR 85 | Temp 97.8°F | Ht 65.25 in | Wt 208.2 lb

## 2019-09-07 DIAGNOSIS — Z9989 Dependence on other enabling machines and devices: Secondary | ICD-10-CM | POA: Diagnosis not present

## 2019-09-07 DIAGNOSIS — G4733 Obstructive sleep apnea (adult) (pediatric): Secondary | ICD-10-CM | POA: Diagnosis not present

## 2019-09-07 NOTE — Patient Instructions (Signed)
You are doing well with your [employer] machine.  Please keep up the good work and stay up-to-date with your supplies.  You can follow-up in 1 year.

## 2019-09-07 NOTE — Progress Notes (Signed)
Subjective:    Patient ID: Derrick Becker is a 73 y.o. male.  HPI     Interim history:   Derrick Becker is a 73 year old right-handed gentleman with an underlying medical history of type 2 diabetes, hypertension, hyperlipidemia and obesity, who presents for follow up consultation of his obstructive sleep apnea, on AutoPap therapy. The patient is unaccompanied today. I last saw him on 04/12/2018, at which time he was compliant with his AutoPap.  He did better with AutoPap compared to CPAP.  He was doing well.  He had lost a little bit of weight, his A1c was a little better as well.  He was advised to follow-up routinely in 1 year.   Today, 09/07/2019: I reviewed his AutoPap compliance data from 08/07/2019 through 09/05/2019 which is a total of 30 days, during which time he used his AutoPap 25 days with percent use days greater than 4 hours at 83%, indicating very good compliance with an average usage of 6 hours and 3 minutes, residual AHI at goal at 0.6/h, 95th percentile of pressure at 8.6 cm with a range of 5 cm to 15 cm with EPR.  Leak on the higher acceptable range, with a 95th percentile at 15 L/min.  He reports doing okay, stable with the CPAP, no new issues.  His DME company is [employer].  He is typically up-to-date with supplies.  He has had interim stress regarding his sister's health, he traveled to [place], [place] to be with her and help her out.  She is doing a little better, had bone marrow transplant recently.   The patient's allergies, current medications, family history, past medical history, past social history, past surgical history and problem list were reviewed and updated as appropriate.    Previously (copied from previous notes for reference):    I saw him on 01/25/2017, at which time he was doing well. He had an overnight pulse oximetry test in November 2017 which showed benign results on AutoPap therapy.   I reviewed his AutoPap compliance data from 03/12/2018 through 04/10/2018 which  is a total of 30 days, during which time he used his AutoPap 22 days with percent used days greater than 4 hours at 67%, indicating slightly suboptimal compliance with an average usage of 4 hours and 52 minutes, residual AHI at goal at 0.7 per hour, 95th percentile of pressure at 8.1 cm, leak acceptable with the 95th percentile at 8.7 L/m on a pressure range of 5-15 cm with EPR.   I saw him on 07/27/2016, at which time he reported doing well on AutoPap therapy. He felt better. He had very good compliance with AutoPap. He had not had his also oximetry test yet. I suggested he continue with AutoPap therapy and we order a pulse ox oximetry test overnight.   I reviewed his AutoPap compliance data from 12/21/2016 through 01/19/2017, which is a total of 30 days, during which time he used his CPAP only 19 days with percent used days greater than 4 hours at 57%, indicating suboptimal compliance with an average usage of 4 hours and 32 minutes, residual AHI 0.6 per hour, 95th percentile pressure of 9.4 cm, leak acceptable for the 95th percentile at 14.3 L/m, range of 5-15 cm with EPR. He had an overnight pulse oximetry test on 08/05/2016 which I reviewed: Average oxygen saturation was 95%, nadir was 88%. Time below 88% saturation was 0 minutes.    I saw him on 01/22/2016, at which time he was compliant with AutoPap. Unfortunately,  his overnight pulse oximetry test had not been done. We re-requested that from his DME. He reported doing well, he was adjusting well to treatment, he felt better rested with AutoPap therapy and requested to try a chinstrap because of occasional mouth opening and mouth dryness. Otherwise he was doing rather well. I suggested we proceed with overnight pulse oximetry testing while he is on AutoPap therapy to ensure proper oxygen saturations while on treatment. Unfortunately, this has yet to be done. We called his DME company on 07/23/2016 and they had not done the ONO yet.    I reviewed his  AutoPap compliance data from 06/23/2016 through 07/22/2016 which is a total of 30 days, during which time he used his machine 27 days with percent used days greater than 4 hours at 83%, indicating very good compliance with an average usage for all days of 4 hours and 28 minutes, residual AHI 1 per hour, 95th percentile of pressure at 9.3 cm, and leak acceptable for the 95th percentile at 14.8 L/m, range of 5 cm to 15 cm with EPR.   I first met him on 07/02/2015 at the request of his primary care physician, at which time the patient reported snoring, excessive daytime somnolence, sleep disruption, difficulty going to sleep and maintaining sleep. The patient was invited back for sleep study. His insurance denied an in-house sleep study. He had a home sleep test on 08/15/2015, which showed a total AHI of 42.9 per hour, oxycodone desaturation nadir of 60%, time below 88% saturation was over 4 hours in keeping with significant nocturnal hypoxemia. Is insurance unfortunately also denied an in-house CPAP titration study which I requested. I placed him on AutoPap therapy. I also suggested we check an overnight pulse oximetry while he is on AutoPap therapy after he is established on treatment. This did not get done.   I reviewed his AutoPap compliance data from 12/22/2015 through 01/20/2016 which is a total of 30 days during which time he used his machine 28 days with percent used days greater than 4 hours at 87%, indicating very good compliance with an average usage of fibers and 1 minute, residual AHI 0.9 per hour, 95th percentile pressure at 9.8 cm, leak acceptable with the 95th percentile at 16.6 L/m, pressure range of 5-15 cm with EPR.   07/02/2015: He reports snoring and excessive daytime somnolence with nighttime sleep disruption including difficulty maintaining sleep and difficulty going to sleep. His ESS is 16/24 and his fatigue score is 43/63. He has has at times loud snoring. Symptoms have been ongoing for  months to perhaps use. He does not wake up rested. He denies morning headaches and has nocturia occasionally. He denies frank restless leg symptoms and does not know if he twitches his legs in his sleep. He lives alone. He is divorced. He has 2 grown sons. He works full-time at a Education [employer], [employer], 6 AM to 2:30 PM, Monday through Friday. He does not smoke. He drinks alcohol occasionally but has had heavier drinking in the past. He drinks coffee one cup per day and typically no additional caffeine, occasional sodas. Has not lost any significant amount of weight. He typically stays above the 200 pound mark and fluctuates a few pounds at a time. His brother has obstructive sleep apnea and uses a CPAP machine. This year, his son is also been diagnosed with OSA and has a CPAP machine now. His bedtime is usually around 10 or 10:30 and sometimes he has trouble going to sleep. He  has never taken a sleeping pill or over-the-counter sleep aid. His rise time is typically around 4:30 AM. I reviewed your office note from 05/30/2015.  His Past Medical History Is Significant For: Past Medical History:  Diagnosis Date  . Diabetes mellitus without complication ([place])   . Hyperlipemia   . Hypertension     His Past Surgical History Is Significant For: Past Surgical History:  Procedure Laterality Date  . FRACTURE SURGERY     R forearm    His Family History Is Significant For: Family History  Problem Relation Age of Onset  . Lung cancer Father   . Kidney cancer Brother     His Social History Is Significant For: Social History   Socioeconomic History  . Marital status: Divorced    Spouse name: Not on file  . Number of children: 2  . Years of education: College  . Highest education level: Not on file  Occupational History  . Not on file  Social Needs  . Financial resource strain: Not on file  . Food insecurity    Worry: Not on file    Inability: Not on file  . Transportation needs    Medical: Not on  file    Non-medical: Not on file  Tobacco Use  . Smoking status: Never Smoker  . Smokeless tobacco: Never Used  Substance and Sexual Activity  . Alcohol use: Yes    Alcohol/week: 0.0 standard drinks  . Drug use: No  . Sexual activity: Never    Birth control/protection: Abstinence  Lifestyle  . Physical activity    Days per week: Not on file    Minutes per session: Not on file  . Stress: Not on file  Relationships  . Social Herbalist on phone: Not on file    Gets together: Not on file    Attends religious service: Not on file    Active member of club or organization: Not on file    Attends meetings of clubs or organizations: Not on file    Relationship status: Not on file  Other Topics Concern  . Not on file  Social History Narrative  . Not on file    His Allergies Are:  No Known Allergies:   His Current Medications Are:  Outpatient Encounter Medications as of 09/07/2019  Medication Sig  . allopurinol (ZYLOPRIM) 100 MG tablet Take 1 tablet (100 mg total) by mouth daily.  Marland Kitchen amLODipine (NORVASC) 10 MG tablet Take 1 tablet (10 mg total) by mouth daily.  Marland Kitchen aspirin EC 81 MG tablet Take 81 mg by mouth daily.  Marland Kitchen atorvastatin (LIPITOR) 80 MG tablet Take 1 tablet (80 mg total) by mouth daily.  Marland Kitchen glipiZIDE (GLUCOTROL) 10 MG tablet TAKE ONE TABLET IN THE MORNING WITH MORNING MEAL AND 1/2 to 1 TABLET WITH EVENING MEAL.  Marland Kitchen losartan-hydrochlorothiazide (HYZAAR) 100-25 MG tablet Take 1 tablet by mouth daily.  . metFORMIN (GLUCOPHAGE) 1000 MG tablet Take 1 tablet (1,000 mg total) by mouth 2 (two) times daily with a meal.  . Multiple Vitamins-Minerals (ONE DAILY MENS PO)    No facility-administered encounter medications on file as of 09/07/2019.   :  Review of Systems:  Out of a complete 14 point review of systems, all are reviewed and negative with the exception of these symptoms as listed below: Review of Systems  Neurological:       Rm 1, RV Cpap/ DME [facility]. Doing ok.     Objective:  Neurological Exam  Physical Exam Physical Examination:   Vitals:   09/07/19 1249  BP: 132/73  Pulse: 85  Temp: 97.8 F (36.6 C)    General Examination: The patient is a very pleasant 73 y.o. male in no acute distress. He appears well-developed and well-nourished and well groomed.   HEENT:Normocephalic, atraumatic, pupils are equal, round and reactive to light and accommodation. Extraocular tracking is good without limitation to gaze excursion or nystagmus noted. Normal smooth pursuit is noted. Hearing is grossly intact. Face is symmetric with normal facial animation and normal facial sensation. Speech is clear with no dysarthria noted. There is no hypophonia. There is no lip, neck/head, jaw or voice tremor. Neck is supple with full range of passive and active motion. Oropharynx exam reveals: mildmouth dryness, adequatedental hygiene and moderateairway crowding.   Chest:Clear to auscultation without wheezing, rhonchi or crackles noted.  Heart:S1+S2+0, regular and normal without murmurs, rubs or gallops noted.   Abdomen:Soft, non-tender and non-distended with normal bowel sounds appreciated on auscultation.  Extremities:There isnopitting edema in the distal lower extremities bilaterally.   Skin: Warm and dry without trophic changes noted.  Musculoskeletal: exam reveals no obvious joint deformities, tenderness or joint swelling or erythema.Unremarkable surgical scars right forearm.  Neurologically:  Mental status: The patient is awake, alert and oriented in all 4 spheres.Hisimmediate and remote memory, attention, language skills and fund of knowledge are appropriate. There is no evidence of aphasia, agnosia, apraxia or anomia. Speech is clear with normal prosody and enunciation. Thought process is linear. Mood is normaland affect is normal.  Cranial nerves II - XII are as described above under HEENT exam.  Motor exam: Normal bulk, strength and  tone is noted. There is no drift, tremor or rebound. Romberg is negative, except slight initial sway. Fine motor skills and coordination: intact. Cerebellar testing: No dysmetria or intention tremor. There is no truncal or gait ataxia.  Sensory exam: intact to light touch in the upper and lower extremities.  Gait, station and balance:Hestands easily. No veering to one side is noted. No leaning to one side is noted. Posture is age-appropriate and stance is narrow based. Gait showsnormalstride length and normalpace. No problems turning are noted.    Assessmentand plan:  In summary,Kirkland C Robbinsis a very pleasant 55 year oldmalewith an underlying medical history of type 2 diabetes, hypertension, hyperlipidemia and obesity, who presents for follow-up consultation of his severe obstructive sleep apnea as determined by a HST on 08/15/2015. Unfortunately, he did not have an attended sleep study or CPAP titration study. He has been on AutoPap therapy and has been compliant with it. last year we tried him on CPAP therapy of 10 cm but he could not tolerate it. He stopped using his CPAP but was able to resume AutoPap therapy with success, he is compliant with treatment. He is commended for his treatment adherence.  He is advised to be consistent with his AutoPap therapy and follow-up routinely in 1 year, I placed an order for his supplies.  I answered all his questions today and he was in agreement. Of note, he did a overnight pulse oximetry test in November 2017 with benign results while on AutoPap therapy. I spent 15 minutes in total face-to-face time with the patient, more than 50% of which was spent in counseling and coordination of care, reviewing test results, reviewing medication and discussing or reviewing the diagnosis of OSA, its prognosis and treatment options. Pertinent laboratory and imaging test results that were available during this visit with the patient were  reviewed by me and considered  in my medical decision making (see chart for details).

## 2019-09-07 NOTE — Progress Notes (Signed)
FAXED to [facility] cpap [facility]. [phone]. Received.

## 2019-09-26 DIAGNOSIS — G4733 Obstructive sleep apnea (adult) (pediatric): Secondary | ICD-10-CM | POA: Diagnosis not present

## 2019-10-24 ENCOUNTER — Other Ambulatory Visit: Payer: Self-pay | Admitting: Family Medicine

## 2019-10-24 DIAGNOSIS — E119 Type 2 diabetes mellitus without complications: Secondary | ICD-10-CM

## 2019-12-04 ENCOUNTER — Ambulatory Visit (INDEPENDENT_AMBULATORY_CARE_PROVIDER_SITE_OTHER): Payer: Medicare HMO | Admitting: Family Medicine

## 2019-12-04 ENCOUNTER — Encounter: Payer: Self-pay | Admitting: Family Medicine

## 2019-12-04 ENCOUNTER — Other Ambulatory Visit: Payer: Self-pay

## 2019-12-04 VITALS — BP 124/70 | HR 81 | Temp 98.2°F | Ht 65.0 in | Wt 206.0 lb

## 2019-12-04 DIAGNOSIS — M109 Gout, unspecified: Secondary | ICD-10-CM | POA: Diagnosis not present

## 2019-12-04 DIAGNOSIS — E785 Hyperlipidemia, unspecified: Secondary | ICD-10-CM

## 2019-12-04 DIAGNOSIS — E1165 Type 2 diabetes mellitus with hyperglycemia: Secondary | ICD-10-CM

## 2019-12-04 DIAGNOSIS — M1A0790 Idiopathic chronic gout, unspecified ankle and foot, without tophus (tophi): Secondary | ICD-10-CM | POA: Diagnosis not present

## 2019-12-04 DIAGNOSIS — I10 Essential (primary) hypertension: Secondary | ICD-10-CM | POA: Diagnosis not present

## 2019-12-04 DIAGNOSIS — Z7189 Other specified counseling: Secondary | ICD-10-CM

## 2019-12-04 MED ORDER — GLIPIZIDE 10 MG PO TABS: ORAL_TABLET | ORAL | 1 refills | Status: DC

## 2019-12-04 MED ORDER — AMLODIPINE BESYLATE 10 MG PO TABS: 10.0000 mg | ORAL_TABLET | Freq: Every day | ORAL | 1 refills | Status: DC

## 2019-12-04 MED ORDER — ALLOPURINOL 100 MG PO TABS: 100.0000 mg | ORAL_TABLET | Freq: Every day | ORAL | 1 refills | Status: DC

## 2019-12-04 MED ORDER — METFORMIN HCL 1000 MG PO TABS: 1000.0000 mg | ORAL_TABLET | Freq: Two times a day (BID) | ORAL | 1 refills | Status: DC

## 2019-12-04 MED ORDER — LOSARTAN POTASSIUM-HCTZ 100-25 MG PO TABS: 1.0000 | ORAL_TABLET | Freq: Every day | ORAL | 1 refills | Status: DC

## 2019-12-04 MED ORDER — ATORVASTATIN CALCIUM 80 MG PO TABS: 80.0000 mg | ORAL_TABLET | Freq: Every day | ORAL | 1 refills | Status: DC

## 2019-12-04 NOTE — Progress Notes (Signed)
Subjective:  Patient ID: Derrick Becker, male    DOB: 1945-10-25  Age: 74 y.o. MRN: [id number]  CC:  Chief Complaint  Patient presents with  . Follow-up    on type 2 diabetes, hyperlipidemia, and hypertension. pt states he is doing well controling his diabetes. pt states his BS is sometimes high in the morning depending on what he had for dinner the night before. pt checks BS once daily.  pt reports he is doing well controling his hypertension and hyperlipidemia. no physical symptoms. pt states medication is working well with no side effects    HPI Derrick Becker presents for   Diabetes: Complicated by history of hyperglycemia.  Slight elevated but stable A1c at 7.5 in November.  Plan to increase physical activity as well as monitoring diet.  He was continued on glipizide 10 mg in the morning and 10 mg in the evening.  Metformin 1000 mg twice daily. No new med side effects.  He is on statin as well as ARB. Has been walking more for exercise.  Home readings fasting - 70's-158.  Microalbumin: Normal ratio 08/28/2019 Optho, foot exam, pneumovax: Up-to-date  Lab Results  Component Value Date   HGBA1C 7.5 (H) 08/28/2019   HGBA1C 7.6 (H) 04/19/2019   HGBA1C 7.0 (A) 10/13/2018   Lab Results  Component Value Date   MICROALBUR 18.2 (H) 12/22/2014   LDLCALC 80 04/19/2019   CREATININE 1.03 04/19/2019    Hypertension: Takes amlodipine 10 mg daily, losartan HCTZ 100/25 mg daily. Home readings: 116-120/58-70. No new side effects.  Feels well.  BP Readings from Last 3 Encounters:  12/04/19 124/70  09/07/19 132/73  08/28/19 114/65   Lab Results  Component Value Date   CREATININE 1.03 04/19/2019   Hyperlipidemia: Lipitor 80 mg daily.  On aspirin 81 mg daily as well. Lab Results  Component Value Date   CHOL 151 04/19/2019   HDL 47 04/19/2019   LDLCALC 80 04/19/2019   TRIG 119 04/19/2019   CHOLHDL 3.2 04/19/2019   Lab Results  Component Value Date   ALT 24 04/19/2019   AST 25 04/19/2019   ALKPHOS 67 04/19/2019   BILITOT 0.5 04/19/2019   Gout: Last flare: over a year ago.  Daily meds: allopurinol 100mg  qd. No new side effects.  Prn med: colchine if needed.  Lab Results  Component Value Date   LABURIC 5.7 04/19/2019   Will be in town 6-8 weeks, plan for covid vaccine now.  Has been taking care of family member in [place], [place].   History Patient Active Problem List   Diagnosis Date Noted  . Hyperlipidemia 10/12/2013  . Diabetes ([place]) 08/23/2012  . HTN (hypertension) 08/23/2012   Past Medical History:  Diagnosis Date  . Diabetes mellitus without complication ([place])   . Hyperlipemia   . Hypertension    Past Surgical History:  Procedure Laterality Date  . FRACTURE SURGERY     R forearm   No Known Allergies Prior to Admission medications   Medication Sig Start Date End Date Taking? Authorizing Provider  allopurinol (ZYLOPRIM) 100 MG tablet Take 1 tablet (100 mg total) by mouth daily. 08/28/19  Yes [name], MD  amLODipine (NORVASC) 10 MG tablet Take 1 tablet (10 mg total) by mouth daily. 08/28/19  Yes [name], MD  aspirin EC 81 MG tablet Take 81 mg by mouth daily.   Yes [provider]  atorvastatin (LIPITOR) 80 MG tablet Take 1 tablet (80 mg total) by mouth  daily. 08/28/19  Yes [name], MD  glipiZIDE (GLUCOTROL) 10 MG tablet TAKE ONE TABLET IN THE MORNING WITH MORNING MEAL AND 1/2 TO 1 TABLET WITH EVENING MEAL. 10/24/19  Yes [name], MD  losartan-hydrochlorothiazide (HYZAAR) 100-25 MG tablet Take 1 tablet by mouth daily. 08/28/19  Yes [name], MD  metFORMIN (GLUCOPHAGE) 1000 MG tablet Take 1 tablet (1,000 mg total) by mouth 2 (two) times daily with a meal. 08/28/19  Yes [name], MD  Multiple Vitamins-Minerals (ONE DAILY MENS PO)  03/02/18  Yes [provider]   Social History   Socioeconomic History  . Marital status: Divorced    Spouse name: Not on file  . Number of  children: 2  . Years of education: College  . Highest education level: Not on file  Occupational History  . Not on file  Tobacco Use  . Smoking status: Never Smoker  . Smokeless tobacco: Never Used  Substance and Sexual Activity  . Alcohol use: Yes    Alcohol/week: 0.0 standard drinks  . Drug use: No  . Sexual activity: Never    Birth control/protection: Abstinence  Other Topics Concern  . Not on file  Social History Narrative  . Not on file   Social Determinants of Health   Financial Resource Strain:   . Difficulty of Paying Living Expenses: Not on file  Food Insecurity:   . Worried About Charity fundraiser in the Last Year: Not on file  . Ran Out of Food in the Last Year: Not on file  Transportation Needs:   . Lack of Transportation (Medical): Not on file  . Lack of Transportation (Non-Medical): Not on file  Physical Activity:   . Days of Exercise per Week: Not on file  . Minutes of Exercise per Session: Not on file  Stress:   . Feeling of Stress : Not on file  Social Connections:   . Frequency of Communication with Friends and Family: Not on file  . Frequency of Social Gatherings with Friends and Family: Not on file  . Attends Religious Services: Not on file  . Active Member of Clubs or Organizations: Not on file  . Attends Archivist Meetings: Not on file  . Marital Status: Not on file  Intimate Partner Violence:   . Fear of Current or Ex-Partner: Not on file  . Emotionally Abused: Not on file  . Physically Abused: Not on file  . Sexually Abused: Not on file    Review of Systems  Constitutional: Negative for fatigue and unexpected weight change.  Eyes: Negative for visual disturbance.  Respiratory: Negative for cough, chest tightness and shortness of breath.   Cardiovascular: Negative for chest pain, palpitations and leg swelling.  Gastrointestinal: Negative for abdominal pain and blood in stool.  Neurological: Negative for dizziness,  light-headedness and headaches.     Objective:   Vitals:   12/04/19 0933 12/04/19 0937  BP: (!) 162/75 124/70  Pulse: 81   Temp: 98.2 F (36.8 C)   TempSrc: Temporal   SpO2: 96%   Weight: 206 lb (93.4 kg)   Height: 5\' 5"  (1.651 m)      Physical Exam Vitals reviewed.  Constitutional:      Appearance: He is well-developed.  HENT:     Head: Normocephalic and atraumatic.  Eyes:     Pupils: Pupils are equal, round, and reactive to light.  Neck:     Vascular: No carotid bruit or JVD.  Cardiovascular:  Rate and Rhythm: Normal rate and regular rhythm.     Heart sounds: Normal heart sounds. No murmur.  Pulmonary:     Effort: Pulmonary effort is normal.     Breath sounds: Normal breath sounds. No rales.  Skin:    General: Skin is warm and dry.  Neurological:     Mental Status: He is alert and oriented to person, place, and time.  Psychiatric:        Mood and Affect: Mood normal.     Assessment & Plan:  FAHD ANTCZAK is a 74 y.o. male . Type 2 diabetes mellitus with hyperglycemia, without long-term current use of insulin (HCC) - Plan: Hemoglobin A1c  -Borderline control.  Tolerating current regimen. Medications refilled. Labs pending as above.  Consider SGLT2 to addition or GLP-1 depending on A1c.  Continue to watch diet, exercise.  Hyperlipidemia, unspecified hyperlipidemia type - Plan: Lipid panel, atorvastatin (LIPITOR) 80 MG tablet  -  Stable, tolerating current regimen. Medications refilled. Labs pending as above.   Essential hypertension - Plan: Comprehensive metabolic panel, Lipid panel, losartan-hydrochlorothiazide (HYZAAR) 100-25 MG tablet, amLODipine (NORVASC) 10 MG tablet  -  Stable, tolerating current regimen. Medications refilled. Labs pending as above.   Gout, unspecified cause, unspecified chronicity, unspecified site - Plan: Uric Acid  -Stable with allopurinol use.  Continue same.  Counseled about COVID-19 virus infection  -Discussed Covid  vaccines, different vaccines and availability.  Plans to obtain Covid vaccine locally as would not be traveling for the next 6 to 8 weeks.  We will add to wait list.   Meds ordered this encounter  Medications  . losartan-hydrochlorothiazide (HYZAAR) 100-25 MG tablet    Sig: Take 1 tablet by mouth daily.    Dispense:  90 tablet    Refill:  1  . glipiZIDE (GLUCOTROL) 10 MG tablet    Sig: TAKE ONE TABLET IN THE MORNING WITH MORNING MEAL AND 1 TABLET WITH EVENING MEAL.    Dispense:  180 tablet    Refill:  1  . metFORMIN (GLUCOPHAGE) 1000 MG tablet    Sig: Take 1 tablet (1,000 mg total) by mouth 2 (two) times daily with a meal.    Dispense:  180 tablet    Refill:  1  . atorvastatin (LIPITOR) 80 MG tablet    Sig: Take 1 tablet (80 mg total) by mouth daily.    Dispense:  90 tablet    Refill:  1  . amLODipine (NORVASC) 10 MG tablet    Sig: Take 1 tablet (10 mg total) by mouth daily.    Dispense:  90 tablet    Refill:  1  . allopurinol (ZYLOPRIM) 100 MG tablet    Sig: Take 1 tablet (100 mg total) by mouth daily.    Dispense:  90 tablet    Refill:  1   Patient Instructions   COVID-19 Vaccine Information can be found at: [email or web address].uk For questions related to vaccine distribution or appointments, please email [email or web address] or call [phone].    No med changes for now - will let you know once labs are back if additional meds needed. Recheck in 6 months.     If you have lab work done today you will be contacted with your lab results within the next 2 weeks.  If you have not heard from [place] then please contact us. The fastest way to get your results is to register for My Chart.   IF you received an x-ray today, you will receive an invoice  from [facility]. Please contact [facility] at [phone] with questions or concerns regarding your invoice.   IF you received labwork today, you will  receive an invoice from [facility]. Please contact [facility] at [phone] with questions or concerns regarding your invoice.   Our billing staff will not be able to assist you with questions regarding bills from these companies.  You will be contacted with the lab results as soon as they are available. The fastest way to get your results is to activate your My Chart account. Instructions are located on the last page of this paperwork. If you have not heard from [place] regarding the results in 2 weeks, please contact this office.         Signed, Merri Ray, MD [facility]

## 2019-12-04 NOTE — Patient Instructions (Addendum)
COVID-19 Vaccine Information can be found at: ShippingScam.co.uk For questions related to vaccine distribution or appointments, please email [email or web address] or call [phone].    No med changes for now - will let you know once labs are back if additional meds needed. Recheck in 6 months.     If you have lab work done today you will be contacted with your lab results within the next 2 weeks.  If you have not heard from [place] then please contact us. The fastest way to get your results is to register for My Chart.   IF you received an x-ray today, you will receive an invoice from [facility]. Please contact [facility] at [phone] with questions or concerns regarding your invoice.   IF you received labwork today, you will receive an invoice from [place]. Please contact [facility] at [phone] with questions or concerns regarding your invoice.   Our billing staff will not be able to assist you with questions regarding bills from these companies.  You will be contacted with the lab results as soon as they are available. The fastest way to get your results is to activate your My Chart account. Instructions are located on the last page of this paperwork. If you have not heard from [place] regarding the results in 2 weeks, please contact this office.

## 2019-12-05 LAB — HEMOGLOBIN A1C
Est. average glucose Bld gHb Est-mCnc: 157 mg/dL
Hgb A1c MFr Bld: 7.1 % — ABNORMAL HIGH (ref 4.8–5.6)

## 2019-12-05 LAB — COMPREHENSIVE METABOLIC PANEL
ALT: 20 IU/L (ref 0–44)
AST: 22 IU/L (ref 0–40)
Albumin/Globulin Ratio: 1.7 (ref 1.2–2.2)
Albumin: 4.4 g/dL (ref 3.7–4.7)
Alkaline Phosphatase: 66 IU/L (ref 39–117)
BUN/Creatinine Ratio: 18 (ref 10–24)
BUN: 19 mg/dL (ref 8–27)
Bilirubin Total: 0.5 mg/dL (ref 0.0–1.2)
CO2: 18 mmol/L — ABNORMAL LOW (ref 20–29)
Calcium: 9.3 mg/dL (ref 8.6–10.2)
Chloride: 101 mmol/L (ref 96–106)
Creatinine, Ser: 1.03 mg/dL (ref 0.76–1.27)
GFR calc Af Amer: 83 mL/min/{1.73_m2} (ref >59)
GFR calc non Af Amer: 72 mL/min/{1.73_m2} (ref >59)
Globulin, Total: 2.6 g/dL (ref 1.5–4.5)
Glucose: 127 mg/dL — ABNORMAL HIGH (ref 65–99)
Potassium: 4.5 mmol/L (ref 3.5–5.2)
Sodium: 139 mmol/L (ref 134–144)
Total Protein: 7 g/dL (ref 6.0–8.5)

## 2019-12-05 LAB — LIPID PANEL
Chol/HDL Ratio: 3.5 ratio (ref 0.0–5.0)
Cholesterol, Total: 140 mg/dL (ref 100–199)
HDL: 40 mg/dL (ref >39)
LDL Chol Calc (NIH): 84 mg/dL (ref 0–99)
Triglycerides: 80 mg/dL (ref 0–149)
VLDL Cholesterol Cal: 16 mg/dL (ref 5–40)

## 2019-12-05 LAB — URIC ACID: Uric Acid: 8 mg/dL (ref 3.8–8.4)

## 2020-02-14 ENCOUNTER — Ambulatory Visit (INDEPENDENT_AMBULATORY_CARE_PROVIDER_SITE_OTHER): Payer: Medicare HMO | Admitting: Family Medicine

## 2020-02-14 VITALS — BP 132/73 | Ht 65.0 in | Wt 206.0 lb

## 2020-02-14 DIAGNOSIS — Z0000 Encounter for general adult medical examination without abnormal findings: Secondary | ICD-10-CM | POA: Diagnosis not present

## 2020-02-14 NOTE — Progress Notes (Signed)
Presents today for [employer] Visit   Date of last exam: 12-04-2019   Interpreter used for this visit? No  I connected with  Derrick Becker on 02/14/20 by telephone  and verified that I am speaking with the correct person using two identifiers.   I discussed the limitations of evaluation and management by telemedicine. The patient expressed understanding and agreed to proceed.   Patient Care Team: [name], MD as PCP - General (Family Medicine)   Other items to address today:   Discussed Eye/Dental Discussed immunizations 6th month follow up scheduled 9-1 @ 8:00am  Other Screening: Last screening for diabetes: 12-04-2019 Last lipid screening: 12/04/2019  ADVANCE DIRECTIVES: Discussed: yes On File: no Materials Provided: yes  Immunization status:  Immunization History  Administered Date(s) Administered  . Influenza Split 08/23/2012  . Influenza, High Dose Seasonal PF 07/12/2018  . Influenza,inj,Quad PF,6+ Mos 09/06/2017  . Moderna SARS-COVID-2 Vaccination 12/15/2019, 01/15/2020  . Pneumococcal Conjugate-13 02/02/2017  . Pneumococcal Polysaccharide-23 03/14/2018     Health Maintenance Due  Topic Date Due  . TETANUS/TDAP  Never done     Functional Status Survey: Is the patient deaf or have difficulty hearing?: No Does the patient have difficulty seeing, even when wearing glasses/contacts?: No Does the patient have difficulty concentrating, remembering, or making decisions?: No Does the patient have difficulty walking or climbing stairs?: No Does the patient have difficulty dressing or bathing?: No Does the patient have difficulty doing errands alone such as visiting a doctor's office or shopping?: No   6CIT Screen 02/14/2020 01/04/2019  What Year? 0 points 0 points  What month? 0 points 0 points  What time? 0 points 0 points  Count back from 20 0 points 0 points  Months in reverse 0 points 0 points  Repeat phrase 0 points 0 points    Total Score 0 0        Clinical Support from 02/14/2020 in [facility] [place] [facility]  AUDIT-C Score  2       Home Environment:   Lives in a two story home No difficulty climbing stairs No scattered rugs  Yes grab bars Adequate lighting/ no clutter   Patient Active Problem List   Diagnosis Date Noted  . Hyperlipidemia 10/12/2013  . Diabetes ([place]) 08/23/2012  . HTN (hypertension) 08/23/2012     Past Medical History:  Diagnosis Date  . Diabetes mellitus without complication ([place])   . Hyperlipemia   . Hypertension      Past Surgical History:  Procedure Laterality Date  . FRACTURE SURGERY     R forearm     Family History  Problem Relation Age of Onset  . Lung cancer Father   . Kidney cancer Brother      Social History   Socioeconomic History  . Marital status: Divorced    Spouse name: Not on file  . Number of children: 2  . Years of education: College  . Highest education level: Not on file  Occupational History  . Not on file  Tobacco Use  . Smoking status: Never Smoker  . Smokeless tobacco: Never Used  Substance and Sexual Activity  . Alcohol use: Yes    Alcohol/week: 0.0 standard drinks  . Drug use: No  . Sexual activity: Never    Birth control/protection: Abstinence  Other Topics Concern  . Not on file  Social History Narrative  . Not on file   Social Determinants of Health   Financial Resource  Strain:   . Difficulty of Paying Living Expenses:   Food Insecurity:   . Worried About Charity fundraiser in the Last Year:   . Arboriculturist in the Last Year:   Transportation Needs:   . Film/video editor (Medical):   Marland Kitchen Lack of Transportation (Non-Medical):   Physical Activity:   . Days of Exercise per Week:   . Minutes of Exercise per Session:   Stress:   . Feeling of Stress :   Social Connections:   . Frequency of Communication with Friends and Family:   . Frequency of Social Gatherings with Friends and Family:   . Attends  Religious Services:   . Active Member of Clubs or Organizations:   . Attends Archivist Meetings:   Marland Kitchen Marital Status:   Intimate Partner Violence:   . Fear of Current or Ex-Partner:   . Emotionally Abused:   Marland Kitchen Physically Abused:   . Sexually Abused:      No Known Allergies   Prior to Admission medications   Medication Sig Start Date End Date Taking? Authorizing Provider  allopurinol (ZYLOPRIM) 100 MG tablet Take 1 tablet (100 mg total) by mouth daily. 12/04/19  Yes [name], MD  amLODipine (NORVASC) 10 MG tablet Take 1 tablet (10 mg total) by mouth daily. 12/04/19  Yes [name], MD  aspirin EC 81 MG tablet Take 81 mg by mouth daily.   Yes [provider]  atorvastatin (LIPITOR) 80 MG tablet Take 1 tablet (80 mg total) by mouth daily. 12/04/19  Yes [name], MD  glipiZIDE (GLUCOTROL) 10 MG tablet TAKE ONE TABLET IN THE MORNING WITH MORNING MEAL AND 1 TABLET WITH EVENING MEAL. 12/04/19  Yes [name], MD  losartan-hydrochlorothiazide (HYZAAR) 100-25 MG tablet Take 1 tablet by mouth daily. 12/04/19  Yes [name], MD  metFORMIN (GLUCOPHAGE) 1000 MG tablet Take 1 tablet (1,000 mg total) by mouth 2 (two) times daily with a meal. 12/04/19  Yes [name], MD  Multiple Vitamins-Minerals (ONE DAILY MENS PO)  03/02/18  Yes [provider]     Depression screen [facility] 2/9 02/14/2020 12/04/2019 08/28/2019 04/19/2019 01/04/2019  Decreased Interest 0 0 0 0 0  Down, Depressed, Hopeless 0 0 0 0 0  PHQ - 2 Score 0 0 0 0 0     Fall Risk  02/14/2020 12/04/2019 08/28/2019 04/19/2019 01/04/2019  Falls in the past year? 0 0 0 0 0  Number falls in past yr: 0 0 0 0 0  Injury with Fall? 0 0 0 0 0  Follow up Falls evaluation completed;Education provided Falls evaluation completed Falls evaluation completed Falls evaluation completed -      PHYSICAL EXAM: BP 132/73 Comment: taken from a previous visit  Ht 5\' 5"  (1.651 m)   Wt 206 lb (93.4 kg)    BMI 34.28 kg/m    Wt Readings from Last 3 Encounters:  02/14/20 206 lb (93.4 kg)  12/04/19 206 lb (93.4 kg)  09/07/19 208 lb 3.2 oz (94.4 kg)       Education/Counseling provided regarding diet and exercise, prevention of chronic diseases, smoking/tobacco cessation, if applicable, and reviewed "Covered Medicare Preventive Services."

## 2020-02-14 NOTE — Patient Instructions (Addendum)
Thank you for taking time to come for your Medicare Wellness Visit. I appreciate your ongoing commitment to your health goals. Please review the following plan we discussed and let me know if I can assist you in the future.  Leroy Kennedy LPN  reventive Care 74 Years and Older, Male Preventive care refers to lifestyle choices and visits with your health care provider that can promote health and wellness. This includes:  A yearly physical exam. This is also called an annual well check.  Regular dental and eye exams.  Immunizations.  Screening for certain conditions.  Healthy lifestyle choices, such as diet and exercise. What can I expect for my preventive care visit? Physical exam Your health care provider will check:  Height and weight. These may be used to calculate body mass index (BMI), which is a measurement that tells if you are at a healthy weight.  Heart rate and blood pressure.  Your skin for abnormal spots. Counseling Your health care provider may ask you questions about:  Alcohol, tobacco, and drug use.  Emotional well-being.  Home and relationship well-being.  Sexual activity.  Eating habits.  History of falls.  Memory and ability to understand (cognition).  Work and work Statistician. What immunizations do I need?  Influenza (flu) vaccine  This is recommended every year. Tetanus, diphtheria, and pertussis (Tdap) vaccine  You may need a Td booster every 10 years. Varicella (chickenpox) vaccine  You may need this vaccine if you have not already been vaccinated. Zoster (shingles) vaccine  You may need this after age 56. Pneumococcal conjugate (PCV13) vaccine  One dose is recommended after age 74. Pneumococcal polysaccharide (PPSV23) vaccine  One dose is recommended after age 20. Measles, mumps, and rubella (MMR) vaccine  You may need at least one dose of MMR if you were born in 1957 or later. You may also need a second dose. Meningococcal  conjugate (MenACWY) vaccine  You may need this if you have certain conditions. Hepatitis A vaccine  You may need this if you have certain conditions or if you travel or work in places where you may be exposed to hepatitis A. Hepatitis B vaccine  You may need this if you have certain conditions or if you travel or work in places where you may be exposed to hepatitis B. Haemophilus influenzae type b (Hib) vaccine  You may need this if you have certain conditions. You may receive vaccines as individual doses or as more than one vaccine together in one shot (combination vaccines). Talk with your health care provider about the risks and benefits of combination vaccines. What tests do I need? Blood tests  Lipid and cholesterol levels. These may be checked every 5 years, or more frequently depending on your overall health.  Hepatitis C test.  Hepatitis B test. Screening  Lung cancer screening. You may have this screening every year starting at age 74 if you have a 30-pack-year history of smoking and currently smoke or have quit within the past 15 years.  Colorectal cancer screening. All adults should have this screening starting at age 74 and continuing until age 40. Your health care provider may recommend screening at age 20 if you are at increased risk. You will have tests every 1-10 years, depending on your results and the type of screening test.  Prostate cancer screening. Recommendations will vary depending on your family history and other risks.  Diabetes screening. This is done by checking your blood sugar (glucose) after you have not eaten for  a while (fasting). You may have this done every 1-3 years.  Abdominal aortic aneurysm (AAA) screening. You may need this if you are a current or former smoker.  Sexually transmitted disease (STD) testing. Follow these instructions at home: Eating and drinking  Eat a diet that includes fresh fruits and vegetables, whole grains, lean  protein, and low-fat dairy products. Limit your intake of foods with high amounts of sugar, saturated fats, and salt.  Take vitamin and mineral supplements as recommended by your health care provider.  Do not drink alcohol if your health care provider tells you not to drink.  If you drink alcohol: ? Limit how much you have to 0-2 drinks a day. ? Be aware of how much alcohol is in your drink. In the [place] one drink equals one 12 oz bottle of beer (355 mL), one 5 oz glass of wine (148 mL), or one 1 oz glass of hard liquor (44 mL). Lifestyle  Take daily care of your teeth and gums.  Stay active. Exercise for at least 30 minutes on 5 or more days each week.  Do not use any products that contain nicotine or tobacco, such as cigarettes, e-cigarettes, and chewing tobacco. If you need help quitting, ask your health care provider.  If you are sexually active, practice safe sex. Use a condom or other form of protection to prevent STIs (sexually transmitted infections).  Talk with your health care provider about taking a low-dose aspirin or statin. What's next?  Visit your health care provider once a year for a well check visit.  Ask your health care provider how often you should have your eyes and teeth checked.  Stay up to date on all vaccines. This information is not intended to replace advice given to you by your health care provider. Make sure you discuss any questions you have with your health care provider. Document Revised: 09/15/2018 Document Reviewed: 09/15/2018 Elsevier Patient Education  2020 Elsevier Inc.  

## 2020-06-01 ENCOUNTER — Other Ambulatory Visit: Payer: Self-pay | Admitting: Family Medicine

## 2020-06-01 DIAGNOSIS — M1A0790 Idiopathic chronic gout, unspecified ankle and foot, without tophus (tophi): Secondary | ICD-10-CM

## 2020-06-01 DIAGNOSIS — E785 Hyperlipidemia, unspecified: Secondary | ICD-10-CM

## 2020-06-01 DIAGNOSIS — I10 Essential (primary) hypertension: Secondary | ICD-10-CM

## 2020-06-01 DIAGNOSIS — E1165 Type 2 diabetes mellitus with hyperglycemia: Secondary | ICD-10-CM

## 2020-06-05 ENCOUNTER — Encounter: Payer: Self-pay | Admitting: Family Medicine

## 2020-06-05 ENCOUNTER — Ambulatory Visit (INDEPENDENT_AMBULATORY_CARE_PROVIDER_SITE_OTHER): Payer: Medicare HMO | Admitting: Family Medicine

## 2020-06-05 ENCOUNTER — Other Ambulatory Visit: Payer: Self-pay

## 2020-06-05 VITALS — BP 127/67 | HR 83 | Temp 98.1°F | Ht 65.0 in | Wt 193.0 lb

## 2020-06-05 DIAGNOSIS — M109 Gout, unspecified: Secondary | ICD-10-CM | POA: Diagnosis not present

## 2020-06-05 DIAGNOSIS — E785 Hyperlipidemia, unspecified: Secondary | ICD-10-CM

## 2020-06-05 DIAGNOSIS — E1165 Type 2 diabetes mellitus with hyperglycemia: Secondary | ICD-10-CM | POA: Diagnosis not present

## 2020-06-05 DIAGNOSIS — I10 Essential (primary) hypertension: Secondary | ICD-10-CM

## 2020-06-05 MED ORDER — COLCHICINE 0.6 MG PO TABS: ORAL_TABLET | ORAL | 2 refills | Status: DC

## 2020-06-05 MED ORDER — LOSARTAN POTASSIUM-HCTZ 100-25 MG PO TABS: 1.0000 | ORAL_TABLET | Freq: Every day | ORAL | 1 refills | Status: DC

## 2020-06-05 MED ORDER — METFORMIN HCL 1000 MG PO TABS: ORAL_TABLET | ORAL | 1 refills | Status: DC

## 2020-06-05 MED ORDER — AMLODIPINE BESYLATE 10 MG PO TABS: 10.0000 mg | ORAL_TABLET | Freq: Every day | ORAL | 1 refills | Status: DC

## 2020-06-05 MED ORDER — GLIPIZIDE 10 MG PO TABS: ORAL_TABLET | ORAL | 1 refills | Status: DC

## 2020-06-05 NOTE — Progress Notes (Signed)
Subjective:  Patient ID: Derrick Becker, male    DOB: Feb 15, 1946  Age: 74 y.o. MRN: [id number]  CC:  Chief Complaint  Patient presents with  . Follow-up    on hypertension,hyperlipidemia, and diabetes. Pt reports no issues with BP since last OV. pt reportsh e checks his BP every other day. pt reports his BP runs around 115-128/65-66.Pt reports no physical symptoms of hypertension/hyperlipidemia. Pt reports no issues with his diabetes since last OV. pt repots he checks his BS every other day and gets a home BS reading around the 70's- 120's depending on what the pt ate the day before. pt states he has seen it at 160mg /dl before.    HPI Derrick Becker presents for   Hypertension: Treated with losartan HCTZ 100/25 mg daily, amlodipine 10 mg daily. Home readings 115-128/65-66 Uses CPAP for obstructive sleep apnea BP Readings from Last 3 Encounters:  06/05/20 127/67  02/14/20 132/73  12/04/19 124/70   Lab Results  Component Value Date   CREATININE 1.03 12/04/2019   Hyperlipidemia: Lipitor 80 mg daily Lab Results  Component Value Date   CHOL 140 12/04/2019   HDL 40 12/04/2019   LDLCALC 84 12/04/2019   TRIG 80 12/04/2019   CHOLHDL 3.5 12/04/2019   Lab Results  Component Value Date   ALT 20 12/04/2019   AST 22 12/04/2019   ALKPHOS 66 12/04/2019   BILITOT 0.5 12/04/2019   Diabetes: Complicated by hyperglycemia Treated with Metformin 1000 mg twice daily, Glucotrol grams twice daily.  He is on aspirin.  He is on ARB and statin as above. Home readings:  Fasting 114 today, 70-160 range.  No symptomatic lows.  Microalbumin: Normal ratio 08/28/2019 Optho, foot exam, pneumovax: Up-to-date  Lab Results  Component Value Date   HGBA1C 7.1 (H) 12/04/2019   HGBA1C 7.5 (H) 08/28/2019   HGBA1C 7.6 (H) 04/19/2019   Lab Results  Component Value Date   MICROALBUR 18.2 (H) 12/22/2014   LDLCALC 84 12/04/2019   CREATININE 1.03 12/04/2019   Gout: Last flare: Over a year ago. No  recent flare.  Daily meds: Allopurinol 100 mg daily Prn med: Colchicine as needed - none needed. Out of med.  Lab Results  Component Value Date   LABURIC 8.0 12/04/2019    Had both covid vaccines.  Immunization History  Administered Date(s) Administered  . Influenza Split 08/23/2012  . Influenza, High Dose Seasonal PF 07/12/2018  . Influenza,inj,Quad PF,6+ Mos 09/06/2017  . Moderna SARS-COVID-2 Vaccination 12/15/2019, 01/15/2020  . Pneumococcal Conjugate-13 02/02/2017  . Pneumococcal Polysaccharide-23 03/14/2018     History Patient Active Problem List   Diagnosis Date Noted  . Hyperlipidemia 10/12/2013  . Diabetes ([place]) 08/23/2012  . HTN (hypertension) 08/23/2012   Past Medical History:  Diagnosis Date  . Diabetes mellitus without complication ([place])   . Hyperlipemia   . Hypertension    Past Surgical History:  Procedure Laterality Date  . FRACTURE SURGERY     R forearm   No Known Allergies Prior to Admission medications   Medication Sig Start Date End Date Taking? Authorizing Provider  allopurinol (ZYLOPRIM) 100 MG tablet TAKE 1 TABLET EVERY DAY 06/01/20  Yes [name], MD  amLODipine (NORVASC) 10 MG tablet TAKE 1 TABLET EVERY DAY 06/01/20  Yes [name], MD  aspirin EC 81 MG tablet Take 81 mg by mouth daily.   Yes [provider]  atorvastatin (LIPITOR) 80 MG tablet TAKE 1 TABLET EVERY DAY 06/01/20  Yes [name],  MD  glipiZIDE (GLUCOTROL) 10 MG tablet TAKE ONE TABLET IN THE MORNING WITH MORNING MEAL AND 1 TABLET WITH EVENING MEAL. 12/04/19  Yes [name], MD  losartan-hydrochlorothiazide [facility]) 100-25 MG tablet TAKE 1 TABLET EVERY DAY 06/01/20  Yes [name], MD  metFORMIN (GLUCOPHAGE) 1000 MG tablet TAKE 1 TABLET TWICE DAILY WITH A MEAL 06/01/20  Yes [name], MD  Multiple Vitamins-Minerals (ONE DAILY MENS PO)  03/02/18  Yes [provider]   Social History   Socioeconomic History  . Marital status:  Divorced    Spouse name: Not on file  . Number of children: 2  . Years of education: College  . Highest education level: Not on file  Occupational History  . Not on file  Tobacco Use  . Smoking status: Never Smoker  . Smokeless tobacco: Never Used  Substance and Sexual Activity  . Alcohol use: Yes    Alcohol/week: 0.0 standard drinks  . Drug use: No  . Sexual activity: Never    Birth control/protection: Abstinence  Other Topics Concern  . Not on file  Social History Narrative  . Not on file   Social Determinants of Health   Financial Resource Strain:   . Difficulty of Paying Living Expenses: Not on file  Food Insecurity:   . Worried About Charity fundraiser in the Last Year: Not on file  . Ran Out of Food in the Last Year: Not on file  Transportation Needs:   . Lack of Transportation (Medical): Not on file  . Lack of Transportation (Non-Medical): Not on file  Physical Activity:   . Days of Exercise per Week: Not on file  . Minutes of Exercise per Session: Not on file  Stress:   . Feeling of Stress : Not on file  Social Connections:   . Frequency of Communication with Friends and Family: Not on file  . Frequency of Social Gatherings with Friends and Family: Not on file  . Attends Religious Services: Not on file  . Active Member of Clubs or Organizations: Not on file  . Attends Archivist Meetings: Not on file  . Marital Status: Not on file  Intimate Partner Violence:   . Fear of Current or Ex-Partner: Not on file  . Emotionally Abused: Not on file  . Physically Abused: Not on file  . Sexually Abused: Not on file    Review of Systems  Constitutional: Negative for fatigue and unexpected weight change.  Eyes: Negative for visual disturbance.  Respiratory: Negative for cough, chest tightness and shortness of breath.   Cardiovascular: Negative for chest pain, palpitations and leg swelling.  Gastrointestinal: Negative for abdominal pain and blood in stool.    Neurological: Negative for dizziness, light-headedness and headaches.     Objective:   Vitals:   06/05/20 0807  BP: 127/67  Pulse: 83  Temp: 98.1 F (36.7 C)  TempSrc: Temporal  SpO2: 96%  Weight: 193 lb (87.5 kg)  Height: 5\' 5"  (1.651 m)     Physical Exam Vitals reviewed.  Constitutional:      Appearance: He is well-developed.  HENT:     Head: Normocephalic and atraumatic.  Eyes:     Pupils: Pupils are equal, round, and reactive to light.  Neck:     Vascular: No carotid bruit or JVD.  Cardiovascular:     Rate and Rhythm: Normal rate and regular rhythm.     Heart sounds: Normal heart sounds. No murmur heard.  Pulmonary:     Effort: Pulmonary effort is normal.     Breath sounds: Normal breath sounds. No rales.  Musculoskeletal:     Right lower leg: No edema.     Left lower leg: No edema.  Skin:    General: Skin is warm and dry.  Neurological:     Mental Status: He is alert and oriented to person, place, and time.  Psychiatric:        Mood and Affect: Mood normal.        Behavior: Behavior normal.        Assessment & Plan:  Derrick Becker is a 74 y.o. male . Type 2 diabetes mellitus with hyperglycemia, without long-term current use of insulin (HCC) - Plan: Hemoglobin A1c, glipiZIDE (GLUCOTROL) 10 MG tablet, metFORMIN (GLUCOPHAGE) 1000 MG tablet  - cehkc A1c, no med changes. Hypoglycemic precautions.   Hyperlipidemia, unspecified hyperlipidemia type - Plan: Lipid panel, Comprehensive metabolic panel  - tolerating statin. Labs pending.   Essential hypertension - Plan: Lipid panel, Comprehensive metabolic panel, amLODipine (NORVASC) 10 MG tablet, losartan-hydrochlorothiazide (HYZAAR) 100-25 MG tablet  -  Stable, tolerating current regimen. Medications refilled. Labs pending as above.   Gout, unspecified cause, unspecified chronicity, unspecified site - Plan: colchicine 0.6 MG tablet  - stable. Continue allopurinol. Colchicine if needed for flare with  hold of statin for few days if used.   Meds ordered this encounter  Medications  . colchicine 0.6 MG tablet    Sig: 2 tablets once by mouth if gout flare, 1 additional tablet in 1 hour if not improved.    Dispense:  10 tablet    Refill:  2  . amLODipine (NORVASC) 10 MG tablet    Sig: Take 1 tablet (10 mg total) by mouth daily.    Dispense:  90 tablet    Refill:  1  . glipiZIDE (GLUCOTROL) 10 MG tablet    Sig: TAKE ONE TABLET IN THE MORNING WITH MORNING MEAL AND 1 TABLET WITH EVENING MEAL.    Dispense:  180 tablet    Refill:  1  . losartan-hydrochlorothiazide (HYZAAR) 100-25 MG tablet    Sig: Take 1 tablet by mouth daily.    Dispense:  90 tablet    Refill:  1  . metFORMIN (GLUCOPHAGE) 1000 MG tablet    Sig: TAKE 1 TABLET TWICE DAILY WITH A MEAL    Dispense:  180 tablet    Refill:  1   Patient Instructions   No medication changes for now.  If any concerns on labs I will let you know.  If you do need to take colchicine for a gout flare, do not take atorvastatin for a few days.  Recheck 6 months.  Return to the clinic or go to the nearest emergency room if any of your symptoms worsen or new symptoms occur.   If you have lab work done today you will be contacted with your lab results within the next 2 weeks.  If you have not heard from [place] then please contact us. The fastest way to get your results is to register for My Chart.   IF you received an x-ray today, you will receive an invoice from [facility]. Please contact [facility] at [phone] with questions or concerns regarding your invoice.   IF you received labwork today, you will receive an invoice from [place]. Please contact [facility] at [phone] with questions or concerns regarding your invoice.   Our billing staff will not be able to  assist you with questions regarding bills from these companies.  You will be contacted with the lab results as soon as they are available. The fastest way to get your  results is to activate your My Chart account. Instructions are located on the last page of this paperwork. If you have not heard from [place] regarding the results in 2 weeks, please contact this office.         Signed, Merri Ray, MD [facility]

## 2020-06-05 NOTE — Patient Instructions (Addendum)
No medication changes for now.  If any concerns on labs I will let you know.  If you do need to take colchicine for a gout flare, do not take atorvastatin for a few days.  Recheck 6 months.  Return to the clinic or go to the nearest emergency room if any of your symptoms worsen or new symptoms occur.   If you have lab work done today you will be contacted with your lab results within the next 2 weeks.  If you have not heard from [place] then please contact us. The fastest way to get your results is to register for My Chart.   IF you received an x-ray today, you will receive an invoice from [facility]. Please contact [facility] at [phone] with questions or concerns regarding your invoice.   IF you received labwork today, you will receive an invoice from [place]. Please contact [facility] at [phone] with questions or concerns regarding your invoice.   Our billing staff will not be able to assist you with questions regarding bills from these companies.  You will be contacted with the lab results as soon as they are available. The fastest way to get your results is to activate your My Chart account. Instructions are located on the last page of this paperwork. If you have not heard from [place] regarding the results in 2 weeks, please contact this office.

## 2020-06-06 LAB — COMPREHENSIVE METABOLIC PANEL
ALT: 13 IU/L (ref 0–44)
AST: 19 IU/L (ref 0–40)
Albumin/Globulin Ratio: 1.9 (ref 1.2–2.2)
Albumin: 4.7 g/dL (ref 3.7–4.7)
Alkaline Phosphatase: 61 IU/L (ref 48–121)
BUN/Creatinine Ratio: 21 (ref 10–24)
BUN: 24 mg/dL (ref 8–27)
Bilirubin Total: 0.3 mg/dL (ref 0.0–1.2)
CO2: 21 mmol/L (ref 20–29)
Calcium: 9.6 mg/dL (ref 8.6–10.2)
Chloride: 103 mmol/L (ref 96–106)
Creatinine, Ser: 1.17 mg/dL (ref 0.76–1.27)
GFR calc Af Amer: 71 mL/min/{1.73_m2} (ref >59)
GFR calc non Af Amer: 61 mL/min/{1.73_m2} (ref >59)
Globulin, Total: 2.5 g/dL (ref 1.5–4.5)
Glucose: 116 mg/dL — ABNORMAL HIGH (ref 65–99)
Potassium: 4.8 mmol/L (ref 3.5–5.2)
Sodium: 140 mmol/L (ref 134–144)
Total Protein: 7.2 g/dL (ref 6.0–8.5)

## 2020-06-06 LAB — LIPID PANEL
Chol/HDL Ratio: 3.6 ratio (ref 0.0–5.0)
Cholesterol, Total: 134 mg/dL (ref 100–199)
HDL: 37 mg/dL — ABNORMAL LOW (ref >39)
LDL Chol Calc (NIH): 85 mg/dL (ref 0–99)
Triglycerides: 58 mg/dL (ref 0–149)
VLDL Cholesterol Cal: 12 mg/dL (ref 5–40)

## 2020-06-06 LAB — HEMOGLOBIN A1C
Est. average glucose Bld gHb Est-mCnc: 157 mg/dL
Hgb A1c MFr Bld: 7.1 % — ABNORMAL HIGH (ref 4.8–5.6)

## 2020-08-15 DIAGNOSIS — H40013 Open angle with borderline findings, low risk, bilateral: Secondary | ICD-10-CM | POA: Diagnosis not present

## 2020-08-15 DIAGNOSIS — Z961 Presence of intraocular lens: Secondary | ICD-10-CM | POA: Diagnosis not present

## 2020-08-15 DIAGNOSIS — E119 Type 2 diabetes mellitus without complications: Secondary | ICD-10-CM | POA: Diagnosis not present

## 2020-08-15 DIAGNOSIS — H25812 Combined forms of age-related cataract, left eye: Secondary | ICD-10-CM | POA: Diagnosis not present

## 2020-08-15 LAB — HM DIABETES EYE EXAM

## 2020-09-04 ENCOUNTER — Ambulatory Visit: Payer: Medicare HMO | Admitting: Neurology

## 2020-09-04 ENCOUNTER — Other Ambulatory Visit: Payer: Self-pay

## 2020-09-04 ENCOUNTER — Encounter: Payer: Self-pay | Admitting: Neurology

## 2020-09-04 VITALS — BP 142/78 | HR 96 | Ht 66.0 in | Wt 203.0 lb

## 2020-09-04 DIAGNOSIS — Z9989 Dependence on other enabling machines and devices: Secondary | ICD-10-CM | POA: Diagnosis not present

## 2020-09-04 DIAGNOSIS — G4733 Obstructive sleep apnea (adult) (pediatric): Secondary | ICD-10-CM | POA: Diagnosis not present

## 2020-09-04 NOTE — Progress Notes (Signed)
Order for cpap supplies sent to [facility] via fax. Confirmation received that the order transmitted was successful.

## 2020-09-04 NOTE — Progress Notes (Signed)
Subjective:    Patient ID: Derrick Becker is a 74 y.o. male.  HPI     Interim history:   Derrick Becker is a 74 year old right-handed gentleman with an underlying medical history of type 2 diabetes, hypertension, hyperlipidemia and obesity, who presents for follow up consultation of his obstructive sleep apnea, on AutoPap therapy. The patient is unaccompanied today. I last saw him on 09/07/19, at which time he was compliant with his CPAP.  Today, 09/04/20: I reviewed his AutoPap compliance data from 08/04/2020 through 09/02/2020, which is a total of 30 days, during which time he used his machine 26 days with percent use days greater than 4 hours at 83%, indicating very good compliance, average usage of 5 hours and 52 minutes, residual AHI at goal at 0.5/h, leak on the higher end with a 95th percentile at 21 L/min on a pressure range of 5 to 15 cm with EPR, 95th percentile of pressure at 9.2 cm. He reports that he had to weekend trips where he did not use his CPAP.  He is up-to-date with his supplies but is due for new shipment and his DME company is [employer].  He is doing well medically speaking, no new issues, no new medications.  His weight tends to fluctuate, little bit higher in the wintertime when he is less physically active.  He has no new concerns.     The patient's allergies, current medications, family history, past medical history, past social history, past surgical history and problem list were reviewed and updated as appropriate.    Previously (copied from previous notes for reference):   I saw him on 04/12/2018, at which time he was compliant with his AutoPap.  He did better with AutoPap compared to CPAP.  He was doing well.  He had lost a little bit of weight, his A1c was a little better as well.  He was advised to follow-up routinely in 1 year.    I reviewed his AutoPap compliance data from 08/07/2019 through 09/05/2019 which is a total of 30 days, during which time he used his AutoPap 25  days with percent use days greater than 4 hours at 83%, indicating very good compliance with an average usage of 6 hours and 3 minutes, residual AHI at goal at 0.6/h, 95th percentile of pressure at 8.6 cm with a range of 5 cm to 15 cm with EPR.  Leak on the higher acceptable range, with a 95th percentile at 15 L/min.     I saw him on 01/25/2017, at which time he was doing well. He had an overnight pulse oximetry test in November 2017 which showed benign results on AutoPap therapy.   I reviewed his AutoPap compliance data from 03/12/2018 through 04/10/2018 which is a total of 30 days, during which time he used his AutoPap 22 days with percent used days greater than 4 hours at 67%, indicating slightly suboptimal compliance with an average usage of 4 hours and 52 minutes, residual AHI at goal at 0.7 per hour, 95th percentile of pressure at 8.1 cm, leak acceptable with the 95th percentile at 8.7 L/m on a pressure range of 5-15 cm with EPR.    I saw him on 07/27/2016, at which time he reported doing well on AutoPap therapy. He felt better. He had very good compliance with AutoPap. He had not had his also oximetry test yet. I suggested he continue with AutoPap therapy and we order a pulse ox oximetry test overnight.   I reviewed his  AutoPap compliance data from 12/21/2016 through 01/19/2017, which is a total of 30 days, during which time he used his CPAP only 19 days with percent used days greater than 4 hours at 57%, indicating suboptimal compliance with an average usage of 4 hours and 32 minutes, residual AHI 0.6 per hour, 95th percentile pressure of 9.4 cm, leak acceptable for the 95th percentile at 14.3 L/m, range of 5-15 cm with EPR. He had an overnight pulse oximetry test on 08/05/2016 which I reviewed: Average oxygen saturation was 95%, nadir was 88%. Time below 88% saturation was 0 minutes.    I saw him on 01/22/2016, at which time he was compliant with AutoPap. Unfortunately, his overnight pulse  oximetry test had not been done. We re-requested that from his DME. He reported doing well, he was adjusting well to treatment, he felt better rested with AutoPap therapy and requested to try a chinstrap because of occasional mouth opening and mouth dryness. Otherwise he was doing rather well. I suggested we proceed with overnight pulse oximetry testing while he is on AutoPap therapy to ensure proper oxygen saturations while on treatment. Unfortunately, this has yet to be done. We called his DME company on 07/23/2016 and they had not done the ONO yet.    I reviewed his AutoPap compliance data from 06/23/2016 through 07/22/2016 which is a total of 30 days, during which time he used his machine 27 days with percent used days greater than 4 hours at 83%, indicating very good compliance with an average usage for all days of 4 hours and 28 minutes, residual AHI 1 per hour, 95th percentile of pressure at 9.3 cm, and leak acceptable for the 95th percentile at 14.8 L/m, range of 5 cm to 15 cm with EPR.   I first met him on 07/02/2015 at the request of his primary care physician, at which time the patient reported snoring, excessive daytime somnolence, sleep disruption, difficulty going to sleep and maintaining sleep. The patient was invited back for sleep study. His insurance denied an in-house sleep study. He had a home sleep test on 08/15/2015, which showed a total AHI of 42.9 per hour, oxycodone desaturation nadir of 60%, time below 88% saturation was over 4 hours in keeping with significant nocturnal hypoxemia. Is insurance unfortunately also denied an in-house CPAP titration study which I requested. I placed him on AutoPap therapy. I also suggested we check an overnight pulse oximetry while he is on AutoPap therapy after he is established on treatment. This did not get done.   I reviewed his AutoPap compliance data from 12/22/2015 through 01/20/2016 which is a total of 30 days during which time he used his  machine 28 days with percent used days greater than 4 hours at 87%, indicating very good compliance with an average usage of fibers and 1 minute, residual AHI 0.9 per hour, 95th percentile pressure at 9.8 cm, leak acceptable with the 95th percentile at 16.6 L/m, pressure range of 5-15 cm with EPR.   07/02/2015: He reports snoring and excessive daytime somnolence with nighttime sleep disruption including difficulty maintaining sleep and difficulty going to sleep. His ESS is 16/24 and his fatigue score is 43/63. He has has at times loud snoring. Symptoms have been ongoing for months to perhaps use. He does not wake up rested. He denies morning headaches and has nocturia occasionally. He denies frank restless leg symptoms and does not know if he twitches his legs in his sleep. He lives alone. He is divorced. He  has 2 grown sons. He works full-time at a Education [employer], [employer], 6 AM to 2:30 PM, Monday through Friday. He does not smoke. He drinks alcohol occasionally but has had heavier drinking in the past. He drinks coffee one cup per day and typically no additional caffeine, occasional sodas. Has not lost any significant amount of weight. He typically stays above the 200 pound mark and fluctuates a few pounds at a time. His brother has obstructive sleep apnea and uses a CPAP machine. This year, his son is also been diagnosed with OSA and has a CPAP machine now. His bedtime is usually around 10 or 10:30 and sometimes he has trouble going to sleep. He has never taken a sleeping pill or over-the-counter sleep aid. His rise time is typically around 4:30 AM. I reviewed your office note from 05/30/2015.   His Past Medical History Is Significant For: Past Medical History:  Diagnosis Date  . Diabetes mellitus without complication ([place])   . Hyperlipemia   . Hypertension     His Past Surgical History Is Significant For: Past Surgical History:  Procedure Laterality Date  . FRACTURE SURGERY     R forearm    His Family  History Is Significant For: Family History  Problem Relation Age of Onset  . Lung cancer Father   . Kidney cancer Brother     His Social History Is Significant For: Social History   Socioeconomic History  . Marital status: Divorced    Spouse name: Not on file  . Number of children: 2  . Years of education: College  . Highest education level: Not on file  Occupational History  . Not on file  Tobacco Use  . Smoking status: Never Smoker  . Smokeless tobacco: Never Used  Substance and Sexual Activity  . Alcohol use: Yes    Alcohol/week: 0.0 standard drinks  . Drug use: No  . Sexual activity: Never    Birth control/protection: Abstinence  Other Topics Concern  . Not on file  Social History Narrative  . Not on file   Social Determinants of Health   Financial Resource Strain:   . Difficulty of Paying Living Expenses: Not on file  Food Insecurity:   . Worried About Charity fundraiser in the Last Year: Not on file  . Ran Out of Food in the Last Year: Not on file  Transportation Needs:   . Lack of Transportation (Medical): Not on file  . Lack of Transportation (Non-Medical): Not on file  Physical Activity:   . Days of Exercise per Week: Not on file  . Minutes of Exercise per Session: Not on file  Stress:   . Feeling of Stress : Not on file  Social Connections:   . Frequency of Communication with Friends and Family: Not on file  . Frequency of Social Gatherings with Friends and Family: Not on file  . Attends Religious Services: Not on file  . Active Member of Clubs or Organizations: Not on file  . Attends Archivist Meetings: Not on file  . Marital Status: Not on file    His Allergies Are:  No Known Allergies:   His Current Medications Are:  Outpatient Encounter Medications as of 09/04/2020  Medication Sig  . allopurinol (ZYLOPRIM) 100 MG tablet TAKE 1 TABLET EVERY DAY  . amLODipine (NORVASC) 10 MG tablet Take 1 tablet (10 mg total) by mouth daily.  Marland Kitchen  aspirin EC 81 MG tablet Take 81 mg by mouth daily.  Marland Kitchen atorvastatin (  LIPITOR) 80 MG tablet TAKE 1 TABLET EVERY DAY  . colchicine 0.6 MG tablet 2 tablets once by mouth if gout flare, 1 additional tablet in 1 hour if not improved.  Marland Kitchen glipiZIDE (GLUCOTROL) 10 MG tablet TAKE ONE TABLET IN THE MORNING WITH MORNING MEAL AND 1 TABLET WITH EVENING MEAL.  Marland Kitchen losartan-hydrochlorothiazide (HYZAAR) 100-25 MG tablet Take 1 tablet by mouth daily.  . metFORMIN (GLUCOPHAGE) 1000 MG tablet TAKE 1 TABLET TWICE DAILY WITH A MEAL  . Multiple Vitamins-Minerals (ONE DAILY MENS PO)    No facility-administered encounter medications on file as of 09/04/2020.  :  Review of Systems:  Out of a complete 14 point review of systems, all are reviewed and negative with the exception of these symptoms as listed below: Review of Systems  Neurological:       Pt presents today to discuss his cpap. Pt reports that his cpap is doing well. He needs new supplies.    Objective:  Neurological Exam  Physical Exam Physical Examination:   Vitals:   09/04/20 1432  BP: (!) 142/78  Pulse: 96   General Examination: The patient is a very pleasant 74 y.o. male in no acute distress. He appears well-developed and well-nourished and well groomed.   HEENT:Normocephalic, atraumatic, pupils are equal, round and reactive to light, extraocular tracking is good without limitation to gaze excursion or nystagmus noted. Normal smooth pursuit is noted. Hearing is grossly intact. Face is symmetric with normal facial animation. Speech is clear with no dysarthria noted. There is no hypophonia. There is no lip, neck/head, jaw or voice tremor. Neck is supple with full range of passive and active motion. Oropharynx exam reveals: mildmouth dryness, adequatedental hygiene and moderateairway crowding.   Chest:Clear to auscultation without wheezing, rhonchi or crackles noted.  Heart:S1+S2+0, regular and normal without murmurs, rubs or gallops noted.    Abdomen:Soft, non-tender and non-distended with normal bowel sounds appreciated on auscultation.  Extremities:There isnopitting edema in the distal lower extremities bilaterally.   Skin: Warm and dry without trophic changes noted.  Musculoskeletal: exam reveals no obvious joint deformities, tenderness or joint swelling or erythema.  Neurologically:  Mental status: The patient is awake, alert and oriented in all 4 spheres.Hisimmediate and remote memory, attention, language skills and fund of knowledge are appropriate. There is no evidence of aphasia, agnosia, apraxia or anomia. Speech is clear with normal prosody and enunciation. Thought process is linear. Mood is normaland affect is normal.  Cranial nerves II - XII are as described above under HEENT exam.  Motor exam: Normal bulk, strength and tone is noted. There is no tremor. Fine motor skills and coordination: intact. Cerebellar testing: No dysmetria or intention tremor. There is no truncal or gait ataxia.  Sensory exam: intact to light touch in the upper and lower extremities.  Gait, station and balance:Hestands easily. No veering to one side is noted. No leaning to one side is noted. Posture is age-appropriate and stance is narrow based. Gait showsnormalstride length and normalpace. No problems turning are noted.  Assessmentand plan:  In summary,Derrick C Robbinsis a very pleasant 37 year oldmalewith an underlying medical history of type 2 diabetes, hypertension, hyperlipidemia and obesity, whopresents for follow-up consultation of his severe obstructive sleep apnea as determined by a HST on 08/15/2015. Unfortunately, he did not have an attended sleep study or CPAP titration study. He has been on AutoPap therapy and has been compliant with it.last year we tried him on CPAP therapy of 10 cm but he could not tolerate  it. He stopped using his CPAP but was able to resume AutoPap therapy with success, he is compliant  with treatment. He is commended for his treatment adherence.  He is advised to be consistent with his AutoPap therapy and follow-up routinely in 1 year, I placed an order for his supplies. He had an overnight pulse oximetry test in November 2017 with benign results while on AutoPap therapy.  He is advised to follow-up to see one of our nurse practitioners next year.  I answered all his questions today and he was in agreement. I spent 20 minutes in total face-to-face time and in reviewing records during pre-charting, more than 50% of which was spent in counseling and coordination of care, reviewing test results, reviewing medications and treatment regimen and/or in discussing or reviewing the diagnosis of OSA, the prognosis and treatment options. Pertinent laboratory and imaging test results that were available during this visit with the patient were reviewed by me and considered in my medical decision making (see chart for details).

## 2020-09-04 NOTE — Patient Instructions (Signed)

## 2020-09-09 ENCOUNTER — Ambulatory Visit: Payer: Medicare HMO | Admitting: Neurology

## 2020-09-17 DIAGNOSIS — G4733 Obstructive sleep apnea (adult) (pediatric): Secondary | ICD-10-CM | POA: Diagnosis not present

## 2020-10-23 ENCOUNTER — Other Ambulatory Visit: Payer: Self-pay | Admitting: Family Medicine

## 2020-10-23 DIAGNOSIS — M1A0790 Idiopathic chronic gout, unspecified ankle and foot, without tophus (tophi): Secondary | ICD-10-CM

## 2020-10-23 DIAGNOSIS — E785 Hyperlipidemia, unspecified: Secondary | ICD-10-CM

## 2020-11-06 ENCOUNTER — Other Ambulatory Visit: Payer: Self-pay | Admitting: Family Medicine

## 2020-11-06 DIAGNOSIS — I10 Essential (primary) hypertension: Secondary | ICD-10-CM

## 2020-11-06 DIAGNOSIS — E1165 Type 2 diabetes mellitus with hyperglycemia: Secondary | ICD-10-CM

## 2020-12-04 ENCOUNTER — Ambulatory Visit (INDEPENDENT_AMBULATORY_CARE_PROVIDER_SITE_OTHER): Payer: Medicare HMO | Admitting: Family Medicine

## 2020-12-04 ENCOUNTER — Encounter: Payer: Self-pay | Admitting: Family Medicine

## 2020-12-04 ENCOUNTER — Other Ambulatory Visit: Payer: Self-pay

## 2020-12-04 VITALS — BP 132/68 | HR 80 | Temp 98.3°F | Ht 66.0 in | Wt 202.0 lb

## 2020-12-04 DIAGNOSIS — M109 Gout, unspecified: Secondary | ICD-10-CM

## 2020-12-04 DIAGNOSIS — E785 Hyperlipidemia, unspecified: Secondary | ICD-10-CM

## 2020-12-04 DIAGNOSIS — I10 Essential (primary) hypertension: Secondary | ICD-10-CM

## 2020-12-04 DIAGNOSIS — E1165 Type 2 diabetes mellitus with hyperglycemia: Secondary | ICD-10-CM | POA: Diagnosis not present

## 2020-12-04 DIAGNOSIS — M1A0790 Idiopathic chronic gout, unspecified ankle and foot, without tophus (tophi): Secondary | ICD-10-CM

## 2020-12-04 MED ORDER — AMLODIPINE BESYLATE 10 MG PO TABS: 10.0000 mg | ORAL_TABLET | Freq: Every day | ORAL | 1 refills | Status: DC

## 2020-12-04 MED ORDER — ATORVASTATIN CALCIUM 80 MG PO TABS: 80.0000 mg | ORAL_TABLET | Freq: Every day | ORAL | 1 refills | Status: DC

## 2020-12-04 MED ORDER — LOSARTAN POTASSIUM-HCTZ 100-25 MG PO TABS: 1.0000 | ORAL_TABLET | Freq: Every day | ORAL | 1 refills | Status: DC

## 2020-12-04 MED ORDER — METFORMIN HCL 1000 MG PO TABS: ORAL_TABLET | ORAL | 1 refills | Status: DC

## 2020-12-04 MED ORDER — ALLOPURINOL 100 MG PO TABS: 100.0000 mg | ORAL_TABLET | Freq: Every day | ORAL | 1 refills | Status: DC

## 2020-12-04 MED ORDER — GLIPIZIDE 10 MG PO TABS
ORAL_TABLET | ORAL | 1 refills | Status: DC
Start: 2020-12-04 — End: 2021-04-29

## 2020-12-04 NOTE — Progress Notes (Signed)
Subjective:  Patient ID: Derrick Becker, male    DOB: Mar 31, 1946  Age: 75 y.o. MRN: [id number]  CC:  Chief Complaint  Patient presents with  . Follow-up    On diabetes,hypertension, and hyperglycemia. PT reports this mornings fasting BS was 129 mg/dL and this is the average lately. Pt reports his BP has been running around 112/61. Pt reports no issues with current medication.    HPI Derrick Becker presents for   Diabetes: Complicated by hyperglycemia.  Metformin 1000 mg twice daily, Glucotrol 10mg  BID.   Home readings running a little higher. Less walking, travel back to [place] and diet indiscretion. Few readings in 200's. Plans improvements - will be home for awhile. Brother in Sports coach passed in December. He is on statin and ARB.  No symptomatic lows.lowest 80.  Microalbumin: Normal ratio 08/25/2019 Optho, foot exam, pneumovax:  Foot exam today Diabetic Foot Exam - Simple   Simple Foot Form Diabetic Foot exam was performed with the following findings: Yes 12/04/2020  8:42 AM  Visual Inspection No deformities, no ulcerations, no other skin breakdown bilaterally: Yes Sensation Testing Intact to touch and monofilament testing bilaterally: Yes Pulse Check Posterior Tibialis and Dorsalis pulse intact bilaterally: Yes Comments Normal fndings    Lab Results  Component Value Date   HGBA1C 7.1 (H) 06/05/2020   HGBA1C 7.1 (H) 12/04/2019   HGBA1C 7.5 (H) 08/28/2019   Lab Results  Component Value Date   MICROALBUR 18.2 (H) 12/22/2014   LDLCALC 85 06/05/2020   CREATININE 1.17 06/05/2020   Hyperlipidemia: Lipitor 80 mg daily. No new myalgias.  Lab Results  Component Value Date   CHOL 134 06/05/2020   HDL 37 (L) 06/05/2020   LDLCALC 85 06/05/2020   TRIG 58 06/05/2020   CHOLHDL 3.6 06/05/2020   Lab Results  Component Value Date   ALT 13 06/05/2020   AST 19 06/05/2020   ALKPHOS 61 06/05/2020   BILITOT 0.3 06/05/2020    Hypertension: With OSA on CPAP.  Losartan HCTZ  100/25 mg daily, amlodipine 10 mg daily. Home readings: 109/61. Usually 120/60-70.  On ASA QD. No bleeding. R/b of use discussed.  BP Readings from Last 3 Encounters:  12/04/20 132/68  09/04/20 (!) 142/78  06/05/20 127/67   Lab Results  Component Value Date   CREATININE 1.17 06/05/2020   Gout: Last flare: December, no recent prior to that flare, colchicine resolved quickly - 3 days.  Daily meds:allopurinol 100mg  qd.  Prn med: colchicine.  Lab Results  Component Value Date   LABURIC 8.0 12/04/2019    HM:  Had covid vaccine and booster, and flu shot at pharmacy.    History Patient Active Problem List   Diagnosis Date Noted  . Hyperlipidemia 10/12/2013  . Diabetes ([place]) 08/23/2012  . HTN (hypertension) 08/23/2012   Past Medical History:  Diagnosis Date  . Diabetes mellitus without complication ([place])   . Hyperlipemia   . Hypertension    Past Surgical History:  Procedure Laterality Date  . FRACTURE SURGERY     R forearm   No Known Allergies Prior to Admission medications   Medication Sig Start Date End Date Taking? Authorizing Provider  allopurinol (ZYLOPRIM) 100 MG tablet TAKE 1 TABLET EVERY DAY 10/23/20  Yes [name], MD  amLODipine (NORVASC) 10 MG tablet TAKE 1 TABLET EVERY DAY 11/06/20  Yes [name], MD  aspirin EC 81 MG tablet Take 81 mg by mouth daily.   Yes [provider]  atorvastatin (LIPITOR)  80 MG tablet TAKE 1 TABLET EVERY DAY 10/23/20  Yes [name], MD  colchicine 0.6 MG tablet 2 tablets once by mouth if gout flare, 1 additional tablet in 1 hour if not improved. 06/05/20  Yes [name], MD  glipiZIDE (GLUCOTROL) 10 MG tablet TAKE ONE TABLET IN THE MORNING WITH MORNING MEAL AND 1 TABLET WITH EVENING MEAL. 06/05/20  Yes [name], MD  losartan-hydrochlorothiazide [facility]) 100-25 MG tablet TAKE 1 TABLET EVERY DAY 11/06/20  Yes [name], MD  metFORMIN (GLUCOPHAGE) 1000 MG tablet TAKE 1 TABLET TWICE DAILY WITH  MEALS 11/06/20  Yes [name], MD  Multiple Vitamins-Minerals (ONE DAILY MENS PO)  03/02/18  Yes [provider]   Social History   Socioeconomic History  . Marital status: Divorced    Spouse name: Not on file  . Number of children: 2  . Years of education: College  . Highest education level: Not on file  Occupational History  . Not on file  Tobacco Use  . Smoking status: Never Smoker  . Smokeless tobacco: Never Used  Substance and Sexual Activity  . Alcohol use: Yes    Alcohol/week: 0.0 standard drinks  . Drug use: No  . Sexual activity: Never    Birth control/protection: Abstinence  Other Topics Concern  . Not on file  Social History Narrative  . Not on file   Social Determinants of Health   Financial Resource Strain: Not on file  Food Insecurity: Not on file  Transportation Needs: Not on file  Physical Activity: Not on file  Stress: Not on file  Social Connections: Not on file  Intimate Partner Violence: Not on file    Review of Systems  Constitutional: Negative for fatigue and unexpected weight change.  Eyes: Negative for visual disturbance.  Respiratory: Negative for cough, chest tightness and shortness of breath.   Cardiovascular: Negative for chest pain, palpitations and leg swelling.  Gastrointestinal: Negative for abdominal pain and blood in stool.  Neurological: Negative for dizziness, light-headedness and headaches.     Objective:   Vitals:   12/04/20 0810  BP: 132/68  Pulse: 80  Temp: 98.3 F (36.8 C)  TempSrc: Temporal  SpO2: 96%  Weight: 202 lb (91.6 kg)  Height: 5\' 6"  (1.676 m)     Physical Exam Vitals reviewed.  Constitutional:      Appearance: He is well-developed and well-nourished.  HENT:     Head: Normocephalic and atraumatic.  Eyes:     Extraocular Movements: EOM normal.     Pupils: Pupils are equal, round, and reactive to light.  Neck:     Vascular: No carotid bruit or JVD.  Cardiovascular:     Rate and  Rhythm: Normal rate and regular rhythm.     Heart sounds: Normal heart sounds. No murmur heard.   Pulmonary:     Effort: Pulmonary effort is normal.     Breath sounds: Normal breath sounds. No rales.  Musculoskeletal:        General: No edema.  Skin:    General: Skin is warm and dry.  Neurological:     Mental Status: He is alert and oriented to person, place, and time.  Psychiatric:        Mood and Affect: Mood and affect normal.        Assessment & Plan:  Derrick Becker is a 75 y.o. male . Type 2 diabetes mellitus with hyperglycemia, without long-term current use of insulin (HCC) -  Plan: Microalbumin / creatinine urine ratio, Hemoglobin A1c, HM Diabetes Foot Exam, glipiZIDE (GLUCOTROL) 10 MG tablet, metFORMIN (GLUCOPHAGE) 1000 MG tablet  -Check A1c, continue same meds.  Check microalbumin.  Possible higher readings recently but plans on increased exercise/diet adherence.  No med changes for now  Hyperlipidemia, unspecified hyperlipidemia type - Plan: Comprehensive metabolic panel, Lipid panel, atorvastatin (LIPITOR) 80 MG tablet  -  Stable, tolerating current regimen. Medications refilled. Labs pending as above.   Essential hypertension - Plan: amLODipine (NORVASC) 10 MG tablet, losartan-hydrochlorothiazide (HYZAAR) 100-25 MG tablet  -  Stable, tolerating current regimen. Medications refilled. Labs pending as above.   Gout, unspecified cause, unspecified chronicity, unspecified site  -Small flare recently, continue same regimen.  RTC precautions if frequent flares Chronic gout of ankle, unspecified cause, unspecified laterality - Plan: allopurinol (ZYLOPRIM) 100 MG tablet   Meds ordered this encounter  Medications  . allopurinol (ZYLOPRIM) 100 MG tablet    Sig: Take 1 tablet (100 mg total) by mouth daily.    Dispense:  90 tablet    Refill:  1  . amLODipine (NORVASC) 10 MG tablet    Sig: Take 1 tablet (10 mg total) by mouth daily.    Dispense:  90 tablet    Refill:  1   . atorvastatin (LIPITOR) 80 MG tablet    Sig: Take 1 tablet (80 mg total) by mouth daily.    Dispense:  90 tablet    Refill:  1  . glipiZIDE (GLUCOTROL) 10 MG tablet    Sig: TAKE ONE TABLET IN THE MORNING WITH MORNING MEAL AND 1 TABLET WITH EVENING MEAL.    Dispense:  180 tablet    Refill:  1  . losartan-hydrochlorothiazide (HYZAAR) 100-25 MG tablet    Sig: Take 1 tablet by mouth daily.    Dispense:  90 tablet    Refill:  1  . metFORMIN (GLUCOPHAGE) 1000 MG tablet    Sig: TAKE 1 TABLET TWICE DAILY WITH MEALS    Dispense:  180 tablet    Refill:  1   Patient Instructions   No med changes for now.  Improved diet/activity will likely help blood sugar readings.  Recheck in 6 months.  Let me know if there are questions sooner.    If you have lab work done today you will be contacted with your lab results within the next 2 weeks.  If you have not heard from [place] then please contact us. The fastest way to get your results is to register for My Chart.   IF you received an x-ray today, you will receive an invoice from [facility]. Please contact [facility] at [phone] with questions or concerns regarding your invoice.   IF you received labwork today, you will receive an invoice from [facility]. Please contact [facility] at [phone] with questions or concerns regarding your invoice.   Our billing staff will not be able to assist you with questions regarding bills from these companies.  You will be contacted with the lab results as soon as they are available. The fastest way to get your results is to activate your My Chart account. Instructions are located on the last page of this paperwork. If you have not heard from [place] regarding the results in 2 weeks, please contact this office.         Signed, Merri Ray, MD [facility]

## 2020-12-04 NOTE — Patient Instructions (Addendum)
No med changes for now.  Improved diet/activity will likely help blood sugar readings.  Recheck in 6 months.  Let me know if there are questions sooner.    If you have lab work done today you will be contacted with your lab results within the next 2 weeks.  If you have not heard from [place] then please contact us. The fastest way to get your results is to register for My Chart.   IF you received an x-ray today, you will receive an invoice from [facility]. Please contact [facility] at [phone] with questions or concerns regarding your invoice.   IF you received labwork today, you will receive an invoice from [facility]. Please contact [facility] at [phone] with questions or concerns regarding your invoice.   Our billing staff will not be able to assist you with questions regarding bills from these companies.  You will be contacted with the lab results as soon as they are available. The fastest way to get your results is to activate your My Chart account. Instructions are located on the last page of this paperwork. If you have not heard from [place] regarding the results in 2 weeks, please contact this office.

## 2020-12-05 LAB — MICROALBUMIN / CREATININE URINE RATIO
Creatinine, Urine: 117.9 mg/dL
Microalb/Creat Ratio: 17 mg/g creat (ref 0–29)
Microalbumin, Urine: 19.6 ug/mL

## 2020-12-05 LAB — COMPREHENSIVE METABOLIC PANEL
ALT: 21 IU/L (ref 0–44)
AST: 21 IU/L (ref 0–40)
Albumin/Globulin Ratio: 2 (ref 1.2–2.2)
Albumin: 4.9 g/dL — ABNORMAL HIGH (ref 3.7–4.7)
Alkaline Phosphatase: 76 IU/L (ref 44–121)
BUN/Creatinine Ratio: 19 (ref 10–24)
BUN: 22 mg/dL (ref 8–27)
Bilirubin Total: 0.3 mg/dL (ref 0.0–1.2)
CO2: 19 mmol/L — ABNORMAL LOW (ref 20–29)
Calcium: 9.1 mg/dL (ref 8.6–10.2)
Chloride: 102 mmol/L (ref 96–106)
Creatinine, Ser: 1.13 mg/dL (ref 0.76–1.27)
Globulin, Total: 2.5 g/dL (ref 1.5–4.5)
Glucose: 156 mg/dL — ABNORMAL HIGH (ref 65–99)
Potassium: 4.6 mmol/L (ref 3.5–5.2)
Sodium: 140 mmol/L (ref 134–144)
Total Protein: 7.4 g/dL (ref 6.0–8.5)
eGFR: 68 mL/min/{1.73_m2} (ref >59)

## 2020-12-05 LAB — LIPID PANEL
Chol/HDL Ratio: 4.2 ratio (ref 0.0–5.0)
Cholesterol, Total: 154 mg/dL (ref 100–199)
HDL: 37 mg/dL — ABNORMAL LOW (ref >39)
LDL Chol Calc (NIH): 100 mg/dL — ABNORMAL HIGH (ref 0–99)
Triglycerides: 90 mg/dL (ref 0–149)
VLDL Cholesterol Cal: 17 mg/dL (ref 5–40)

## 2020-12-05 LAB — HEMOGLOBIN A1C
Est. average glucose Bld gHb Est-mCnc: 171 mg/dL
Hgb A1c MFr Bld: 7.6 % — ABNORMAL HIGH (ref 4.8–5.6)

## 2021-02-11 DIAGNOSIS — H25812 Combined forms of age-related cataract, left eye: Secondary | ICD-10-CM | POA: Diagnosis not present

## 2021-02-11 DIAGNOSIS — H40013 Open angle with borderline findings, low risk, bilateral: Secondary | ICD-10-CM | POA: Diagnosis not present

## 2021-02-11 DIAGNOSIS — Z961 Presence of intraocular lens: Secondary | ICD-10-CM | POA: Diagnosis not present

## 2021-03-18 DIAGNOSIS — G4733 Obstructive sleep apnea (adult) (pediatric): Secondary | ICD-10-CM | POA: Diagnosis not present

## 2021-04-21 ENCOUNTER — Other Ambulatory Visit: Payer: Self-pay | Admitting: Family Medicine

## 2021-04-21 DIAGNOSIS — E1165 Type 2 diabetes mellitus with hyperglycemia: Secondary | ICD-10-CM

## 2021-04-29 ENCOUNTER — Telehealth: Payer: Self-pay

## 2021-04-29 ENCOUNTER — Other Ambulatory Visit: Payer: Self-pay

## 2021-04-29 DIAGNOSIS — E1165 Type 2 diabetes mellitus with hyperglycemia: Secondary | ICD-10-CM

## 2021-04-29 MED ORDER — GLIPIZIDE 10 MG PO TABS: ORAL_TABLET | ORAL | 1 refills | Status: DC

## 2021-04-29 NOTE — Telephone Encounter (Signed)
Pt needs refill on glipiZIDE (GLUCOTROL) 10 MG tablet  [facility] ([facility] - [place], [place]   Pt call back [phone]

## 2021-04-29 NOTE — Telephone Encounter (Signed)
Medication sent to pharmacy  

## 2021-06-05 ENCOUNTER — Other Ambulatory Visit: Payer: Self-pay | Admitting: Family Medicine

## 2021-06-05 DIAGNOSIS — E1165 Type 2 diabetes mellitus with hyperglycemia: Secondary | ICD-10-CM

## 2021-06-05 DIAGNOSIS — E785 Hyperlipidemia, unspecified: Secondary | ICD-10-CM

## 2021-06-05 DIAGNOSIS — M1A0790 Idiopathic chronic gout, unspecified ankle and foot, without tophus (tophi): Secondary | ICD-10-CM

## 2021-06-05 DIAGNOSIS — I10 Essential (primary) hypertension: Secondary | ICD-10-CM

## 2021-06-06 ENCOUNTER — Ambulatory Visit: Payer: Self-pay | Admitting: Family Medicine

## 2021-06-19 ENCOUNTER — Other Ambulatory Visit: Payer: Self-pay

## 2021-06-19 ENCOUNTER — Ambulatory Visit (INDEPENDENT_AMBULATORY_CARE_PROVIDER_SITE_OTHER): Payer: Medicare HMO | Admitting: Family Medicine

## 2021-06-19 ENCOUNTER — Encounter: Payer: Self-pay | Admitting: Family Medicine

## 2021-06-19 VITALS — BP 126/60 | HR 68 | Temp 98.2°F | Resp 16 | Ht 66.0 in | Wt 189.8 lb

## 2021-06-19 DIAGNOSIS — E785 Hyperlipidemia, unspecified: Secondary | ICD-10-CM | POA: Diagnosis not present

## 2021-06-19 DIAGNOSIS — I10 Essential (primary) hypertension: Secondary | ICD-10-CM

## 2021-06-19 DIAGNOSIS — E1165 Type 2 diabetes mellitus with hyperglycemia: Secondary | ICD-10-CM

## 2021-06-19 LAB — HEMOGLOBIN A1C: Hgb A1c MFr Bld: 7.5 % — ABNORMAL HIGH (ref 4.6–6.5)

## 2021-06-19 MED ORDER — GLIPIZIDE 10 MG PO TABS: ORAL_TABLET | ORAL | 1 refills | Status: DC
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
## 2021-06-20 LAB — LIPID PANEL
Cholesterol: 150 mg/dL (ref 0–200)
HDL: 43.4 mg/dL (ref >39.00)
LDL Cholesterol: 94 mg/dL (ref 0–99)
NonHDL: 106.74
Total CHOL/HDL Ratio: 3
Triglycerides: 62 mg/dL (ref 0.0–149.0)
VLDL: 12.4 mg/dL (ref 0.0–40.0)

## 2021-06-20 LAB — COMPREHENSIVE METABOLIC PANEL
ALT: 15 U/L (ref 0–53)
AST: 20 U/L (ref 0–37)
Albumin: 4.4 g/dL (ref 3.5–5.2)
Alkaline Phosphatase: 56 U/L (ref 39–117)
BUN: 19 mg/dL (ref 6–23)
CO2: 23 mEq/L (ref 19–32)
Calcium: 9.7 mg/dL (ref 8.4–10.5)
Chloride: 106 mEq/L (ref 96–112)
Creatinine, Ser: 1.33 mg/dL (ref 0.40–1.50)
GFR: 52.3 mL/min — ABNORMAL LOW (ref >60.00)
Glucose, Bld: 132 mg/dL — ABNORMAL HIGH (ref 70–99)
Potassium: 5 mEq/L (ref 3.5–5.1)
Sodium: 136 mEq/L (ref 135–145)
Total Bilirubin: 0.5 mg/dL (ref 0.2–1.2)
Total Protein: 7.1 g/dL (ref 6.0–8.3)

## 2021-06-28 NOTE — Progress Notes (Signed)
LVM - appologized to pt for not being contacted at the agree upon time.

## 2021-08-12 DIAGNOSIS — Z961 Presence of intraocular lens: Secondary | ICD-10-CM | POA: Diagnosis not present

## 2021-08-12 DIAGNOSIS — E119 Type 2 diabetes mellitus without complications: Secondary | ICD-10-CM | POA: Diagnosis not present

## 2021-08-12 DIAGNOSIS — H40013 Open angle with borderline findings, low risk, bilateral: Secondary | ICD-10-CM | POA: Diagnosis not present

## 2021-08-12 DIAGNOSIS — H4322 Crystalline deposits in vitreous body, left eye: Secondary | ICD-10-CM | POA: Diagnosis not present

## 2021-08-12 DIAGNOSIS — H25812 Combined forms of age-related cataract, left eye: Secondary | ICD-10-CM | POA: Diagnosis not present

## 2021-08-12 LAB — HM DIABETES EYE EXAM

## 2021-09-04 ENCOUNTER — Encounter: Payer: Self-pay | Admitting: Adult Health

## 2021-09-04 ENCOUNTER — Ambulatory Visit: Payer: Medicare HMO | Admitting: Adult Health

## 2021-09-04 VITALS — BP 144/70 | HR 82 | Ht 66.0 in | Wt 194.6 lb

## 2021-09-04 DIAGNOSIS — Z9989 Dependence on other enabling machines and devices: Secondary | ICD-10-CM

## 2021-09-04 DIAGNOSIS — G4733 Obstructive sleep apnea (adult) (pediatric): Secondary | ICD-10-CM

## 2021-09-04 NOTE — Patient Instructions (Signed)
Continue using CPAP nightly and greater than 4 hours each night °If your symptoms worsen or you develop new symptoms please let us know.  ° °

## 2021-09-04 NOTE — Progress Notes (Signed)
PATIENT: Derrick Becker DOB: [birth year]  REASON FOR VISIT: follow up HISTORY FROM: patient   HISTORY OF PRESENT ILLNESS: Today 09/04/21:  Derrick Becker is a 75 year old male with a history of obstructive sleep apnea on CPAP.  He returns today for follow-up.  His download is attached to this note.  He states that overall he is doing well.  He was having problems with a dry mouth but got a chinstrap and he is found that beneficial.  He is also increased his humidification on the machine.  He returns today for an evaluation.  HISTORY 09/04/20: I reviewed his Auto[employer]ap compliance data from 08/04/2020 through 09/02/2020, which is a total of 30 days, during which time he used his machine 26 days with percent use days greater than 4 hours at 83%, indicating very good compliance, average usage of 5 hours and 52 minutes, residual AHI at goal at 0.5/h, leak on the higher end with a 95th percentile at 21 L/min on a pressure range of 5 to 15 cm with EPR, 95th percentile of pressure at 9.2 cm. He reports that he had to weekend trips where he did not use his CPAP.  He is up-to-date with his supplies but is due for new shipment and his DME company is [employer].  He is doing well medically speaking, no new issues, no new medications.  His weight tends to fluctuate, little bit higher in the wintertime when he is less physically active.  He has no new concerns.    REVIEW OF SYSTEMS: Out of a complete 14 system review of symptoms, the patient complains only of the following symptoms, and all other reviewed systems are negative.  FSS 6 ESS 5  ALLERGIES: No Known Allergies  HOME MEDICATIONS: Outpatient Medications Prior to Visit  Medication Sig Dispense Refill   allopurinol (ZYLOPRIM) 100 MG tablet TAKE 1 TABLET EVERY DAY 90 tablet 1   amLODipine (NORVASC) 10 MG tablet TAKE 1 TABLET EVERY DAY 90 tablet 1   aspirin EC 81 MG tablet Take 81 mg by mouth daily.     atorvastatin (LIPITOR) 80 MG tablet TAKE 1 TABLET  EVERY DAY 90 tablet 1   colchicine 0.6 MG tablet 2 tablets once by mouth if gout flare, 1 additional tablet in 1 hour if not improved. 10 tablet 2   glipiZIDE (GLUCOTROL) 10 MG tablet TAKE ONE TABLET IN THE MORNING WITH MORNING MEAL AND 1 TABLET WITH EVENING MEAL. 180 tablet 1   losartan-hydrochlorothiazide (HYZAAR) 100-25 MG tablet TAKE 1 TABLET EVERY DAY 90 tablet 1   metFORMIN (GLUCOPHAGE) 1000 MG tablet TAKE 1 TABLET TWICE DAILY WITH A MEAL 180 tablet 1   Multiple Vitamins-Minerals (ONE DAILY MENS PO)      No facility-administered medications prior to visit.    PAST MEDICAL HISTORY: Past Medical History:  Diagnosis Date   Diabetes mellitus without complication ([place])    Hyperlipemia    Hypertension     PAST SURGICAL HISTORY: Past Surgical History:  Procedure Laterality Date   FRACTURE SURGERY     R forearm    FAMILY HISTORY: Family History  Problem Relation Age of Onset   Lung cancer Father    Kidney cancer Brother     SOCIAL HISTORY: Social History   Socioeconomic History   Marital status: Divorced    Spouse name: Not on file   Number of children: 2   Years of education: College   Highest education level: Not on file  Occupational History  Not on file  Tobacco Use   Smoking status: Never   Smokeless tobacco: Never  Substance and Sexual Activity   Alcohol use: Yes    Alcohol/week: 0.0 standard drinks   Drug use: No   Sexual activity: Never    Birth control/protection: Abstinence  Other Topics Concern   Not on file  Social History Narrative   Not on file   Social Determinants of Health   Financial Resource Strain: Not on file  Food Insecurity: Not on file  Transportation Needs: Not on file  Physical Activity: Not on file  Stress: Not on file  Social Connections: Not on file  Intimate Partner Violence: Not on file      PHYSICAL EXAM  Vitals:   09/04/21 1255  BP: (!) 144/70  Pulse: 82  Weight: 194 lb 9.6 oz (88.3 kg)  Height: 5\' 6"  (1.676  m)   Body mass index is 31.41 kg/m.  Generalized: Well developed, in no acute distress  Chest: Lungs clear to auscultation bilaterally  Neurological examination  Mentation: Alert oriented to time, place, history taking. Follows all commands speech and language fluent Cranial nerve II-XII: Extraocular movements were full, visual field were full on confrontational test Head turning and shoulder shrug  were normal and symmetric. Motor: The motor testing reveals 5 over 5 strength of all 4 extremities. Good symmetric motor tone is noted throughout.  Sensory: Sensory testing is intact to soft touch on all 4 extremities. No evidence of extinction is noted.  Gait and station: Gait is normal.    DIAGNOSTIC DATA (LABS, IMAGING, TESTING) - I reviewed patient records, labs, notes, testing and imaging myself where available.  Lab Results  Component Value Date   WBC 8.7 02/01/2017   HGB 14.0 (A) 02/01/2017   HCT 39.7 (A) 02/01/2017   MCV 92.9 02/01/2017      Component Value Date/Time   NA 136 06/19/2021 0930   NA 140 12/04/2020 0856   K 5.0 06/19/2021 0930   CL 106 06/19/2021 0930   CO2 23 06/19/2021 0930   GLUCOSE 132 (H) 06/19/2021 0930   BUN 19 06/19/2021 0930   BUN 22 12/04/2020 0856   CREATININE 1.33 06/19/2021 0930   CREATININE 0.83 02/17/2016 0934   CALCIUM 9.7 06/19/2021 0930   PROT 7.1 06/19/2021 0930   PROT 7.4 12/04/2020 0856   ALBUMIN 4.4 06/19/2021 0930   ALBUMIN 4.9 (H) 12/04/2020 0856   AST 20 06/19/2021 0930   ALT 15 06/19/2021 0930   ALKPHOS 56 06/19/2021 0930   BILITOT 0.5 06/19/2021 0930   BILITOT 0.3 12/04/2020 0856   GFRNONAA 61 06/05/2020 0835   GFRNONAA 89 02/17/2016 0934   GFRAA 71 06/05/2020 0835   GFRAA >89 02/17/2016 0934   Lab Results  Component Value Date   CHOL 150 06/19/2021   HDL 43.40 06/19/2021   LDLCALC 94 06/19/2021   TRIG 62.0 06/19/2021   CHOLHDL 3 06/19/2021   Lab Results  Component Value Date   HGBA1C 7.5 (H) 06/19/2021        ASSESSMENT AND PLAN 75 y.o. year old male  has a past medical history of Diabetes mellitus without complication ([place]), Hyperlipemia, and Hypertension. here with:  OSA on CPAP  - CPAP compliance good - Good treatment of AHI  - Encourage patient to use CPAP nightly and > 4 hours each night - F/U in 1 year or sooner if needed  Ward Givens, MSN, NP-C 09/04/2021, 1:16 PM [facility] [address]  [place], [place] [address] [phone]

## 2021-09-17 ENCOUNTER — Telehealth: Payer: Self-pay | Admitting: Family Medicine

## 2021-09-17 NOTE — Telephone Encounter (Signed)
Left message for patient to call back and schedule Medicare Annual Wellness Visit (AWV) in office.  ? ?If not able to come in office, please offer to do virtually or by telephone.  Left office number and my jabber #[phone]. ? ?Last AWV:02/14/2020 ? ?Please schedule at anytime with Nurse Health Advisor. ?  ?

## 2021-10-16 ENCOUNTER — Ambulatory Visit (INDEPENDENT_AMBULATORY_CARE_PROVIDER_SITE_OTHER): Payer: Medicare HMO

## 2021-10-16 DIAGNOSIS — Z0000 Encounter for general adult medical examination without abnormal findings: Secondary | ICD-10-CM | POA: Diagnosis not present

## 2021-10-16 NOTE — Progress Notes (Signed)
Subjective:   Derrick Becker is a 76 y.o. male who presents for an Subsequent Medicare Annual Wellness Visit.  I connected with Nathaniel Man today by telephone and verified that I am speaking with the correct person using two identifiers. Location patient: home Location provider: work Persons participating in the virtual visit: patient, provider.   I discussed the limitations, risks, security and privacy concerns of performing an evaluation and management service by telephone and the availability of in person appointments. I also discussed with the patient that there may be a patient responsible charge related to this service. The patient expressed understanding and verbally consented to this telephonic visit.    Interactive audio and video telecommunications were attempted between this provider and patient, however failed, due to patient having technical difficulties OR patient did not have access to video capability.  We continued and completed visit with audio only.    Review of Systems     Cardiac Risk Factors include: advanced age (>47men, >11 women);diabetes mellitus;hypertension;male gender;dyslipidemia     Objective:    Today's Vitals   There is no height or weight on file to calculate BMI.  Advanced Directives 10/16/2021 02/14/2020 01/04/2019 02/02/2017 02/02/2017 02/02/2017  Does Patient Have a Medical Advance Directive? No No No No Yes;No No  Would patient like information on creating a medical advance directive? No - Patient declined Yes (ED - Information included in AVS) No - Patient declined Yes (MAU/Ambulatory/Procedural Areas - Information given) - Yes (MAU/Ambulatory/Procedural Areas - Information given)    Current Medications (verified) Outpatient Encounter Medications as of 10/16/2021  Medication Sig   allopurinol (ZYLOPRIM) 100 MG tablet TAKE 1 TABLET EVERY DAY   amLODipine (NORVASC) 10 MG tablet TAKE 1 TABLET EVERY DAY   aspirin EC 81 MG tablet Take 81 mg by mouth  daily.   atorvastatin (LIPITOR) 80 MG tablet TAKE 1 TABLET EVERY DAY   colchicine 0.6 MG tablet 2 tablets once by mouth if gout flare, 1 additional tablet in 1 hour if not improved.   glipiZIDE (GLUCOTROL) 10 MG tablet TAKE ONE TABLET IN THE MORNING WITH MORNING MEAL AND 1 TABLET WITH EVENING MEAL.   losartan-hydrochlorothiazide (HYZAAR) 100-25 MG tablet TAKE 1 TABLET EVERY DAY   metFORMIN (GLUCOPHAGE) 1000 MG tablet TAKE 1 TABLET TWICE DAILY WITH A MEAL   Multiple Vitamins-Minerals (ONE DAILY MENS PO)    No facility-administered encounter medications on file as of 10/16/2021.    Allergies (verified) Patient has no known allergies.   History: Past Medical History:  Diagnosis Date   Diabetes mellitus without complication ([place])    Hyperlipemia    Hypertension    Past Surgical History:  Procedure Laterality Date   FRACTURE SURGERY     R forearm   Family History  Problem Relation Age of Onset   Lung cancer Father    Kidney cancer Brother    Social History   Socioeconomic History   Marital status: Divorced    Spouse name: Not on file   Number of children: 2   Years of education: College   Highest education level: Not on file  Occupational History   Not on file  Tobacco Use   Smoking status: Never   Smokeless tobacco: Never  Substance and Sexual Activity   Alcohol use: Yes    Alcohol/week: 0.0 standard drinks   Drug use: No   Sexual activity: Never    Birth control/protection: Abstinence  Other Topics Concern   Not on file  Social History  Narrative   Not on file   Social Determinants of Health   Financial Resource Strain: Low Risk    Difficulty of Paying Living Expenses: Not hard at all  Food Insecurity: No Food Insecurity   Worried About Charity fundraiser in the Last Year: Never true   [place] in the Last Year: Never true  Transportation Needs: No Transportation Needs   Lack of Transportation (Medical): No   Lack of Transportation (Non-Medical): No   Physical Activity: Sufficiently Active   Days of Exercise per Week: 4 days   Minutes of Exercise per Session: 50 min  Stress: No Stress Concern Present   Feeling of Stress : Not at all  Social Connections: Socially Isolated   Frequency of Communication with Friends and Family: Twice a week   Frequency of Social Gatherings with Friends and Family: Twice a week   Attends Religious Services: Never   Marine scientist or Organizations: No   Attends Music therapist: Never   Marital Status: Divorced    Tobacco Counseling Counseling given: Not Answered   Clinical Intake:  Pre-visit preparation completed: Yes  Pain : No/denies pain     Nutritional Risks: None Diabetes: Yes CBG done?: No Did pt. bring in CBG monitor from home?: No  How often do you need to have someone help you when you read instructions, pamphlets, or other written materials from your doctor or pharmacy?: 1 - Never What is the last grade level you completed in school?: BS  Diabetic?yes Nutrition Risk Assessment:  Has the patient had any N/V/D within the last 2 months?  No  Does the patient have any non-healing wounds?  No  Has the patient had any unintentional weight loss or weight gain?  No   Diabetes:  Is the patient diabetic?  Yes  If diabetic, was a CBG obtained today?  No  Did the patient bring in their glucometer from home?  No  How often do you monitor your CBG's? Daily .   Financial Strains and Diabetes Management:  Are you having any financial strains with the device, your supplies or your medication? No .  Does the patient want to be seen by Chronic Care Management for management of their diabetes?  No  Would the patient like to be referred to a Nutritionist or for Diabetic Management?  No   Diabetic Exams:  Diabetic Eye Exam: Completed 04/2021 Diabetic Foot Exam: Overdue, Pt has been advised about the importance in completing this exam. Pt is scheduled for diabetic foot  exam on next office visit .   Interpreter Needed?: No  Information entered by :: [place] of Daily Living In your present state of health, do you have any difficulty performing the following activities: 10/16/2021  Hearing? N  Vision? N  Difficulty concentrating or making decisions? N  Walking or climbing stairs? N  Dressing or bathing? N  Doing errands, shopping? N  Preparing Food and eating ? N  Using the Toilet? N  In the past six months, have you accidently leaked urine? N  Do you have problems with loss of bowel control? N  Managing your Medications? N  Managing your Finances? N  Housekeeping or managing your Housekeeping? N  Some recent data might be hidden    Patient Care Team: [name], MD as PCP - General (Family Medicine)  Indicate any recent Medical Services you may have received from other than [facility] providers in the  past year (date may be approximate).     Assessment:   This is a routine wellness examination for [place].  Hearing/Vision screen Vision Screening - Comments:: Annual eye exams wear glasses   Dietary issues and exercise activities discussed: Current Exercise Habits: Home exercise routine, Type of exercise: walking, Time (Minutes): 45, Frequency (Times/Week): 4, Weekly Exercise (Minutes/Week): 180, Intensity: Mild, Exercise limited by: None identified   Goals Addressed   None    Depression Screen PHQ 2/9 Scores 10/16/2021 06/19/2021 12/04/2020 06/05/2020 02/14/2020 12/04/2019 08/28/2019  PHQ - 2 Score 0 0 0 0 0 0 0  PHQ- 9 Score - 1 - - - - -    Fall Risk Fall Risk  10/16/2021 06/19/2021 12/04/2020 06/05/2020 02/14/2020  Falls in the past year? 0 0 0 0 0  Number falls in past yr: 0 0 - - 0  Injury with Fall? 0 0 - - 0  Risk for fall due to : - No Fall Risks - - -  Follow up Falls evaluation completed Falls evaluation completed Falls evaluation completed Falls evaluation completed Falls evaluation completed;Education provided     FALL RISK PREVENTION PERTAINING TO THE HOME:  Any stairs in or around the home? Yes  If so, are there any without handrails? No  Home free of loose throw rugs in walkways, pet beds, electrical cords, etc? Yes  Adequate lighting in your home to reduce risk of falls? Yes   ASSISTIVE DEVICES UTILIZED TO PREVENT FALLS:  Life alert? No  Use of a cane, walker or w/c? No  Grab bars in the bathroom? No  Shower chair or bench in shower? No  Elevated toilet seat or a handicapped toilet? No    Cognitive Function:  Normal cognitive status assessed by direct observation by this Nurse Health Advisor. No abnormalities found.     6CIT Screen 02/14/2020 01/04/2019  What Year? 0 points 0 points  What month? 0 points 0 points  What time? 0 points 0 points  Count back from 20 0 points 0 points  Months in reverse 0 points 0 points  Repeat phrase 0 points 0 points  Total Score 0 0    Immunizations Immunization History  Administered Date(s) Administered   Influenza Split 08/23/2012   Influenza, High Dose Seasonal PF 07/12/2018   Influenza,inj,Quad PF,6+ Mos 09/06/2017   Influenza-Unspecified 05/28/2021   Moderna Sars-Covid-2 Vaccination 12/15/2019, 01/15/2020, 08/14/2020   Pneumococcal Conjugate-13 02/02/2017   Pneumococcal Polysaccharide-23 03/14/2018    TDAP status: Due, Education has been provided regarding the importance of this vaccine. Advised may receive this vaccine at local pharmacy or Health Dept. Aware to provide a copy of the vaccination record if obtained from local pharmacy or Health Dept. Verbalized acceptance and understanding.  Flu Vaccine status: Up to date  Pneumococcal vaccine status: Up to date  Covid-19 vaccine status: Completed vaccines  Qualifies for Shingles Vaccine? Yes   Zostavax completed No   Shingrix Completed?: No.    Education has been provided regarding the importance of this vaccine. Patient has been advised to call insurance company to determine out  of pocket expense if they have not yet received this vaccine. Advised may also receive vaccine at local pharmacy or Health Dept. Verbalized acceptance and understanding.  Screening Tests Health Maintenance  Topic Date Due   TETANUS/TDAP  Never done   Zoster Vaccines- Shingrix (1 of 2) Never done   COVID-19 Vaccine (4 - Booster for Moderna series) 10/09/2020   OPHTHALMOLOGY EXAM  08/15/2021  FOOT EXAM  12/04/2021   HEMOGLOBIN A1C  12/17/2021   COLONOSCOPY (Pts 45-35yrs Insurance coverage will need to be confirmed)  09/22/2025   Pneumonia Vaccine 35+ Years old  Completed   INFLUENZA VACCINE  Completed   Hepatitis C Screening  Completed   HPV VACCINES  Aged Out    Health Maintenance  Health Maintenance Due  Topic Date Due   TETANUS/TDAP  Never done   Zoster Vaccines- Shingrix (1 of 2) Never done   COVID-19 Vaccine (4 - Booster for Moderna series) 10/09/2020   OPHTHALMOLOGY EXAM  08/15/2021    Colorectal cancer screening: Type of screening: Colonoscopy. Completed 09/23/2015. Repeat every 10 years  Lung Cancer Screening: (Low Dose CT Chest recommended if Age 83-80 years, 30 pack-year currently smoking OR have quit w/in 15years.) does not qualify.   Lung Cancer Screening Referral: n/a  Additional Screening:  Hepatitis C Screening: does not qualify; Completed 09/06/2017  Vision Screening: Recommended annual ophthalmology exams for early detection of glaucoma and other disorders of the eye. Is the patient up to date with their annual eye exam?  Yes  Who is the provider or what is the name of the office in which the patient attends annual eye exams? Dr.Groat  If pt is not established with a provider, would they like to be referred to a provider to establish care? No .   Dental Screening: Recommended annual dental exams for proper oral hygiene  Community Resource Referral / Chronic Care Management: CRR required this visit?  No   CCM required this visit?  No      Plan:      I have personally reviewed and noted the following in the patients chart:   Medical and social history Use of alcohol, tobacco or illicit drugs  Current medications and supplements including opioid prescriptions. Patient is not currently taking opioid prescriptions. Functional ability and status Nutritional status Physical activity Advanced directives List of other physicians Hospitalizations, surgeries, and ER visits in previous 12 months Vitals Screenings to include cognitive, depression, and falls Referrals and appointments  In addition, I have reviewed and discussed with patient certain preventive protocols, quality metrics, and best practice recommendations. A written personalized care plan for preventive services as well as general preventive health recommendations were provided to patient.     Randel Pigg, LPN   8/89/1694   Nurse Notes: none

## 2021-10-16 NOTE — Patient Instructions (Signed)
Derrick Becker , Thank you for taking time to come for your Medicare Wellness Visit. I appreciate your ongoing commitment to your health goals. Please review the following plan we discussed and let me know if I can assist you in the future.   Screening recommendations/referrals: Colonoscopy: 09/23/2015  due 2026 Recommended yearly ophthalmology/optometry visit for glaucoma screening and checkup Recommended yearly dental visit for hygiene and checkup  Vaccinations: Influenza vaccine: completed  Pneumococcal vaccine: completed  Tdap vaccine: due  Shingles vaccine: will consider     Advanced directives: none   Conditions/risks identified: none   Next appointment: none   Preventive Care 40 Years and Older, Male Preventive care refers to lifestyle choices and visits with your health care provider that can promote health and wellness. What does preventive care include? A yearly physical exam. This is also called an annual well check. Dental exams once or twice a year. Routine eye exams. Ask your health care provider how often you should have your eyes checked. Personal lifestyle choices, including: Daily care of your teeth and gums. Regular physical activity. Eating a healthy diet. Avoiding tobacco and drug use. Limiting alcohol use. Practicing safe sex. Taking low doses of aspirin every day. Taking vitamin and mineral supplements as recommended by your health care provider. What happens during an annual well check? The services and screenings done by your health care provider during your annual well check will depend on your age, overall health, lifestyle risk factors, and family history of disease. Counseling  Your health care provider may ask you questions about your: Alcohol use. Tobacco use. Drug use. Emotional well-being. Home and relationship well-being. Sexual activity. Eating habits. History of falls. Memory and ability to understand (cognition). Work and work  Statistician. Screening  You may have the following tests or measurements: Height, weight, and BMI. Blood pressure. Lipid and cholesterol levels. These may be checked every 5 years, or more frequently if you are over 73 years old. Skin check. Lung cancer screening. You may have this screening every year starting at age 40 if you have a 30-pack-year history of smoking and currently smoke or have quit within the past 15 years. Fecal occult blood test (FOBT) of the stool. You may have this test every year starting at age 14. Flexible sigmoidoscopy or colonoscopy. You may have a sigmoidoscopy every 5 years or a colonoscopy every 10 years starting at age 39. Prostate cancer screening. Recommendations will vary depending on your family history and other risks. Hepatitis C blood test. Hepatitis B blood test. Sexually transmitted disease (STD) testing. Diabetes screening. This is done by checking your blood sugar (glucose) after you have not eaten for a while (fasting). You may have this done every 1-3 years. Abdominal aortic aneurysm (AAA) screening. You may need this if you are a current or former smoker. Osteoporosis. You may be screened starting at age 11 if you are at high risk. Talk with your health care provider about your test results, treatment options, and if necessary, the need for more tests. Vaccines  Your health care provider may recommend certain vaccines, such as: Influenza vaccine. This is recommended every year. Tetanus, diphtheria, and acellular pertussis (Tdap, Td) vaccine. You may need a Td booster every 10 years. Zoster vaccine. You may need this after age 33. Pneumococcal 13-valent conjugate (PCV13) vaccine. One dose is recommended after age 85. Pneumococcal polysaccharide (PPSV23) vaccine. One dose is recommended after age 71. Talk to your health care provider about which screenings and vaccines you need  and how often you need them. This information is not intended to replace  advice given to you by your health care provider. Make sure you discuss any questions you have with your health care provider. Document Released: 10/18/2015 Document Revised: 06/10/2016 Document Reviewed: 07/23/2015 Elsevier Interactive Patient Education  2017 [employer] Prevention in the Home Falls can cause injuries. They can happen to people of all ages. There are many things you can do to make your home safe and to help prevent falls. What can I do on the outside of my home? Regularly fix the edges of walkways and driveways and fix any cracks. Remove anything that might make you trip as you walk through a door, such as a raised step or threshold. Trim any bushes or trees on the path to your home. Use bright outdoor lighting. Clear any walking paths of anything that might make someone trip, such as rocks or tools. Regularly check to see if handrails are loose or broken. Make sure that both sides of any steps have handrails. Any raised decks and porches should have guardrails on the edges. Have any leaves, snow, or ice cleared regularly. Use sand or salt on walking paths during winter. Clean up any spills in your garage right away. This includes oil or grease spills. What can I do in the bathroom? Use night lights. Install grab bars by the toilet and in the tub and shower. Do not use towel bars as grab bars. Use non-skid mats or decals in the tub or shower. If you need to sit down in the shower, use a plastic, non-slip stool. Keep the floor dry. Clean up any water that spills on the floor as soon as it happens. Remove soap buildup in the tub or shower regularly. Attach bath mats securely with double-sided non-slip rug tape. Do not have throw rugs and other things on the floor that can make you trip. What can I do in the bedroom? Use night lights. Make sure that you have a light by your bed that is easy to reach. Do not use any sheets or blankets that are too big for your bed.  They should not hang down onto the floor. Have a firm chair that has side arms. You can use this for support while you get dressed. Do not have throw rugs and other things on the floor that can make you trip. What can I do in the kitchen? Clean up any spills right away. Avoid walking on wet floors. Keep items that you use a lot in easy-to-reach places. If you need to reach something above you, use a strong step stool that has a grab bar. Keep electrical cords out of the way. Do not use floor polish or wax that makes floors slippery. If you must use wax, use non-skid floor wax. Do not have throw rugs and other things on the floor that can make you trip. What can I do with my stairs? Do not leave any items on the stairs. Make sure that there are handrails on both sides of the stairs and use them. Fix handrails that are broken or loose. Make sure that handrails are as long as the stairways. Check any carpeting to make sure that it is firmly attached to the stairs. Fix any carpet that is loose or worn. Avoid having throw rugs at the top or bottom of the stairs. If you do have throw rugs, attach them to the floor with carpet tape. Make sure that you  have a light switch at the top of the stairs and the bottom of the stairs. If you do not have them, ask someone to add them for you. What else can I do to help prevent falls? Wear shoes that: Do not have high heels. Have rubber bottoms. Are comfortable and fit you well. Are closed at the toe. Do not wear sandals. If you use a stepladder: Make sure that it is fully opened. Do not climb a closed stepladder. Make sure that both sides of the stepladder are locked into place. Ask someone to hold it for you, if possible. Clearly mark and make sure that you can see: Any grab bars or handrails. First and last steps. Where the edge of each step is. Use tools that help you move around (mobility aids) if they are needed. These  include: Canes. Walkers. Scooters. Crutches. Turn on the lights when you go into a dark area. Replace any light bulbs as soon as they burn out. Set up your furniture so you have a clear path. Avoid moving your furniture around. If any of your floors are uneven, fix them. If there are any pets around you, be aware of where they are. Review your medicines with your doctor. Some medicines can make you feel dizzy. This can increase your chance of falling. Ask your doctor what other things that you can do to help prevent falls. This information is not intended to replace advice given to you by your health care provider. Make sure you discuss any questions you have with your health care provider. Document Released: 07/18/2009 Document Revised: 02/27/2016 Document Reviewed: 10/26/2014 Elsevier Interactive Patient Education  2017 [employer].

## 2021-12-18 ENCOUNTER — Ambulatory Visit: Payer: Medicare HMO | Admitting: Family Medicine

## 2021-12-24 ENCOUNTER — Other Ambulatory Visit: Payer: Self-pay | Admitting: Family Medicine

## 2021-12-24 DIAGNOSIS — E1165 Type 2 diabetes mellitus with hyperglycemia: Secondary | ICD-10-CM

## 2022-01-09 ENCOUNTER — Encounter: Payer: Self-pay | Admitting: Family Medicine

## 2022-01-19 ENCOUNTER — Ambulatory Visit (INDEPENDENT_AMBULATORY_CARE_PROVIDER_SITE_OTHER): Payer: Medicare HMO | Admitting: Family Medicine

## 2022-01-19 ENCOUNTER — Encounter: Payer: Self-pay | Admitting: Family Medicine

## 2022-01-19 DIAGNOSIS — M109 Gout, unspecified: Secondary | ICD-10-CM

## 2022-01-19 DIAGNOSIS — E1165 Type 2 diabetes mellitus with hyperglycemia: Secondary | ICD-10-CM

## 2022-01-19 DIAGNOSIS — I10 Essential (primary) hypertension: Secondary | ICD-10-CM | POA: Diagnosis not present

## 2022-01-19 DIAGNOSIS — M1A0790 Idiopathic chronic gout, unspecified ankle and foot, without tophus (tophi): Secondary | ICD-10-CM

## 2022-01-19 DIAGNOSIS — E785 Hyperlipidemia, unspecified: Secondary | ICD-10-CM

## 2022-01-19 LAB — LIPID PANEL
Cholesterol: 140 mg/dL (ref 0–200)
HDL: 40.7 mg/dL (ref >39.00)
LDL Cholesterol: 88 mg/dL (ref 0–99)
NonHDL: 98.91
Total CHOL/HDL Ratio: 3
Triglycerides: 57 mg/dL (ref 0.0–149.0)
VLDL: 11.4 mg/dL (ref 0.0–40.0)

## 2022-01-19 LAB — HEMOGLOBIN A1C: Hgb A1c MFr Bld: 7.2 % — ABNORMAL HIGH (ref 4.6–6.5)

## 2022-01-19 LAB — COMPREHENSIVE METABOLIC PANEL
ALT: 19 U/L (ref 0–53)
AST: 20 U/L (ref 0–37)
Albumin: 4.6 g/dL (ref 3.5–5.2)
Alkaline Phosphatase: 52 U/L (ref 39–117)
BUN: 22 mg/dL (ref 6–23)
CO2: 26 mEq/L (ref 19–32)
Calcium: 9.5 mg/dL (ref 8.4–10.5)
Chloride: 102 mEq/L (ref 96–112)
Creatinine, Ser: 1.04 mg/dL (ref 0.40–1.50)
GFR: 69.97 mL/min (ref >60.00)
Glucose, Bld: 129 mg/dL — ABNORMAL HIGH (ref 70–99)
Potassium: 4.5 mEq/L (ref 3.5–5.1)
Sodium: 138 mEq/L (ref 135–145)
Total Bilirubin: 0.6 mg/dL (ref 0.2–1.2)
Total Protein: 7.1 g/dL (ref 6.0–8.3)

## 2022-01-19 MED ORDER — ALLOPURINOL 100 MG PO TABS: 100.0000 mg | ORAL_TABLET | Freq: Every day | ORAL | 1 refills | Status: DC

## 2022-01-19 MED ORDER — LOSARTAN POTASSIUM-HCTZ 100-25 MG PO TABS: 1.0000 | ORAL_TABLET | Freq: Every day | ORAL | 1 refills | Status: DC

## 2022-01-19 MED ORDER — ATORVASTATIN CALCIUM 80 MG PO TABS: 80.0000 mg | ORAL_TABLET | Freq: Every day | ORAL | 1 refills | Status: DC

## 2022-01-19 MED ORDER — COLCHICINE 0.6 MG PO TABS: ORAL_TABLET | ORAL | 2 refills | Status: DC

## 2022-01-19 MED ORDER — METFORMIN HCL 1000 MG PO TABS: ORAL_TABLET | ORAL | 1 refills | Status: DC

## 2022-01-19 MED ORDER — AMLODIPINE BESYLATE 10 MG PO TABS: 10.0000 mg | ORAL_TABLET | Freq: Every day | ORAL | 1 refills | Status: DC

## 2022-01-19 NOTE — Progress Notes (Signed)
? ?Subjective:  ?Patient ID: Derrick Becker, male    DOB: Apr 06, 1946  Age: 76 y.o. MRN: [id number] ? ?CC:  ?Chief Complaint  ?Patient presents with  ? Diabetes  ?  Due for labs no concerns   ? Hyperlipidemia  ?  Due for labs and refill   ? Hypertension  ?  Doing well denies physical sxs   ? Gout  ?  Pt due for refill on allopurinol notes no flares since last visit   ? Health Maintenance  ?  Had eye exam with Dr Katy Fitch will request records today   ? ? ?HPI ?Derrick Becker presents for  ? ?Hypertension: ?Losartan HCT 100/25 mg daily ?Home readings: 116/69.  ?No new med side effects.  ?BP Readings from Last 3 Encounters:  ?01/19/22 132/76  ?09/04/21 (!) 144/70  ?06/19/21 126/60  ? ?Lab Results  ?Component Value Date  ? CREATININE 1.33 06/19/2021  ? ?Hyperlipidemia: ?Lipitor 80 mg daily. No side effects, no myalgias.  ?Less exercise lately. Plans to increase - recently restarted his walking, yardwork.   ?In [place] since Christmas with sister who was sick.  ?Lab Results  ?Component Value Date  ? CHOL 150 06/19/2021  ? HDL 43.40 06/19/2021  ? [place] 94 06/19/2021  ? TRIG 62.0 06/19/2021  ? CHOLHDL 3 06/19/2021  ? ?Lab Results  ?Component Value Date  ? ALT 15 06/19/2021  ? AST 20 06/19/2021  ? ALKPHOS 56 06/19/2021  ? BILITOT 0.5 06/19/2021  ? ? ? ? ?Diabetes: ?With hyperglycemia.  Treated with Glucotrol 10 mg twice daily, metformin 1000 mg twice daily.  He is on statin and ARB.  Last visit in September 2022.  Overall stable A1c.  Walking for exercise and more active.  Few pound increase since that time. ?Wt Readings from Last 3 Encounters:  ?01/19/22 199 lb (90.3 kg)  ?09/04/21 194 lb 9.6 oz (88.3 kg)  ?06/19/21 189 lb 12.8 oz (86.1 kg)  ?Home readings: 128 this am. Higher past few months off exercise, better recently. Up to [id number] prior.  ?No symptomatic lows.  ?Microalbumin: Normal ratio March 2022. ?Optho, foot exam, pneumovax: Requesting recent eye exam report ? ?Gout: ?Last flare:no recent.  ?Daily meds:allopurinol  '100mg'$  qd.  ?Prn med: colchicine if needed - refill needed - written rx.  ?Lab Results  ?Component Value Date  ? LABURIC 8.0 12/04/2019  ? ? ?Lab Results  ?Component Value Date  ? HGBA1C 7.5 (H) 06/19/2021  ? HGBA1C 7.6 (H) 12/04/2020  ? HGBA1C 7.1 (H) 06/05/2020  ? ?Lab Results  ?Component Value Date  ? MICROALBUR 18.2 (H) 12/22/2014  ? [place] 94 06/19/2021  ? CREATININE 1.33 06/19/2021  ? ? ?History ?Patient Active Problem List  ? Diagnosis Date Noted  ? Hyperlipidemia 10/12/2013  ? Diabetes ([place]) 08/23/2012  ? HTN (hypertension) 08/23/2012  ? ?Past Medical History:  ?Diagnosis Date  ? Diabetes mellitus without complication ([place])   ? Hyperlipemia   ? Hypertension   ? ?Past Surgical History:  ?Procedure Laterality Date  ? FRACTURE SURGERY    ? R forearm  ? ?No Known Allergies ?Prior to Admission medications   ?Medication Sig Start Date End Date Taking? Authorizing Provider  ?allopurinol (ZYLOPRIM) 100 MG tablet TAKE 1 TABLET EVERY DAY 06/05/21  Yes [name], MD  ?amLODipine (NORVASC) 10 MG tablet TAKE 1 TABLET EVERY DAY 06/05/21  Yes [name], MD  ?aspirin EC 81 MG tablet Take 81 mg by mouth daily.   Yes [provider]  ?atorvastatin (LIPITOR) 80 MG tablet TAKE 1 TABLET EVERY DAY 06/05/21  Yes [name], MD  ?colchicine 0.6 MG tablet 2 tablets once by mouth if gout flare, 1 additional tablet in 1 hour if not improved. 06/05/20  Yes [name], MD  ?glipiZIDE (GLUCOTROL) 10 MG tablet TAKE ONE TABLET IN THE MORNING WITH MORNING MEAL AND 1 TABLET WITH EVENING MEAL. 12/24/21  Yes [name], MD  ?losartan-hydrochlorothiazide [facility]) 100-25 MG tablet TAKE 1 TABLET EVERY DAY 06/05/21  Yes [name], MD  ?metFORMIN (GLUCOPHAGE) 1000 MG tablet TAKE 1 TABLET TWICE DAILY WITH A MEAL 06/05/21  Yes [name], MD  ?Multiple Vitamins-Minerals (ONE DAILY MENS PO)  03/02/18  Yes [provider]  ? ?Social History  ? ?Socioeconomic History  ? Marital status: Divorced  ?   Spouse name: Not on file  ? Number of children: 2  ? Years of education: College  ? Highest education level: Not on file  ?Occupational History  ? Not on file  ?Tobacco Use  ? Smoking status: Never  ? Smokeless tobacco: Never  ?Substance and Sexual Activity  ? Alcohol use: Yes  ?  Alcohol/week: 0.0 standard drinks  ? Drug use: No  ? Sexual activity: Never  ?  Birth control/protection: Abstinence  ?Other Topics Concern  ? Not on file  ?Social History Narrative  ? Not on file  ? ?Social Determinants of Health  ? ?Financial Resource Strain: Low Risk   ? Difficulty of Paying Living Expenses: Not hard at all  ?Food Insecurity: No Food Insecurity  ? Worried About Charity fundraiser in the Last Year: Never true  ? Ran Out of Food in the Last Year: Never true  ?Transportation Needs: No Transportation Needs  ? Lack of Transportation (Medical): No  ? Lack of Transportation (Non-Medical): No  ?Physical Activity: Sufficiently Active  ? Days of Exercise per Week: 4 days  ? Minutes of Exercise per Session: 50 min  ?Stress: No Stress Concern Present  ? Feeling of Stress : Not at all  ?Social Connections: Socially Isolated  ? Frequency of Communication with Friends and Family: Twice a week  ? Frequency of Social Gatherings with Friends and Family: Twice a week  ? Attends Religious Services: Never  ? Active Member of Clubs or Organizations: No  ? Attends Archivist Meetings: Never  ? Marital Status: Divorced  ?Intimate Partner Violence: Not At Risk  ? Fear of Current or Ex-Partner: No  ? Emotionally Abused: No  ? Physically Abused: No  ? Sexually Abused: No  ? ? ?Review of Systems  ?Constitutional:  Negative for fatigue and unexpected weight change.  ?Eyes:  Negative for visual disturbance.  ?Respiratory:  Negative for cough, chest tightness and shortness of breath.   ?Cardiovascular:  Negative for chest pain, palpitations and leg swelling.  ?Gastrointestinal:  Negative for abdominal pain and blood in stool.   ?Neurological:  Negative for dizziness, light-headedness and headaches.  ? ? ?Objective:  ? ?Vitals:  ? 01/19/22 0858  ?BP: 132/76  ?Pulse: 74  ?Resp: 17  ?Temp: 98 ?F (36.7 ?C)  ?TempSrc: Temporal  ?SpO2: 96%  ?Weight: 199 lb (90.3 kg)  ?Height: '5\' 6"'$  (1.676 m)  ? ? ? ?Physical Exam ?Vitals reviewed.  ?Constitutional:   ?   Appearance: He is well-developed.  ?HENT:  ?   Head: Normocephalic and atraumatic.  ?Neck:  ?   Vascular: No carotid bruit or JVD.  ?  Cardiovascular:  ?   Rate and Rhythm: Normal rate and regular rhythm.  ?   Heart sounds: Normal heart sounds. No murmur heard. ?Pulmonary:  ?   Effort: Pulmonary effort is normal.  ?   Breath sounds: Normal breath sounds. No rales.  ?Musculoskeletal:  ?   Right lower leg: No edema.  ?   Left lower leg: No edema.  ?Skin: ?   General: Skin is warm and dry.  ?Neurological:  ?   Mental Status: He is alert and oriented to person, place, and time.  ?Psychiatric:     ?   Mood and Affect: Mood normal.  ? ? ? ? ? ?Assessment & Plan:  ?Derrick Becker is a 76 y.o. male . ?Hyperlipidemia, unspecified hyperlipidemia type - Plan: atorvastatin (LIPITOR) 80 MG tablet, Comprehensive metabolic panel, Lipid panel ?  -  Stable, tolerating current regimen. Medications refilled. Labs pending as above.  ? ?Essential hypertension - Plan: losartan-hydrochlorothiazide (HYZAAR) 100-25 MG tablet, amLODipine (NORVASC) 10 MG tablet ? -  Stable, tolerating current regimen. Medications refilled. Labs pending as above.  ? ?Type 2 diabetes mellitus with hyperglycemia, without long-term current use of insulin ([place]) - Plan: metFORMIN (GLUCOPHAGE) 1000 MG tablet, Comprehensive metabolic panel, Hemoglobin A1c ? -Decreased control by some recent home readings but has started to improve with increased exercise.  Borderline control on last A1c.  Check labs to determine changes but anticipate improvement with increased exercise. ? ?Chronic gout of ankle, unspecified cause, unspecified laterality - Plan:  allopurinol (ZYLOPRIM) 100 MG tablet ?Gout, unspecified cause, unspecified chronicity, unspecified site - Plan: colchicine 0.6 MG tablet ? -Stable, colchicine refilled if needed, hold statin if colchicine taken.

## 2022-01-19 NOTE — Patient Instructions (Signed)
No medication changes for now.  Depending on blood sugar test we can follow-up a little bit sooner or possible change medications but I suspect the increased walking will help.  Colchicine was printed.  Make sure to not take your cholesterol medicine if you are taking that medication temporarily.  Follow-up in 6 months but let me know if there are questions sooner.  Take care.  ?

## 2022-02-04 ENCOUNTER — Encounter: Payer: Self-pay | Admitting: Family Medicine

## 2022-02-10 DIAGNOSIS — H40013 Open angle with borderline findings, low risk, bilateral: Secondary | ICD-10-CM | POA: Diagnosis not present

## 2022-06-26 ENCOUNTER — Other Ambulatory Visit: Payer: Self-pay | Admitting: Family Medicine

## 2022-06-26 DIAGNOSIS — M1A0790 Idiopathic chronic gout, unspecified ankle and foot, without tophus (tophi): Secondary | ICD-10-CM

## 2022-06-26 DIAGNOSIS — E1165 Type 2 diabetes mellitus with hyperglycemia: Secondary | ICD-10-CM

## 2022-06-26 DIAGNOSIS — E785 Hyperlipidemia, unspecified: Secondary | ICD-10-CM

## 2022-06-26 DIAGNOSIS — I10 Essential (primary) hypertension: Secondary | ICD-10-CM

## 2022-07-13 ENCOUNTER — Ambulatory Visit: Payer: Medicare HMO | Admitting: Family Medicine

## 2022-07-30 ENCOUNTER — Encounter: Payer: Self-pay | Admitting: Family Medicine

## 2022-07-30 ENCOUNTER — Other Ambulatory Visit: Payer: Self-pay

## 2022-07-30 ENCOUNTER — Ambulatory Visit (INDEPENDENT_AMBULATORY_CARE_PROVIDER_SITE_OTHER): Payer: Medicare HMO | Admitting: Family Medicine

## 2022-07-30 VITALS — BP 118/58 | HR 73 | Temp 98.0°F | Ht 66.0 in | Wt 195.2 lb

## 2022-07-30 DIAGNOSIS — E785 Hyperlipidemia, unspecified: Secondary | ICD-10-CM

## 2022-07-30 DIAGNOSIS — E1165 Type 2 diabetes mellitus with hyperglycemia: Secondary | ICD-10-CM | POA: Diagnosis not present

## 2022-07-30 DIAGNOSIS — I10 Essential (primary) hypertension: Secondary | ICD-10-CM | POA: Diagnosis not present

## 2022-07-30 DIAGNOSIS — M109 Gout, unspecified: Secondary | ICD-10-CM

## 2022-07-30 LAB — COMPREHENSIVE METABOLIC PANEL
ALT: 18 U/L (ref 0–53)
AST: 21 U/L (ref 0–37)
Albumin: 4.6 g/dL (ref 3.5–5.2)
Alkaline Phosphatase: 51 U/L (ref 39–117)
BUN: 19 mg/dL (ref 6–23)
CO2: 26 mEq/L (ref 19–32)
Calcium: 9.5 mg/dL (ref 8.4–10.5)
Chloride: 104 mEq/L (ref 96–112)
Creatinine, Ser: 1.04 mg/dL (ref 0.40–1.50)
GFR: 69.71 mL/min (ref >60.00)
Glucose, Bld: 128 mg/dL — ABNORMAL HIGH (ref 70–99)
Potassium: 4.4 mEq/L (ref 3.5–5.1)
Sodium: 138 mEq/L (ref 135–145)
Total Bilirubin: 0.4 mg/dL (ref 0.2–1.2)
Total Protein: 7.4 g/dL (ref 6.0–8.3)

## 2022-07-30 LAB — LIPID PANEL
Cholesterol: 125 mg/dL (ref 0–200)
HDL: 35 mg/dL — ABNORMAL LOW (ref >39.00)
LDL Cholesterol: 80 mg/dL (ref 0–99)
NonHDL: 89.97
Total CHOL/HDL Ratio: 4
Triglycerides: 51 mg/dL (ref 0.0–149.0)
VLDL: 10.2 mg/dL (ref 0.0–40.0)

## 2022-07-30 LAB — MICROALBUMIN / CREATININE URINE RATIO
Creatinine,U: 89.8 mg/dL
Microalb Creat Ratio: 6.1 mg/g (ref 0.0–30.0)
Microalb, Ur: 5.5 mg/dL — ABNORMAL HIGH (ref 0.0–1.9)

## 2022-07-30 LAB — HEMOGLOBIN A1C: Hgb A1c MFr Bld: 7.2 % — ABNORMAL HIGH (ref 4.6–6.5)

## 2022-07-30 LAB — URIC ACID: Uric Acid, Serum: 5.7 mg/dL (ref 4.0–7.8)

## 2022-07-30 MED ORDER — LOSARTAN POTASSIUM-HCTZ 100-12.5 MG PO TABS: ORAL_TABLET | ORAL | 1 refills | Status: DC

## 2022-07-30 MED ORDER — GLIPIZIDE 10 MG PO TABS: ORAL_TABLET | ORAL | 1 refills | Status: DC

## 2022-07-30 MED ORDER — COLCHICINE 0.6 MG PO TABS: ORAL_TABLET | ORAL | 2 refills | Status: AC

## 2022-07-30 NOTE — Progress Notes (Addendum)
Subjective:  Patient ID: Derrick Becker, male    DOB: [birth year]  Age: 76 y.o. MRN: [id number]  CC:  Chief Complaint  Patient presents with   Diabetes    Pt states all is well    HPI Derrick Becker presents for   Diabetes: With history of hyperglycemia.  Treated with Glucotrol 10 mg twice daily, metformin 1000 mg twice daily.  He is on statin and ARB.  Improving A1c in April.  Weight has continued to improve, down 4 more pounds today. Home readings:  Fasting: 100-130. No symptomatic lows  Microalbumin: Normal ratio March 2022, repeat today Optho, foot exam, pneumovax: Up-to-date  Lab Results  Component Value Date   HGBA1C 7.2 (H) 01/19/2022   HGBA1C 7.5 (H) 06/19/2021   HGBA1C 7.6 (H) 12/04/2020   Lab Results  Component Value Date   MICROALBUR 18.2 (H) 12/22/2014   LDLCALC 88 01/19/2022   CREATININE 1.04 01/19/2022   Wt Readings from Last 3 Encounters:  07/30/22 195 lb 3.2 oz (88.5 kg)  01/19/22 199 lb (90.3 kg)  09/04/21 194 lb 9.6 oz (88.3 kg)    Hypertension: Treated with losartan HCTZ 100/25 mg daily, amlodipine '10mg'$  qd.  Home readings:  118/59-60. Rare 120-130.  No orthostatic or dizziness sx's.  BP Readings from Last 3 Encounters:  07/30/22 (!) 118/58  01/19/22 132/76  09/04/21 (!) 144/70   Lab Results  Component Value Date   CREATININE 1.04 01/19/2022    Hyperlipidemia: Lipitor 80 mg daily, no new myalgias.  Lab Results  Component Value Date   CHOL 140 01/19/2022   HDL 40.70 01/19/2022   LDLCALC 88 01/19/2022   TRIG 57.0 01/19/2022   CHOLHDL 3 01/19/2022   Lab Results  Component Value Date   ALT 19 01/19/2022   AST 20 01/19/2022   ALKPHOS 52 01/19/2022   BILITOT 0.6 01/19/2022    Gout: Last flare:none recent.  Daily meds: Allopurinol 100 mg daily Prn med: Colchicine if needed. - needs 30d supply to [facility] for improved cost.  Lab Results  Component Value Date   LABURIC 8.0 12/04/2019    Health maintenance Flu vaccine in  September, with shingrix, and rsv vaccine at [facility]  COVID booster in September. - all at [facility].  Immunization History  Administered Date(s) Administered   Fluad Quad(high Dose 65+) 03/18/2022   Influenza Split 08/23/2012   Influenza, High Dose Seasonal PF 07/12/2018, 07/19/2019   Influenza,inj,Quad PF,6+ Mos 09/06/2017   Influenza-Unspecified 05/28/2021, 06/20/2021   Moderna Sars-Covid-2 Vaccination 12/15/2019, 01/15/2020, 08/14/2020   Pneumococcal Conjugate-13 02/02/2017   Pneumococcal Polysaccharide-23 03/14/2018   Zoster Recombinat (Shingrix) 03/18/2022    History Patient Active Problem List   Diagnosis Date Noted   Sebaceous cyst 03/13/2019   Hyperlipidemia 10/12/2013   Diabetes ([place]) 08/23/2012   HTN (hypertension) 08/23/2012   Past Medical History:  Diagnosis Date   Diabetes mellitus without complication ([place])    Hyperlipemia    Hypertension    Past Surgical History:  Procedure Laterality Date   FRACTURE SURGERY     R forearm   Allergies  Allergen Reactions   No Known Allergies    Prior to Admission medications   Medication Sig Start Date End Date Taking? Authorizing Provider  allopurinol (ZYLOPRIM) 100 MG tablet TAKE 1 TABLET EVERY DAY 06/29/22  Yes [name], MD  amLODipine (NORVASC) 10 MG tablet TAKE 1 TABLET EVERY DAY 06/29/22  Yes [name], MD  aspirin EC 81 MG tablet Take 81 mg  by mouth daily.   Yes [provider]  atorvastatin (LIPITOR) 80 MG tablet TAKE 1 TABLET EVERY DAY 06/29/22  Yes [name], MD  colchicine 0.6 MG tablet 2 tablets once by mouth if gout flare, 1 additional tablet in 1 hour if not improved. 01/19/22  Yes [name], MD  glipiZIDE (GLUCOTROL) 10 MG tablet TAKE ONE TABLET IN THE MORNING WITH MORNING MEAL AND 1 TABLET WITH EVENING MEAL. 12/24/21  Yes [name], MD  losartan-hydrochlorothiazide [facility]) 100-25 MG tablet TAKE 1 TABLET EVERY DAY 06/29/22  Yes [name], MD  metFORMIN  (GLUCOPHAGE) 1000 MG tablet TAKE 1 TABLET TWICE DAILY WITH MEALS 06/29/22  Yes [name], MD  Multiple Vitamins-Minerals (ONE DAILY MENS PO)  03/02/18  Yes [provider]  FLUZONE HIGH-DOSE QUADRIVALENT 0.7 ML SUSY  06/18/22   [provider]  glipiZIDE (GLUCOTROL) 10 MG tablet Glipizide    [provider]  losartan-hydrochlorothiazide (HYZAAR) 100-12.5 MG tablet Losartan/ HCTZ    [provider]  METFORMIN & DIET MANAGE PROD PO Metformin    [provider]  [facility] injection  04/27/22   [provider]   Social History   Socioeconomic History   Marital status: Divorced    Spouse name: Not on file   Number of children: 2   Years of education: College   Highest education level: Not on file  Occupational History   Not on file  Tobacco Use   Smoking status: Never   Smokeless tobacco: Never  Substance and Sexual Activity   Alcohol use: Yes    Alcohol/week: 0.0 standard drinks of alcohol   Drug use: No   Sexual activity: Never    Birth control/protection: Abstinence  Other Topics Concern   Not on file  Social History Narrative   Not on file   Social Determinants of Health   Financial Resource Strain: Low Risk  (10/16/2021)   Overall Financial Resource Strain (CARDIA)    Difficulty of Paying Living Expenses: Not hard at all  Food Insecurity: No Food Insecurity (10/16/2021)   Hunger Vital Sign    Worried About Running Out of Food in the Last Year: Never true    Ran Out of Food in the Last Year: Never true  Transportation Needs: No Transportation Needs (10/16/2021)   PRAPARE - Hydrologist (Medical): No    Lack of Transportation (Non-Medical): No  Physical Activity: Sufficiently Active (10/16/2021)   Exercise Vital Sign    Days of Exercise per Week: 4 days    Minutes of Exercise per Session: 50 min  Stress: No Stress Concern Present (10/16/2021)   [place]    Feeling of Stress : Not at all  Social Connections: Socially Isolated (10/16/2021)   Social Connection and Isolation Panel [NHANES]    Frequency of Communication with Friends and Family: Twice a week    Frequency of Social Gatherings with Friends and Family: Twice a week    Attends Religious Services: Never    Marine scientist or Organizations: No    Attends Archivist Meetings: Never    Marital Status: Divorced  Human resources officer Violence: Not At Risk (10/16/2021)   Humiliation, Afraid, Rape, and Kick questionnaire    Fear of Current or Ex-Partner: No    Emotionally Abused: No    Physically Abused: No    Sexually Abused: No    Review of  Systems  Constitutional:  Negative for fatigue and unexpected weight change.  Eyes:  Negative for visual disturbance.  Respiratory:  Negative for cough, chest tightness and shortness of breath.   Cardiovascular:  Negative for chest pain, palpitations and leg swelling.  Gastrointestinal:  Negative for abdominal pain and blood in stool.  Neurological:  Negative for dizziness, light-headedness and headaches.     Objective:   Vitals:   07/30/22 0835  BP: (!) 118/58  Pulse: 73  Temp: 98 F (36.7 C)  SpO2: 95%  Weight: 195 lb 3.2 oz (88.5 kg)  Height: '5\' 6"'$  (1.676 m)     Physical Exam Vitals reviewed.  Constitutional:      Appearance: He is well-developed.  HENT:     Head: Normocephalic and atraumatic.  Neck:     Vascular: No carotid bruit or JVD.  Cardiovascular:     Rate and Rhythm: Normal rate and regular rhythm.     Heart sounds: Normal heart sounds. No murmur heard. Pulmonary:     Effort: Pulmonary effort is normal.     Breath sounds: Normal breath sounds. No rales.  Musculoskeletal:     Right lower leg: No edema.     Left lower leg: No edema.  Skin:    General: Skin is warm and dry.  Neurological:     Mental Status: He is alert and oriented to person, place, and time.   Psychiatric:        Mood and Affect: Mood normal.      Assessment & Plan:  Derrick Becker is a 75 y.o. male . Essential hypertension - Plan: Comprehensive metabolic panel, losartan-hydrochlorothiazide (HYZAAR) 100-12.5 MG tablet  -Borderline low readings, asymptomatic at this time.  Weight has improved some.  We will try lower dose of losartan HCTZ, continue same dose amlodipine for now.  Home monitoring with RTC precautions, advised to call or send message if readings are increasing.  Gout, unspecified cause, unspecified chronicity, unspecified site - Plan: Uric acid, colchicine 0.6 MG tablet  -Asymptomatic, colchicine sent to requested pharmacy for improved cost.  Check uric acid, continue allopurinol for prevention.  Type 2 diabetes mellitus with hyperglycemia, without long-term current use of insulin (HCC) - Plan: glipiZIDE (GLUCOTROL) 10 MG tablet, Microalbumin / creatinine urine ratio, Hemoglobin A1c  -Commended on improved weight.  Continue glipizide, metformin for now.  No dose changes at this time, hypoglycemia precautions given with use of glipizide.  62-monthfollow-up.  Hyperlipidemia, unspecified hyperlipidemia type - Plan: Comprehensive metabolic panel, Lipid panel  -Tolerating statin, continue same.  Check labs.   Meds ordered this encounter  Medications   glipiZIDE (GLUCOTROL) 10 MG tablet    Sig: TAKE ONE TABLET IN THE MORNING WITH MORNING MEAL AND 1 TABLET WITH EVENING MEAL.    Dispense:  180 tablet    Refill:  1   losartan-hydrochlorothiazide (HYZAAR) 100-12.5 MG tablet    Sig: Losartan/ HCTZ    Dispense:  90 tablet    Refill:  1   colchicine 0.6 MG tablet    Sig: 2 tablets once by mouth if gout flare, 1 additional tablet in 1 hour if not improved.    Dispense:  30 tablet    Refill:  2   Patient Instructions      Signed,   JMerri Ray MD [place] [facility] 07/30/22 9:28 AM

## 2022-07-30 NOTE — Patient Instructions (Addendum)
Blood pressure on the lower side today, try returning to the losartan HCTZ 100/12.5 mg dose.  Monitor your home blood pressure readings and if you see the start to increase, let me know.  No other med changes at this time.  Follow-up in 6 months.  Let me know if you need any refills prior to that time.  Take care

## 2022-08-03 ENCOUNTER — Telehealth: Payer: Self-pay | Admitting: Family Medicine

## 2022-08-03 NOTE — Telephone Encounter (Signed)
Caller name: Gustavus Bryant  On DPR?: NO   Call back number:  [phone]  Provider they see: [name], MD  Reason for call: Tandy Gaw called stating that they need direction for this medication losartan-hydrochlorothiazide 100-12.5 mg. Please resend with directions.

## 2022-08-04 ENCOUNTER — Other Ambulatory Visit: Payer: Self-pay

## 2022-08-04 DIAGNOSIS — I10 Essential (primary) hypertension: Secondary | ICD-10-CM

## 2022-08-04 MED ORDER — LOSARTAN POTASSIUM-HCTZ 100-12.5 MG PO TABS: 1.0000 | ORAL_TABLET | Freq: Every day | ORAL | 1 refills | Status: DC

## 2022-08-04 NOTE — Telephone Encounter (Signed)
Sent new Rx for once daily dosing

## 2022-08-12 ENCOUNTER — Telehealth: Payer: Self-pay | Admitting: Lab

## 2022-08-12 DIAGNOSIS — H25812 Combined forms of age-related cataract, left eye: Secondary | ICD-10-CM | POA: Diagnosis not present

## 2022-08-12 DIAGNOSIS — E119 Type 2 diabetes mellitus without complications: Secondary | ICD-10-CM | POA: Diagnosis not present

## 2022-08-12 DIAGNOSIS — H40013 Open angle with borderline findings, low risk, bilateral: Secondary | ICD-10-CM | POA: Diagnosis not present

## 2022-08-12 DIAGNOSIS — H4322 Crystalline deposits in vitreous body, left eye: Secondary | ICD-10-CM | POA: Diagnosis not present

## 2022-08-12 LAB — HM DIABETES EYE EXAM

## 2022-08-12 NOTE — Telephone Encounter (Signed)
Received screening from [facility] from the front and put it in your folder for review

## 2022-08-14 ENCOUNTER — Encounter: Payer: Self-pay | Admitting: Family Medicine

## 2022-08-18 ENCOUNTER — Encounter: Payer: Self-pay | Admitting: Family Medicine

## 2022-09-07 ENCOUNTER — Ambulatory Visit: Payer: Medicare HMO | Admitting: Adult Health

## 2022-09-07 ENCOUNTER — Encounter: Payer: Self-pay | Admitting: Adult Health

## 2022-09-07 VITALS — BP 131/65 | HR 76 | Ht 66.0 in | Wt 199.0 lb

## 2022-09-07 DIAGNOSIS — G4733 Obstructive sleep apnea (adult) (pediatric): Secondary | ICD-10-CM

## 2022-09-07 NOTE — Progress Notes (Signed)
PATIENT: Derrick Becker DOB: 07-08-1946  REASON FOR VISIT: follow up HISTORY FROM: patient  Chief Complaint  Patient presents with   Follow-up    Pt in 19 Pt here for CPAP f/u Pt states no questions or concerns for today's visit       HISTORY OF PRESENT ILLNESS: Today 09/07/22:  Derrick Becker is a 76 year old male with a history of OSA on CPAP.  He returns today for follow-up.  His CPAP report is below.  He states the CPAP is working well.  He is asking about the inspire device.  Explained the procedure to him however he deferred for now.    09/04/21: Derrick Becker is a 76 year old male with a history of obstructive sleep apnea on CPAP.  He returns today for follow-up.  His download is attached to this note.  He states that overall he is doing well.  He was having problems with a dry mouth but got a chinstrap and he is found that beneficial.  He is also increased his humidification on the machine.  He returns today for an evaluation.  HISTORY 09/04/20: I reviewed his AutoPap compliance data from 08/04/2020 through 09/02/2020, which is a total of 30 days, during which time he used his machine 26 days with percent use days greater than 4 hours at 83%, indicating very good compliance, average usage of 5 hours and 52 minutes, residual AHI at goal at 0.5/h, leak on the higher end with a 95th percentile at 21 L/min on a pressure range of 5 to 15 cm with EPR, 95th percentile of pressure at 9.2 cm. He reports that he had to weekend trips where he did not use his CPAP.  He is up-to-date with his supplies but is due for new shipment and his DME company is [employer].  He is doing well medically speaking, no new issues, no new medications.  His weight tends to fluctuate, little bit higher in the wintertime when he is less physically active.  He has no new concerns.    REVIEW OF SYSTEMS: Out of a complete 14 system review of symptoms, the patient complains only of the following symptoms, and all other  reviewed systems are negative.  FSS 19 ESS 5  ALLERGIES: Allergies  Allergen Reactions   No Known Allergies     HOME MEDICATIONS: Outpatient Medications Prior to Visit  Medication Sig Dispense Refill   allopurinol (ZYLOPRIM) 100 MG tablet TAKE 1 TABLET EVERY DAY 90 tablet 1   amLODipine (NORVASC) 10 MG tablet TAKE 1 TABLET EVERY DAY 90 tablet 1   aspirin EC 81 MG tablet Take 81 mg by mouth daily.     atorvastatin (LIPITOR) 80 MG tablet TAKE 1 TABLET EVERY DAY 90 tablet 1   colchicine 0.6 MG tablet 2 tablets once by mouth if gout flare, 1 additional tablet in 1 hour if not improved. 30 tablet 2   FLUZONE HIGH-DOSE QUADRIVALENT 0.7 ML SUSY      glipiZIDE (GLUCOTROL) 10 MG tablet TAKE ONE TABLET IN THE MORNING WITH MORNING MEAL AND 1 TABLET WITH EVENING MEAL. 180 tablet 1   losartan-hydrochlorothiazide (HYZAAR) 100-12.5 MG tablet Take 1 tablet by mouth daily. Losartan/ HCTZ 90 tablet 1   METFORMIN & DIET MANAGE PROD PO Metformin     metFORMIN (GLUCOPHAGE) 1000 MG tablet TAKE 1 TABLET TWICE DAILY WITH MEALS 180 tablet 1   Multiple Vitamins-Minerals (ONE DAILY MENS PO)      SHINGRIX injection  No facility-administered medications prior to visit.    PAST MEDICAL HISTORY: Past Medical History:  Diagnosis Date   Diabetes mellitus without complication ([place])    Hyperlipemia    Hypertension     PAST SURGICAL HISTORY: Past Surgical History:  Procedure Laterality Date   FRACTURE SURGERY     R forearm    FAMILY HISTORY: Family History  Problem Relation Age of Onset   Lung cancer Father    Kidney cancer Brother    Sleep apnea Brother    Sleep apnea Son     SOCIAL HISTORY: Social History   Socioeconomic History   Marital status: Divorced    Spouse name: Not on file   Number of children: 2   Years of education: College   Highest education level: Not on file  Occupational History   Not on file  Tobacco Use   Smoking status: Never   Smokeless tobacco: Never   Substance and Sexual Activity   Alcohol use: Yes    Comment: occ   Drug use: No   Sexual activity: Never    Birth control/protection: Abstinence  Other Topics Concern   Not on file  Social History Narrative   Not on file   Social Determinants of Health   Financial Resource Strain: Low Risk  (10/16/2021)   Overall Financial Resource Strain (CARDIA)    Difficulty of Paying Living Expenses: Not hard at all  Food Insecurity: No Food Insecurity (10/16/2021)   Hunger Vital Sign    Worried About Running Out of Food in the Last Year: Never true    [place] in the Last Year: Never true  Transportation Needs: No Transportation Needs (10/16/2021)   PRAPARE - Hydrologist (Medical): No    Lack of Transportation (Non-Medical): No  Physical Activity: Sufficiently Active (10/16/2021)   Exercise Vital Sign    Days of Exercise per Week: 4 days    Minutes of Exercise per Session: 50 min  Stress: No Stress Concern Present (10/16/2021)   [place]    Feeling of Stress : Not at all  Social Connections: Socially Isolated (10/16/2021)   Social Connection and Isolation Panel [NHANES]    Frequency of Communication with Friends and Family: Twice a week    Frequency of Social Gatherings with Friends and Family: Twice a week    Attends Religious Services: Never    Marine scientist or Organizations: No    Attends Archivist Meetings: Never    Marital Status: Divorced  Human resources officer Violence: Not At Risk (10/16/2021)   Humiliation, Afraid, Rape, and Kick questionnaire    Fear of Current or Ex-Partner: No    Emotionally Abused: No    Physically Abused: No    Sexually Abused: No      PHYSICAL EXAM  Vitals:   09/07/22 1249  BP: 131/65  Pulse: 76  Weight: 199 lb (90.3 kg)  Height: '5\' 6"'$  (1.676 m)   Body mass index is 32.12 kg/m.  Generalized: Well developed, in no acute  distress  Chest: Lungs clear to auscultation bilaterally  Neurological examination  Mentation: Alert oriented to time, place, history taking. Follows all commands speech and language fluent Cranial nerve II-XII: Extraocular movements were full, visual field were full on confrontational test Head turning and shoulder shrug  were normal and symmetric. Gait and station: Gait is normal.    DIAGNOSTIC DATA (LABS, IMAGING, TESTING) - I  reviewed patient records, labs, notes, testing and imaging myself where available.  Lab Results  Component Value Date   WBC 8.7 02/01/2017   HGB 14.0 (A) 02/01/2017   HCT 39.7 (A) 02/01/2017   MCV 92.9 02/01/2017      Component Value Date/Time   NA 138 07/30/2022 0948   NA 140 12/04/2020 0856   K 4.4 07/30/2022 0948   CL 104 07/30/2022 0948   CO2 26 07/30/2022 0948   GLUCOSE 128 (H) 07/30/2022 0948   BUN 19 07/30/2022 0948   BUN 22 12/04/2020 0856   CREATININE 1.04 07/30/2022 0948   CREATININE 0.83 02/17/2016 0934   CALCIUM 9.5 07/30/2022 0948   PROT 7.4 07/30/2022 0948   PROT 7.4 12/04/2020 0856   ALBUMIN 4.6 07/30/2022 0948   ALBUMIN 4.9 (H) 12/04/2020 0856   AST 21 07/30/2022 0948   ALT 18 07/30/2022 0948   ALKPHOS 51 07/30/2022 0948   BILITOT 0.4 07/30/2022 0948   BILITOT 0.3 12/04/2020 0856   GFRNONAA 61 06/05/2020 0835   GFRNONAA 89 02/17/2016 0934   GFRAA 71 06/05/2020 0835   GFRAA >89 02/17/2016 0934   Lab Results  Component Value Date   CHOL 125 07/30/2022   HDL 35.00 (L) 07/30/2022   LDLCALC 80 07/30/2022   TRIG 51.0 07/30/2022   CHOLHDL 4 07/30/2022   Lab Results  Component Value Date   HGBA1C 7.2 (H) 07/30/2022       ASSESSMENT AND PLAN 76 y.o. year old male  has a past medical history of Diabetes mellitus without complication ([place]), Hyperlipemia, and Hypertension. here with:  OSA on CPAP  - CPAP compliance good - Good treatment of AHI  - Encourage patient to use CPAP nightly and > 4 hours each night - F/U in  1 year or sooner if needed  Ward Givens, MSN, NP-C 09/07/2022, 1:00 PM [facility] [address] [place], [place] [address] [phone]

## 2022-11-02 ENCOUNTER — Other Ambulatory Visit: Payer: Self-pay | Admitting: Neurology

## 2022-11-02 DIAGNOSIS — G4733 Obstructive sleep apnea (adult) (pediatric): Secondary | ICD-10-CM

## 2022-11-24 ENCOUNTER — Ambulatory Visit (INDEPENDENT_AMBULATORY_CARE_PROVIDER_SITE_OTHER): Payer: Medicare HMO

## 2022-11-24 VITALS — Ht 66.0 in | Wt 199.0 lb

## 2022-11-24 DIAGNOSIS — Z0000 Encounter for general adult medical examination without abnormal findings: Secondary | ICD-10-CM | POA: Diagnosis not present

## 2022-11-24 NOTE — Patient Instructions (Signed)
Derrick Becker , Thank you for taking time to come for your Medicare Wellness Visit. I appreciate your ongoing commitment to your health goals. Please review the following plan we discussed and let me know if I can assist you in the future.   These are the goals we discussed:  Goals       Weight (lb) < 200 lb (90.7 kg) (pt-stated)      Keep walking / loose some weight.        This is a list of the screening recommended for you and due dates:  Health Maintenance  Topic Date Due   DTaP/Tdap/Td vaccine (1 - Tdap) Never done   COVID-19 Vaccine (4 - 2023-24 season) 12/10/2022*   Complete foot exam   01/20/2023   Hemoglobin A1C  01/29/2023   Yearly kidney function blood test for diabetes  07/31/2023   Yearly kidney health urinalysis for diabetes  07/31/2023   Eye exam for diabetics  08/13/2023   Medicare Annual Wellness Visit  11/25/2023   Pneumonia Vaccine  Completed   Hepatitis C Screening: USPSTF Recommendation to screen - Ages 18-79 yo.  Completed   HPV Vaccine  Aged Out   Flu Shot  Discontinued   Colon Cancer Screening  Discontinued   Zoster (Shingles) Vaccine  Discontinued  *Topic was postponed. The date shown is not the original due date.    Advanced directives: Please bring a copy of your health care power of attorney and living will to the office to be added to your chart at your convenience.   Conditions/risks identified: None  Next appointment: Follow up in one year for your annual wellness visit.    Preventive Care 73 Years and Older, Male  Preventive care refers to lifestyle choices and visits with your health care provider that can promote health and wellness. What does preventive care include? A yearly physical exam. This is also called an annual well check. Dental exams once or twice a year. Routine eye exams. Ask your health care provider how often you should have your eyes checked. Personal lifestyle choices, including: Daily care of your teeth and  gums. Regular physical activity. Eating a healthy diet. Avoiding tobacco and drug use. Limiting alcohol use. Practicing safe sex. Taking low doses of aspirin every day. Taking vitamin and mineral supplements as recommended by your health care provider. What happens during an annual well check? The services and screenings done by your health care provider during your annual well check will depend on your age, overall health, lifestyle risk factors, and family history of disease. Counseling  Your health care provider may ask you questions about your: Alcohol use. Tobacco use. Drug use. Emotional well-being. Home and relationship well-being. Sexual activity. Eating habits. History of falls. Memory and ability to understand (cognition). Work and work Statistician. Screening  You may have the following tests or measurements: Height, weight, and BMI. Blood pressure. Lipid and cholesterol levels. These may be checked every 5 years, or more frequently if you are over 60 years old. Skin check. Lung cancer screening. You may have this screening every year starting at age 33 if you have a 30-pack-year history of smoking and currently smoke or have quit within the past 15 years. Fecal occult blood test (FOBT) of the stool. You may have this test every year starting at age 12. Flexible sigmoidoscopy or colonoscopy. You may have a sigmoidoscopy every 5 years or a colonoscopy every 10 years starting at age 56. Prostate cancer screening. Recommendations will vary  depending on your family history and other risks. Hepatitis C blood test. Hepatitis B blood test. Sexually transmitted disease (STD) testing. Diabetes screening. This is done by checking your blood sugar (glucose) after you have not eaten for a while (fasting). You may have this done every 1-3 years. Abdominal aortic aneurysm (AAA) screening. You may need this if you are a current or former smoker. Osteoporosis. You may be screened  starting at age 15 if you are at high risk. Talk with your health care provider about your test results, treatment options, and if necessary, the need for more tests. Vaccines  Your health care provider may recommend certain vaccines, such as: Influenza vaccine. This is recommended every year. Tetanus, diphtheria, and acellular pertussis (Tdap, Td) vaccine. You may need a Td booster every 10 years. Zoster vaccine. You may need this after age 29. Pneumococcal 13-valent conjugate (PCV13) vaccine. One dose is recommended after age 24. Pneumococcal polysaccharide (PPSV23) vaccine. One dose is recommended after age 57. Talk to your health care provider about which screenings and vaccines you need and how often you need them. This information is not intended to replace advice given to you by your health care provider. Make sure you discuss any questions you have with your health care provider. Document Released: 10/18/2015 Document Revised: 06/10/2016 Document Reviewed: 07/23/2015 Elsevier Interactive Patient Education  2017 [employer] Prevention in the Home Falls can cause injuries. They can happen to people of all ages. There are many things you can do to make your home safe and to help prevent falls. What can I do on the outside of my home? Regularly fix the edges of walkways and driveways and fix any cracks. Remove anything that might make you trip as you walk through a door, such as a raised step or threshold. Trim any bushes or trees on the path to your home. Use bright outdoor lighting. Clear any walking paths of anything that might make someone trip, such as rocks or tools. Regularly check to see if handrails are loose or broken. Make sure that both sides of any steps have handrails. Any raised decks and porches should have guardrails on the edges. Have any leaves, snow, or ice cleared regularly. Use sand or salt on walking paths during winter. Clean up any spills in your garage  right away. This includes oil or grease spills. What can I do in the bathroom? Use night lights. Install grab bars by the toilet and in the tub and shower. Do not use towel bars as grab bars. Use non-skid mats or decals in the tub or shower. If you need to sit down in the shower, use a plastic, non-slip stool. Keep the floor dry. Clean up any water that spills on the floor as soon as it happens. Remove soap buildup in the tub or shower regularly. Attach bath mats securely with double-sided non-slip rug tape. Do not have throw rugs and other things on the floor that can make you trip. What can I do in the bedroom? Use night lights. Make sure that you have a light by your bed that is easy to reach. Do not use any sheets or blankets that are too big for your bed. They should not hang down onto the floor. Have a firm chair that has side arms. You can use this for support while you get dressed. Do not have throw rugs and other things on the floor that can make you trip. What can I do in the  kitchen? Clean up any spills right away. Avoid walking on wet floors. Keep items that you use a lot in easy-to-reach places. If you need to reach something above you, use a strong step stool that has a grab bar. Keep electrical cords out of the way. Do not use floor polish or wax that makes floors slippery. If you must use wax, use non-skid floor wax. Do not have throw rugs and other things on the floor that can make you trip. What can I do with my stairs? Do not leave any items on the stairs. Make sure that there are handrails on both sides of the stairs and use them. Fix handrails that are broken or loose. Make sure that handrails are as long as the stairways. Check any carpeting to make sure that it is firmly attached to the stairs. Fix any carpet that is loose or worn. Avoid having throw rugs at the top or bottom of the stairs. If you do have throw rugs, attach them to the floor with carpet tape. Make  sure that you have a light switch at the top of the stairs and the bottom of the stairs. If you do not have them, ask someone to add them for you. What else can I do to help prevent falls? Wear shoes that: Do not have high heels. Have rubber bottoms. Are comfortable and fit you well. Are closed at the toe. Do not wear sandals. If you use a stepladder: Make sure that it is fully opened. Do not climb a closed stepladder. Make sure that both sides of the stepladder are locked into place. Ask someone to hold it for you, if possible. Clearly mark and make sure that you can see: Any grab bars or handrails. First and last steps. Where the edge of each step is. Use tools that help you move around (mobility aids) if they are needed. These include: Canes. Walkers. Scooters. Crutches. Turn on the lights when you go into a dark area. Replace any light bulbs as soon as they burn out. Set up your furniture so you have a clear path. Avoid moving your furniture around. If any of your floors are uneven, fix them. If there are any pets around you, be aware of where they are. Review your medicines with your doctor. Some medicines can make you feel dizzy. This can increase your chance of falling. Ask your doctor what other things that you can do to help prevent falls. This information is not intended to replace advice given to you by your health care provider. Make sure you discuss any questions you have with your health care provider. Document Released: 07/18/2009 Document Revised: 02/27/2016 Document Reviewed: 10/26/2014 Elsevier Interactive Patient Education  2017 [employer].

## 2022-11-24 NOTE — Progress Notes (Signed)
Subjective:   Derrick Becker is a 77 y.o. male who presents for Medicare Annual/Subsequent preventive examination.  Review of Systems    Virtual Visit via Telephone Note  I connected with  Derrick Becker on 11/24/22 at 10:00 AM EST by telephone and verified that I am speaking with the correct person using two identifiers.  Location: Patient: Home Provider: Office Persons participating in the virtual visit: patient/Nurse Health Advisor   I discussed the limitations, risks, security and privacy concerns of performing an evaluation and management service by telephone and the availability of in person appointments. The patient expressed understanding and agreed to proceed.  Interactive audio and video telecommunications were attempted between this nurse and patient, however failed, due to patient having technical difficulties OR patient did not have access to video capability.  We continued and completed visit with audio only.  Some vital signs may be absent or patient reported.   Criselda Peaches, LPN  Cardiac Risk Factors include: advanced age (>76mn, >>63women);hypertension;male gender;diabetes mellitus     Objective:    Today's Vitals   11/24/22 0954  Weight: 199 lb (90.3 kg)  Height: 5' 6"$  (1.676 m)   Body mass index is 32.12 kg/m.     11/24/2022   10:00 AM 10/16/2021    9:15 AM 02/14/2020   10:05 AM 01/04/2019    9:05 AM 02/02/2017    1:34 PM 02/02/2017    1:33 PM 02/02/2017    9:53 AM  Advanced Directives  Does Patient Have a Medical Advance Directive? Yes No No No No Yes;No No  Type of AParamedicof [place] will        [place] of [place]? No - copy requested        Would patient like information on creating a medical advance directive?  No - Patient declined Yes (ED - Information included in AVS) No - Patient declined Yes (MAU/Ambulatory/Procedural Areas - Information given)  Yes (MAU/Ambulatory/Procedural Areas  - Information given)    Current Medications (verified) Outpatient Encounter Medications as of 11/24/2022  Medication Sig   allopurinol (ZYLOPRIM) 100 MG tablet TAKE 1 TABLET EVERY DAY   amLODipine (NORVASC) 10 MG tablet TAKE 1 TABLET EVERY DAY   aspirin EC 81 MG tablet Take 81 mg by mouth daily.   atorvastatin (LIPITOR) 80 MG tablet TAKE 1 TABLET EVERY DAY   colchicine 0.6 MG tablet 2 tablets once by mouth if gout flare, 1 additional tablet in 1 hour if not improved.   FLUZONE HIGH-DOSE QUADRIVALENT 0.7 ML SUSY    glipiZIDE (GLUCOTROL) 10 MG tablet TAKE ONE TABLET IN THE MORNING WITH MORNING MEAL AND 1 TABLET WITH EVENING MEAL.   losartan-hydrochlorothiazide (HYZAAR) 100-12.5 MG tablet Take 1 tablet by mouth daily. Losartan/ HCTZ   METFORMIN & DIET MANAGE PROD PO Metformin   metFORMIN (GLUCOPHAGE) 1000 MG tablet TAKE 1 TABLET TWICE DAILY WITH MEALS   Multiple Vitamins-Minerals (ONE DAILY MENS PO)    SHINGRIX injection    No facility-administered encounter medications on file as of 11/24/2022.    Allergies (verified) No known allergies   History: Past Medical History:  Diagnosis Date   Diabetes mellitus without complication ([place]    Hyperlipemia    Hypertension    Past Surgical History:  Procedure Laterality Date   FRACTURE SURGERY     R forearm   Family History  Problem Relation Age of Onset   Lung cancer Father  Kidney cancer Brother    Sleep apnea Brother    Sleep apnea Son    Social History   Socioeconomic History   Marital status: Divorced    Spouse name: Not on file   Number of children: 2   Years of education: College   Highest education level: Not on file  Occupational History   Not on file  Tobacco Use   Smoking status: Never   Smokeless tobacco: Never  Substance and Sexual Activity   Alcohol use: Yes    Comment: occ   Drug use: No   Sexual activity: Never    Birth control/protection: Abstinence  Other Topics Concern   Not on file  Social  History Narrative   Not on file   Social Determinants of Health   Financial Resource Strain: Low Risk  (11/24/2022)   Overall Financial Resource Strain (CARDIA)    Difficulty of Paying Living Expenses: Not hard at all  Food Insecurity: No Food Insecurity (11/24/2022)   Hunger Vital Sign    Worried About Running Out of Food in the Last Year: Never true    [place] in the Last Year: Never true  Transportation Needs: No Transportation Needs (11/24/2022)   PRAPARE - Hydrologist (Medical): No    Lack of Transportation (Non-Medical): No  Physical Activity: Sufficiently Active (11/24/2022)   Exercise Vital Sign    Days of Exercise per Week: 5 days    Minutes of Exercise per Session: 30 min  Stress: No Stress Concern Present (11/24/2022)   [place]    Feeling of Stress : Not at all  Social Connections: Socially Isolated (11/24/2022)   Social Connection and Isolation Panel [NHANES]    Frequency of Communication with Friends and Family: More than three times a week    Frequency of Social Gatherings with Friends and Family: More than three times a week    Attends Religious Services: Never    Marine scientist or Organizations: No    Attends Music therapist: Never    Marital Status: Divorced    Tobacco Counseling Counseling given: Not Answered   Clinical Intake:  Pre-visit preparation completed: Yes  Pain : No/denies pain   Nutrition Risk Assessment:  Has the patient had any N/V/D within the last 2 months?  No  Does the patient have any non-healing wounds?  No  Has the patient had any unintentional weight loss or weight gain?  No   Diabetes:  Is the patient diabetic?  Yes  If diabetic, was a CBG obtained today?  Yes CBG 128 Taken by patient Did the patient bring in their glucometer from home?  No  How often do you monitor your CBG's? Daily.   Financial  Strains and Diabetes Management:  Are you having any financial strains with the device, your supplies or your medication? No .  Does the patient want to be seen by Chronic Care Management for management of their diabetes?  No  Would the patient like to be referred to a Nutritionist or for Diabetic Management?  No   Diabetic Exams:  Diabetic Eye Exam: Completed No. Overdue for diabetic eye exam. Pt has been advised about the importance in completing this exam. A referral has been placed today. Message sent to referral coordinator for scheduling purposes. Advised pt to expect a call from office referred to regarding appt.  Diabetic Foot Exam: Completed No. Pt has  been advised about the importance in completing this exam. Pt is scheduled for diabetic foot exam on Followed by PCP.    BMI - recorded: 32.12 Nutritional Status: BMI > 30  Obese Nutritional Risks: None Diabetes: No  How often do you need to have someone help you when you read instructions, pamphlets, or other written materials from your doctor or pharmacy?: 1 - Never  Diabetic?  Yes  Interpreter Needed?: No  Information entered by :: Rolene Arbour LPN   Activities of Daily Living    11/24/2022    9:59 AM  In your present state of health, do you have any difficulty performing the following activities:  Hearing? 0  Vision? 0  Difficulty concentrating or making decisions? 0  Walking or climbing stairs? 0  Dressing or bathing? 0  Doing errands, shopping? 0  Preparing Food and eating ? N  Using the Toilet? N  In the past six months, have you accidently leaked urine? N  Do you have problems with loss of bowel control? N  Managing your Medications? N  Managing your Finances? N  Housekeeping or managing your Housekeeping? N    Patient Care Team: [name], MD as PCP - General (Family Medicine)  Indicate any recent Medical Services you may have received from other than [facility] providers in the past year (date may  be approximate).     Assessment:   This is a routine wellness examination for [place].  Hearing/Vision screen Hearing Screening - Comments:: Denies hearing difficulties   Vision Screening - Comments:: Wears rx glasses - up to date with routine eye exams with  Dr Katy Fitch  Dietary issues and exercise activities discussed: Current Exercise Habits: Home exercise routine, Type of exercise: walking, Time (Minutes): 30, Frequency (Times/Week): 5, Weekly Exercise (Minutes/Week): 150, Intensity: Moderate, Exercise limited by: None identified   Goals Addressed               This Visit's Progress     Weight (lb) < 200 lb (90.7 kg) (pt-stated)   199 lb (90.3 kg)     Keep walking / loose some weight.       Depression Screen    11/24/2022    9:58 AM 07/30/2022    8:32 AM 01/19/2022    9:01 AM 10/16/2021    9:18 AM 06/19/2021    8:31 AM 12/04/2020    8:13 AM 06/05/2020    8:08 AM  PHQ 2/9 Scores  PHQ - 2 Score 0 0 1 0 0 0 0  PHQ- 9 Score  0 1  1      Fall Risk    11/24/2022   10:00 AM 07/30/2022    8:32 AM 01/19/2022    9:01 AM 10/16/2021    9:16 AM 06/19/2021    8:30 AM  Fall Risk   Falls in the past year? 0 0 0 0 0  Number falls in past yr: 0 0 0 0 0  Injury with Fall? 0 0 0 0 0  Risk for fall due to : No Fall Risks No Fall Risks No Fall Risks  No Fall Risks  Follow up Falls prevention discussed Falls evaluation completed Falls evaluation completed Falls evaluation completed Falls evaluation completed    [place]:  Any stairs in or around the home? Yes  If so, are there any without handrails? No  Home free of loose throw rugs in walkways, pet beds, electrical cords, etc? Yes  Adequate lighting  in your home to reduce risk of falls? Yes   ASSISTIVE DEVICES UTILIZED TO PREVENT FALLS:  Life alert? No  Use of a cane, walker or w/c? No  Grab bars in the bathroom? No  Shower chair or bench in shower? No  Elevated toilet seat or a handicapped  toilet? No   TIMED UP AND GO:  Was the test performed? No . Audio Visit   Cognitive Function:        11/24/2022   10:00 AM 02/14/2020   10:05 AM 01/04/2019    9:08 AM  6CIT Screen  What Year? 0 points 0 points 0 points  What month? 0 points 0 points 0 points  What time? 0 points 0 points 0 points  Count back from 20 0 points 0 points 0 points  Months in reverse 0 points 0 points 0 points  Repeat phrase 0 points 0 points 0 points  Total Score 0 points 0 points 0 points    Immunizations Immunization History  Administered Date(s) Administered   Fluad Quad(high Dose 65+) 03/18/2022   Influenza Split 08/23/2012   Influenza, High Dose Seasonal PF 07/12/2018, 07/19/2019   Influenza,inj,Quad PF,6+ Mos 09/06/2017   Influenza-Unspecified 05/28/2021, 06/20/2021   Moderna Sars-Covid-2 Vaccination 12/15/2019, 01/15/2020, 08/14/2020   Pneumococcal Conjugate-13 02/02/2017   Pneumococcal Polysaccharide-23 03/14/2018   Zoster Recombinat (Shingrix) 03/18/2022    TDAP status: Due, Education has been provided regarding the importance of this vaccine. Advised may receive this vaccine at local pharmacy or Health Dept. Aware to provide a copy of the vaccination record if obtained from local pharmacy or Health Dept. Verbalized acceptance and understanding.  Flu Vaccine status: Up to date  Pneumococcal vaccine status: Up to date  Covid-19 vaccine status: Completed vaccines  Qualifies for Shingles Vaccine? Yes   Zostavax completed Yes   Shingrix Completed?: Yes  Screening Tests Health Maintenance  Topic Date Due   DTaP/Tdap/Td (1 - Tdap) Never done   COVID-19 Vaccine (4 - 2023-24 season) 12/10/2022 (Originally 06/05/2022)   FOOT EXAM  01/20/2023   HEMOGLOBIN A1C  01/29/2023   Diabetic kidney evaluation - eGFR measurement  07/31/2023   Diabetic kidney evaluation - Urine ACR  07/31/2023   OPHTHALMOLOGY EXAM  08/13/2023   Medicare Annual Wellness (AWV)  11/25/2023   Pneumonia Vaccine 57+  Years old  Completed   Hepatitis C Screening  Completed   HPV VACCINES  Aged Out   INFLUENZA VACCINE  Discontinued   COLONOSCOPY (Pts 45-88yr Insurance coverage will need to be confirmed)  Discontinued   Zoster Vaccines- Shingrix  Discontinued    Health Maintenance  Health Maintenance Due  Topic Date Due   DTaP/Tdap/Td (1 - Tdap) Never done    Colorectal cancer screening: No longer required.   Lung Cancer Screening: (Low Dose CT Chest recommended if Age 77-80years, 30 pack-year currently smoking OR have quit w/in 15years.) does not qualify.     Additional Screening:  Hepatitis C Screening: does qualify; Completed 09/06/17  Vision Screening: Recommended annual ophthalmology exams for early detection of glaucoma and other disorders of the eye. Is the patient up to date with their annual eye exam?  Yes  Who is the provider or what is the name of the office in which the patient attends annual eye exams? Dr  GKaty FitchIf pt is not established with a provider, would they like to be referred to a provider to establish care? No .   Dental Screening: Recommended annual dental exams for proper oral hygiene  Community Resource Referral / Chronic Care Management:  CRR required this visit?  No   CCM required this visit?  No      Plan:     I have personally reviewed and noted the following in the patient's chart:   Medical and social history Use of alcohol, tobacco or illicit drugs  Current medications and supplements including opioid prescriptions. Patient is not currently taking opioid prescriptions. Functional ability and status Nutritional status Physical activity Advanced directives List of other physicians Hospitalizations, surgeries, and ER visits in previous 12 months Vitals Screenings to include cognitive, depression, and falls Referrals and appointments  In addition, I have reviewed and discussed with patient certain preventive protocols, quality metrics, and best  practice recommendations. A written personalized care plan for preventive services as well as general preventive health recommendations were provided to patient.     Criselda Peaches, LPN   [id number]   Nurse Notes: None

## 2023-01-05 ENCOUNTER — Other Ambulatory Visit: Payer: Self-pay

## 2023-01-05 ENCOUNTER — Other Ambulatory Visit: Payer: Self-pay | Admitting: Family Medicine

## 2023-01-05 DIAGNOSIS — I10 Essential (primary) hypertension: Secondary | ICD-10-CM

## 2023-01-19 ENCOUNTER — Other Ambulatory Visit: Payer: Self-pay | Admitting: Family Medicine

## 2023-01-19 DIAGNOSIS — E1165 Type 2 diabetes mellitus with hyperglycemia: Secondary | ICD-10-CM

## 2023-02-04 ENCOUNTER — Encounter: Payer: Self-pay | Admitting: Family Medicine

## 2023-02-04 ENCOUNTER — Ambulatory Visit (INDEPENDENT_AMBULATORY_CARE_PROVIDER_SITE_OTHER): Payer: Medicare HMO | Admitting: Family Medicine

## 2023-02-04 VITALS — BP 122/54 | HR 75 | Temp 98.7°F | Ht 66.0 in | Wt 194.0 lb

## 2023-02-04 DIAGNOSIS — Z0000 Encounter for general adult medical examination without abnormal findings: Secondary | ICD-10-CM | POA: Diagnosis not present

## 2023-02-04 DIAGNOSIS — M109 Gout, unspecified: Secondary | ICD-10-CM | POA: Diagnosis not present

## 2023-02-04 DIAGNOSIS — E1165 Type 2 diabetes mellitus with hyperglycemia: Secondary | ICD-10-CM | POA: Diagnosis not present

## 2023-02-04 DIAGNOSIS — E785 Hyperlipidemia, unspecified: Secondary | ICD-10-CM

## 2023-02-04 DIAGNOSIS — I10 Essential (primary) hypertension: Secondary | ICD-10-CM | POA: Diagnosis not present

## 2023-02-04 DIAGNOSIS — M1A0790 Idiopathic chronic gout, unspecified ankle and foot, without tophus (tophi): Secondary | ICD-10-CM

## 2023-02-04 LAB — LIPID PANEL
Cholesterol: 129 mg/dL (ref 0–200)
HDL: 37.8 mg/dL — ABNORMAL LOW (ref >39.00)
LDL Cholesterol: 79 mg/dL (ref 0–99)
NonHDL: 90.76
Total CHOL/HDL Ratio: 3
Triglycerides: 57 mg/dL (ref 0.0–149.0)
VLDL: 11.4 mg/dL (ref 0.0–40.0)

## 2023-02-04 LAB — COMPREHENSIVE METABOLIC PANEL
ALT: 15 U/L (ref 0–53)
AST: 22 U/L (ref 0–37)
Albumin: 4.7 g/dL (ref 3.5–5.2)
Alkaline Phosphatase: 47 U/L (ref 39–117)
BUN: 20 mg/dL (ref 6–23)
CO2: 26 mEq/L (ref 19–32)
Calcium: 9.8 mg/dL (ref 8.4–10.5)
Chloride: 104 mEq/L (ref 96–112)
Creatinine, Ser: 1.03 mg/dL (ref 0.40–1.50)
GFR: 70.27 mL/min (ref >60.00)
Glucose, Bld: 132 mg/dL — ABNORMAL HIGH (ref 70–99)
Potassium: 4.7 mEq/L (ref 3.5–5.1)
Sodium: 139 mEq/L (ref 135–145)
Total Bilirubin: 0.5 mg/dL (ref 0.2–1.2)
Total Protein: 7.6 g/dL (ref 6.0–8.3)

## 2023-02-04 LAB — HEMOGLOBIN A1C: Hgb A1c MFr Bld: 6.6 % — ABNORMAL HIGH (ref 4.6–6.5)

## 2023-02-04 MED ORDER — AMLODIPINE BESYLATE 10 MG PO TABS: 10.0000 mg | ORAL_TABLET | Freq: Every day | ORAL | 1 refills | Status: DC

## 2023-02-04 MED ORDER — ALLOPURINOL 100 MG PO TABS: 100.0000 mg | ORAL_TABLET | Freq: Every day | ORAL | 1 refills | Status: DC

## 2023-02-04 MED ORDER — METFORMIN HCL 1000 MG PO TABS: ORAL_TABLET | ORAL | 1 refills | Status: DC

## 2023-02-04 MED ORDER — ATORVASTATIN CALCIUM 80 MG PO TABS: 80.0000 mg | ORAL_TABLET | Freq: Every day | ORAL | 1 refills | Status: DC

## 2023-02-04 NOTE — Patient Instructions (Addendum)
I do recommend dentist visit. Check with your insurance on covered providers.  Flu and covid vaccines in the fall.  No med changes for now. Take care!  Preventive Care 90 Years and Older, Male Preventive care refers to lifestyle choices and visits with your health care provider that can promote health and wellness. Preventive care visits are also called wellness exams. What can I expect for my preventive care visit? Counseling During your preventive care visit, your health care provider may ask about your: Medical history, including: Past medical problems. Family medical history. History of falls. Current health, including: Emotional well-being. Home life and relationship well-being. Sexual activity. Memory and ability to understand (cognition). Lifestyle, including: Alcohol, nicotine or tobacco, and drug use. Access to firearms. Diet, exercise, and sleep habits. Work and work Astronomer. Sunscreen use. Safety issues such as seatbelt and bike helmet use. Physical exam Your health care provider will check your: Height and weight. These may be used to calculate your BMI (body mass index). BMI is a measurement that tells if you are at a healthy weight. Waist circumference. This measures the distance around your waistline. This measurement also tells if you are at a healthy weight and may help predict your risk of certain diseases, such as type 2 diabetes and high blood pressure. Heart rate and blood pressure. Body temperature. Skin for abnormal spots. What immunizations do I need?  Vaccines are usually given at various ages, according to a schedule. Your health care provider will recommend vaccines for you based on your age, medical history, and lifestyle or other factors, such as travel or where you work. What tests do I need? Screening Your health care provider may recommend screening tests for certain conditions. This may include: Lipid and cholesterol levels. Diabetes  screening. This is done by checking your blood sugar (glucose) after you have not eaten for a while (fasting). Hepatitis C test. Hepatitis B test. HIV (human immunodeficiency virus) test. STI (sexually transmitted infection) testing, if you are at risk. Lung cancer screening. Colorectal cancer screening. Prostate cancer screening. Abdominal aortic aneurysm (AAA) screening. You may need this if you are a current or former smoker. Talk with your health care provider about your test results, treatment options, and if necessary, the need for more tests. Follow these instructions at home: Eating and drinking  Eat a diet that includes fresh fruits and vegetables, whole grains, lean protein, and low-fat dairy products. Limit your intake of foods with high amounts of sugar, saturated fats, and salt. Take vitamin and mineral supplements as recommended by your health care provider. Do not drink alcohol if your health care provider tells you not to drink. If you drink alcohol: Limit how much you have to 0-2 drinks a day. Know how much alcohol is in your drink. In the [place] one drink equals one 12 oz bottle of beer (355 mL), one 5 oz glass of wine (148 mL), or one 1 oz glass of hard liquor (44 mL). Lifestyle Brush your teeth every morning and night with fluoride toothpaste. Floss one time each day. Exercise for at least 30 minutes 5 or more days each week. Do not use any products that contain nicotine or tobacco. These products include cigarettes, chewing tobacco, and vaping devices, such as e-cigarettes. If you need help quitting, ask your health care provider. Do not use drugs. If you are sexually active, practice safe sex. Use a condom or other form of protection to prevent STIs. Take aspirin only as told by your health  care provider. Make sure that you understand how much to take and what form to take. Work with your health care provider to find out whether it is safe and beneficial for you to take  aspirin daily. Ask your health care provider if you need to take a cholesterol-lowering medicine (statin). Find healthy ways to manage stress, such as: Meditation, yoga, or listening to music. Journaling. Talking to a trusted person. Spending time with friends and family. Safety Always wear your seat belt while driving or riding in a vehicle. Do not drive: If you have been drinking alcohol. Do not ride with someone who has been drinking. When you are tired or distracted. While texting. If you have been using any mind-altering substances or drugs. Wear a helmet and other protective equipment during sports activities. If you have firearms in your house, make sure you follow all gun safety procedures. Minimize exposure to UV radiation to reduce your risk of skin cancer. What's next? Visit your health care provider once a year for an annual wellness visit. Ask your health care provider how often you should have your eyes and teeth checked. Stay up to date on all vaccines. This information is not intended to replace advice given to you by your health care provider. Make sure you discuss any questions you have with your health care provider. Document Revised: 03/19/2021 Document Reviewed: 03/19/2021 Elsevier Patient Education  2023 [employer].

## 2023-02-04 NOTE — Progress Notes (Signed)
Subjective:  Patient ID: Derrick Becker, male    DOB: 1946-07-21  Age: 77 y.o. MRN: [id number]  CC:  Chief Complaint  Patient presents with   Annual Exam    Pt has no concerns     HPI [name] presents for Annual Exam, no concerns or health changes.   Hypertension: Amlodipine 10 mg daily, losartan HCTZ 100/12.5 mg daily, no new side effects, not lightheaded or dizzy.  Home readings: 120's/60-70.  BP Readings from Last 3 Encounters:  02/04/23 (!) 122/54  09/07/22 131/65  07/30/22 (!) 118/58   Lab Results  Component Value Date   CREATININE 1.04 07/30/2022    Hyperlipidemia: Lipitor 80 mg daily. No new myalgias/side effects.  Lab Results  Component Value Date   CHOL 125 07/30/2022   HDL 35.00 (L) 07/30/2022   LDLCALC 80 07/30/2022   TRIG 51.0 07/30/2022   CHOLHDL 4 07/30/2022   Lab Results  Component Value Date   ALT 18 07/30/2022   AST 21 07/30/2022   ALKPHOS 51 07/30/2022   BILITOT 0.4 07/30/2022   Gout: Last flare: none recent.  Daily meds: Allopurinol 100 mg daily, no rash/side effects.  Prn med: Colchicine if needed. Lab Results  Component Value Date   LABURIC 5.7 07/30/2022    Diabetes: With hyperglycemia, microalbuminuria.  He is on ARB and statin. Metformin 1000 mg twice daily, glipizide 10 mg twice daily.  Weight down 5 pounds from previous visit. Home readings fasting: 95 today. No meds yet today.  Postprandial - improved lately with better meals. Rare 200.   Microalbumin: 5.5 on 07/30/2022 Optho, foot exam, pneumovax: Up-to-date  Wt Readings from Last 3 Encounters:  02/04/23 194 lb (88 kg)  11/24/22 199 lb (90.3 kg)  09/07/22 199 lb (90.3 kg)    Lab Results  Component Value Date   HGBA1C 7.2 (H) 07/30/2022   HGBA1C 7.2 (H) 01/19/2022   HGBA1C 7.5 (H) 06/19/2021   Lab Results  Component Value Date   MICROALBUR 5.5 (H) 07/30/2022   LDLCALC 80 07/30/2022   CREATININE 1.04 07/30/2022        02/04/2023    8:33 AM 11/24/2022     9:58 AM 07/30/2022    8:32 AM 01/19/2022    9:01 AM 10/16/2021    9:18 AM  Depression screen PHQ 2/9  Decreased Interest 0 0 0 1 0  Down, Depressed, Hopeless 0 0 0 0 0  PHQ - 2 Score 0 0 0 1 0  Altered sleeping 0  0 0   Tired, decreased energy 0  0 0   Change in appetite 0  0 0   Feeling bad or failure about yourself  0  0 0   Trouble concentrating 0  0 0   Moving slowly or fidgety/restless 0  0 0   Suicidal thoughts 0  0 0   PHQ-9 Score 0  0 1   Difficult doing work/chores Not difficult at all        Health Maintenance  Topic Date Due   DTaP/Tdap/Td (1 - Tdap) Never done   COVID-19 Vaccine (4 - 2023-24 season) 06/05/2022   HEMOGLOBIN A1C  01/29/2023   Diabetic kidney evaluation - eGFR measurement  07/31/2023   Diabetic kidney evaluation - Urine ACR  07/31/2023   OPHTHALMOLOGY EXAM  08/13/2023   Medicare Annual Wellness (AWV)  11/25/2023   FOOT EXAM  02/04/2024   Pneumonia Vaccine 35+ Years old  Completed   Hepatitis C Screening  Completed  HPV VACCINES  Aged Out   INFLUENZA VACCINE  Discontinued   COLONOSCOPY (Pts 45-48yrs Insurance coverage will need to be confirmed)  Discontinued   Zoster Vaccines- Shingrix  Discontinued  Colonoscopy 2016 - repeat 5 years, had more recent test - Dr. Loreta Ave. No FH of prostate CA, urinating ok. Defers prostate CA testing based on age.   Immunization History  Administered Date(s) Administered   Fluad Quad(high Dose 65+) 03/18/2022   Influenza Split 08/23/2012   Influenza, High Dose Seasonal PF 07/12/2018, 07/19/2019   Influenza,inj,Quad PF,6+ Mos 09/06/2017   Influenza-Unspecified 05/28/2021, 06/20/2021   Moderna Sars-Covid-2 Vaccination 12/15/2019, 01/15/2020, 08/14/2020   Pneumococcal Conjugate-13 02/02/2017   Pneumococcal Polysaccharide-23 03/14/2018   Zoster Recombinat (Shingrix) 03/18/2022  Had both shingrix- had at [facility].  Covid booster and flu vaccine recommended in fall.   No results found. Optho - 6 months ago and appt  next week.   Dental: due - few yrs ago.   Alcohol: rare - few months ago.   Tobacco: none  Exercise: 2-3 days per week. Walking 1-1.75miles. active at house.    History Patient Active Problem List   Diagnosis Date Noted   Sebaceous cyst 03/13/2019   Hyperlipidemia 10/12/2013   Diabetes (HCC) 08/23/2012   HTN (hypertension) 08/23/2012   Past Medical History:  Diagnosis Date   Diabetes mellitus without complication (HCC)    Hyperlipemia    Hypertension    Past Surgical History:  Procedure Laterality Date   FRACTURE SURGERY     R forearm   Allergies  Allergen Reactions   No Known Allergies    Prior to Admission medications   Medication Sig Start Date End Date Taking? Authorizing Provider  allopurinol (ZYLOPRIM) 100 MG tablet TAKE 1 TABLET EVERY DAY 06/29/22  Yes Shade Flood, MD  amLODipine (NORVASC) 10 MG tablet TAKE 1 TABLET EVERY DAY 06/29/22  Yes Shade Flood, MD  aspirin EC 81 MG tablet Take 81 mg by mouth daily.   Yes [provider]  atorvastatin (LIPITOR) 80 MG tablet TAKE 1 TABLET EVERY DAY 06/29/22  Yes Shade Flood, MD  colchicine 0.6 MG tablet 2 tablets once by mouth if gout flare, 1 additional tablet in 1 hour if not improved. 07/30/22  Yes Shade Flood, MD  FLUZONE HIGH-DOSE QUADRIVALENT 0.7 ML SUSY  06/18/22  Yes [provider]  glipiZIDE (GLUCOTROL) 10 MG tablet TAKE 1 TABLET IN THE MORNING WITH MORNING MEAL AND 1 TABLET WITH EVENING MEAL 01/19/23  Yes Shade Flood, MD  losartan-hydrochlorothiazide [facility]) 100-12.5 MG tablet TAKE 1 TABLET EVERY DAY 01/05/23  Yes Shade Flood, MD  METFORMIN & DIET MANAGE PROD PO Metformin   Yes [provider]  metFORMIN (GLUCOPHAGE) 1000 MG tablet TAKE 1 TABLET TWICE DAILY WITH MEALS 06/29/22  Yes Shade Flood, MD  Multiple Vitamins-Minerals (ONE DAILY MENS PO)  03/02/18  Yes [provider]  [facility] injection  04/27/22  Yes [provider]   Social  History   Socioeconomic History   Marital status: Divorced    Spouse name: Not on file   Number of children: 2   Years of education: College   Highest education level: Not on file  Occupational History   Not on file  Tobacco Use   Smoking status: Never   Smokeless tobacco: Never  Substance and Sexual Activity   Alcohol use: Yes    Comment: occ   Drug use: No   Sexual activity: Never  Birth control/protection: Abstinence  Other Topics Concern   Not on file  Social History Narrative   Not on file   Social Determinants of Health   Financial Resource Strain: Low Risk  (11/24/2022)   Overall Financial Resource Strain (CARDIA)    Difficulty of Paying Living Expenses: Not hard at all  Food Insecurity: No Food Insecurity (11/24/2022)   Hunger Vital Sign    Worried About Running Out of Food in the Last Year: Never true    Ran Out of Food in the Last Year: Never true  Transportation Needs: No Transportation Needs (11/24/2022)   PRAPARE - Administrator, Civil Service (Medical): No    Lack of Transportation (Non-Medical): No  Physical Activity: Sufficiently Active (11/24/2022)   Exercise Vital Sign    Days of Exercise per Week: 5 days    Minutes of Exercise per Session: 30 min  Stress: No Stress Concern Present (11/24/2022)   [employer] of Occupational Health - Occupational Stress Questionnaire    Feeling of Stress : Not at all  Social Connections: Socially Isolated (11/24/2022)   Social Connection and Isolation Panel [NHANES]    Frequency of Communication with Friends and Family: More than three times a week    Frequency of Social Gatherings with Friends and Family: More than three times a week    Attends Religious Services: Never    Database administrator or Organizations: No    Attends Banker Meetings: Never    Marital Status: Divorced  Catering manager Violence: Not At Risk (11/24/2022)   Humiliation, Afraid, Rape, and Kick questionnaire     Fear of Current or Ex-Partner: No    Emotionally Abused: No    Physically Abused: No    Sexually Abused: No    Review of Systems 13 point review of systems per patient health survey noted.  Negative other than as indicated above or in HPI.    Objective:   Vitals:   02/04/23 0831  BP: (!) 122/54  Pulse: 75  Temp: 98.7 F (37.1 C)  TempSrc: Temporal  SpO2: 95%  Weight: 194 lb (88 kg)  Height: 5\' 6"  (1.676 m)     Physical Exam Vitals reviewed.  Constitutional:      Appearance: He is well-developed.  HENT:     Head: Normocephalic and atraumatic.     Right Ear: External ear normal.     Left Ear: External ear normal.  Eyes:     Conjunctiva/sclera: Conjunctivae normal.     Pupils: Pupils are equal, round, and reactive to light.  Neck:     Thyroid: No thyromegaly.  Cardiovascular:     Rate and Rhythm: Normal rate and regular rhythm.     Heart sounds: Normal heart sounds.  Pulmonary:     Effort: Pulmonary effort is normal. No respiratory distress.     Breath sounds: Normal breath sounds. No wheezing.  Abdominal:     General: There is no distension.     Palpations: Abdomen is soft.     Tenderness: There is no abdominal tenderness.  Musculoskeletal:        General: No tenderness. Normal range of motion.     Cervical back: Normal range of motion and neck supple.  Lymphadenopathy:     Cervical: No cervical adenopathy.  Skin:    General: Skin is warm and dry.  Neurological:     Mental Status: He is alert and oriented to person, place, and time.  Deep Tendon Reflexes: Reflexes are normal and symmetric.  Psychiatric:        Behavior: Behavior normal.        Assessment & Plan:  SAHEJ SCHRIEBER is a 77 y.o. male . Annual physical exam  - -anticipatory guidance as below in AVS, screening labs above. Health maintenance items as above in HPI discussed/recommended as applicable.   Primary hypertension - Plan: Comprehensive metabolic panel  -Asymptomatic, with  borderline low diastolics.  Consider decreased dose and medication if lower readings at home or orthostatic/hypotension symptoms.  RTC precautions.  Type 2 diabetes mellitus with hyperglycemia, without long-term current use of insulin (HCC) - Plan: Hemoglobin A1c, metFORMIN (GLUCOPHAGE) 1000 MG tablet  -Weight down a few pounds, continue exercise and watching diet, no change in metformin/glipizide for now.  Check labs and adjustment and plan accordingly.  Hyperlipidemia, unspecified hyperlipidemia type - Plan: Lipid panel, atorvastatin (LIPITOR) 80 MG tablet  - Stable, tolerating current regimen. Medications refilled. Labs pending as above.   Chronic gout of ankle, unspecified cause, unspecified laterality - Plan: allopurinol (ZYLOPRIM) 100 MG tablet Gout, unspecified cause, unspecified chronicity, unspecified site  -Stable without recent flare, continue allopurinol.  Essential hypertension - Plan: amLODipine (NORVASC) 10 MG tablet  -As above, continue amlodipine, losartan HCT.  Meds ordered this encounter  Medications   allopurinol (ZYLOPRIM) 100 MG tablet    Sig: Take 1 tablet (100 mg total) by mouth daily.    Dispense:  90 tablet    Refill:  1   amLODipine (NORVASC) 10 MG tablet    Sig: Take 1 tablet (10 mg total) by mouth daily.    Dispense:  90 tablet    Refill:  1   atorvastatin (LIPITOR) 80 MG tablet    Sig: Take 1 tablet (80 mg total) by mouth daily.    Dispense:  90 tablet    Refill:  1   metFORMIN (GLUCOPHAGE) 1000 MG tablet    Sig: TAKE 1 TABLET TWICE DAILY WITH MEALS    Dispense:  180 tablet    Refill:  1   Patient Instructions  I do recommend dentist visit. Check with your insurance on covered providers.  Flu and covid vaccines in the fall.  No med changes for now. Take care!  Preventive Care 80 Years and Older, Male Preventive care refers to lifestyle choices and visits with your health care provider that can promote health and wellness. Preventive care visits  are also called wellness exams. What can I expect for my preventive care visit? Counseling During your preventive care visit, your health care provider may ask about your: Medical history, including: Past medical problems. Family medical history. History of falls. Current health, including: Emotional well-being. Home life and relationship well-being. Sexual activity. Memory and ability to understand (cognition). Lifestyle, including: Alcohol, nicotine or tobacco, and drug use. Access to firearms. Diet, exercise, and sleep habits. Work and work Astronomer. Sunscreen use. Safety issues such as seatbelt and bike helmet use. Physical exam Your health care provider will check your: Height and weight. These may be used to calculate your BMI (body mass index). BMI is a measurement that tells if you are at a healthy weight. Waist circumference. This measures the distance around your waistline. This measurement also tells if you are at a healthy weight and may help predict your risk of certain diseases, such as type 2 diabetes and high blood pressure. Heart rate and blood pressure. Body temperature. Skin for abnormal spots. What immunizations do I need?  Vaccines are usually given at various ages, according to a schedule. Your health care provider will recommend vaccines for you based on your age, medical history, and lifestyle or other factors, such as travel or where you work. What tests do I need? Screening Your health care provider may recommend screening tests for certain conditions. This may include: Lipid and cholesterol levels. Diabetes screening. This is done by checking your blood sugar (glucose) after you have not eaten for a while (fasting). Hepatitis C test. Hepatitis B test. HIV (human immunodeficiency virus) test. STI (sexually transmitted infection) testing, if you are at risk. Lung cancer screening. Colorectal cancer screening. Prostate cancer screening. Abdominal  aortic aneurysm (AAA) screening. You may need this if you are a current or former smoker. Talk with your health care provider about your test results, treatment options, and if necessary, the need for more tests. Follow these instructions at home: Eating and drinking  Eat a diet that includes fresh fruits and vegetables, whole grains, lean protein, and low-fat dairy products. Limit your intake of foods with high amounts of sugar, saturated fats, and salt. Take vitamin and mineral supplements as recommended by your health care provider. Do not drink alcohol if your health care provider tells you not to drink. If you drink alcohol: Limit how much you have to 0-2 drinks a day. Know how much alcohol is in your drink. In the [place] one drink equals one 12 oz bottle of beer (355 mL), one 5 oz glass of wine (148 mL), or one 1 oz glass of hard liquor (44 mL). Lifestyle Brush your teeth every morning and night with fluoride toothpaste. Floss one time each day. Exercise for at least 30 minutes 5 or more days each week. Do not use any products that contain nicotine or tobacco. These products include cigarettes, chewing tobacco, and vaping devices, such as e-cigarettes. If you need help quitting, ask your health care provider. Do not use drugs. If you are sexually active, practice safe sex. Use a condom or other form of protection to prevent STIs. Take aspirin only as told by your health care provider. Make sure that you understand how much to take and what form to take. Work with your health care provider to find out whether it is safe and beneficial for you to take aspirin daily. Ask your health care provider if you need to take a cholesterol-lowering medicine (statin). Find healthy ways to manage stress, such as: Meditation, yoga, or listening to music. Journaling. Talking to a trusted person. Spending time with friends and family. Safety Always wear your seat belt while driving or riding in a  vehicle. Do not drive: If you have been drinking alcohol. Do not ride with someone who has been drinking. When you are tired or distracted. While texting. If you have been using any mind-altering substances or drugs. Wear a helmet and other protective equipment during sports activities. If you have firearms in your house, make sure you follow all gun safety procedures. Minimize exposure to UV radiation to reduce your risk of skin cancer. What's next? Visit your health care provider once a year for an annual wellness visit. Ask your health care provider how often you should have your eyes and teeth checked. Stay up to date on all vaccines. This information is not intended to replace advice given to you by your health care provider. Make sure you discuss any questions you have with your health care provider. Document Revised: 03/19/2021 Document Reviewed: 03/19/2021 Elsevier Patient Education  2023 [employer] Inc[employer]       Signed,   Meredith Staggers, MD [facility], [facility] 02/04/23 9:28 AM

## 2023-02-10 DIAGNOSIS — H40013 Open angle with borderline findings, low risk, bilateral: Secondary | ICD-10-CM | POA: Diagnosis not present

## 2023-07-10 ENCOUNTER — Other Ambulatory Visit: Payer: Self-pay | Admitting: Family Medicine

## 2023-07-10 DIAGNOSIS — M1A0790 Idiopathic chronic gout, unspecified ankle and foot, without tophus (tophi): Secondary | ICD-10-CM

## 2023-07-10 DIAGNOSIS — I10 Essential (primary) hypertension: Secondary | ICD-10-CM

## 2023-07-10 DIAGNOSIS — E785 Hyperlipidemia, unspecified: Secondary | ICD-10-CM

## 2023-07-10 DIAGNOSIS — E1165 Type 2 diabetes mellitus with hyperglycemia: Secondary | ICD-10-CM

## 2023-08-11 ENCOUNTER — Telehealth: Payer: Self-pay | Admitting: Family Medicine

## 2023-08-11 ENCOUNTER — Ambulatory Visit: Payer: Medicare HMO | Admitting: Family Medicine

## 2023-08-11 NOTE — Telephone Encounter (Signed)
[facility] Order for CPAP machine   Charge sheet attached and placed in front bin

## 2023-08-12 NOTE — Telephone Encounter (Signed)
Placed in folder at [employer].

## 2023-08-13 NOTE — Telephone Encounter (Signed)
Paperwork completed and placed in fax bin at back nurse station

## 2023-08-17 ENCOUNTER — Encounter: Payer: Self-pay | Admitting: Family Medicine

## 2023-08-17 DIAGNOSIS — E119 Type 2 diabetes mellitus without complications: Secondary | ICD-10-CM | POA: Diagnosis not present

## 2023-08-17 DIAGNOSIS — Z961 Presence of intraocular lens: Secondary | ICD-10-CM | POA: Diagnosis not present

## 2023-08-17 DIAGNOSIS — H4322 Crystalline deposits in vitreous body, left eye: Secondary | ICD-10-CM | POA: Diagnosis not present

## 2023-08-17 DIAGNOSIS — H25812 Combined forms of age-related cataract, left eye: Secondary | ICD-10-CM | POA: Diagnosis not present

## 2023-08-17 DIAGNOSIS — H40013 Open angle with borderline findings, low risk, bilateral: Secondary | ICD-10-CM | POA: Diagnosis not present

## 2023-08-17 LAB — HM DIABETES EYE EXAM

## 2023-08-18 ENCOUNTER — Ambulatory Visit (INDEPENDENT_AMBULATORY_CARE_PROVIDER_SITE_OTHER): Payer: Medicare HMO | Admitting: Family Medicine

## 2023-08-18 ENCOUNTER — Encounter: Payer: Self-pay | Admitting: Family Medicine

## 2023-08-18 VITALS — BP 124/66 | HR 74 | Temp 98.1°F | Ht 66.0 in | Wt 191.6 lb

## 2023-08-18 DIAGNOSIS — E785 Hyperlipidemia, unspecified: Secondary | ICD-10-CM

## 2023-08-18 DIAGNOSIS — M109 Gout, unspecified: Secondary | ICD-10-CM

## 2023-08-18 DIAGNOSIS — Z7984 Long term (current) use of oral hypoglycemic drugs: Secondary | ICD-10-CM

## 2023-08-18 DIAGNOSIS — I10 Essential (primary) hypertension: Secondary | ICD-10-CM | POA: Diagnosis not present

## 2023-08-18 DIAGNOSIS — E1165 Type 2 diabetes mellitus with hyperglycemia: Secondary | ICD-10-CM

## 2023-08-18 LAB — COMPREHENSIVE METABOLIC PANEL
ALT: 14 U/L (ref 0–53)
AST: 20 U/L (ref 0–37)
Albumin: 4.6 g/dL (ref 3.5–5.2)
Alkaline Phosphatase: 50 U/L (ref 39–117)
BUN: 21 mg/dL (ref 6–23)
CO2: 26 meq/L (ref 19–32)
Calcium: 9.5 mg/dL (ref 8.4–10.5)
Chloride: 102 meq/L (ref 96–112)
Creatinine, Ser: 1.01 mg/dL (ref 0.40–1.50)
GFR: 71.67 mL/min (ref >60.00)
Glucose, Bld: 102 mg/dL — ABNORMAL HIGH (ref 70–99)
Potassium: 4.7 meq/L (ref 3.5–5.1)
Sodium: 137 meq/L (ref 135–145)
Total Bilirubin: 0.5 mg/dL (ref 0.2–1.2)
Total Protein: 7.1 g/dL (ref 6.0–8.3)

## 2023-08-18 LAB — LIPID PANEL
Cholesterol: 147 mg/dL (ref 0–200)
HDL: 35.2 mg/dL — ABNORMAL LOW (ref >39.00)
LDL Cholesterol: 97 mg/dL (ref 0–99)
NonHDL: 111.32
Total CHOL/HDL Ratio: 4
Triglycerides: 73 mg/dL (ref 0.0–149.0)
VLDL: 14.6 mg/dL (ref 0.0–40.0)

## 2023-08-18 LAB — URIC ACID: Uric Acid, Serum: 6.2 mg/dL (ref 4.0–7.8)

## 2023-08-18 LAB — MICROALBUMIN / CREATININE URINE RATIO
Creatinine,U: 55.7 mg/dL
Microalb Creat Ratio: 3.2 mg/g (ref 0.0–30.0)
Microalb, Ur: 1.8 mg/dL (ref 0.0–1.9)

## 2023-08-18 LAB — HEMOGLOBIN A1C: Hgb A1c MFr Bld: 6.8 % — ABNORMAL HIGH (ref 4.6–6.5)

## 2023-08-18 MED ORDER — LOSARTAN POTASSIUM-HCTZ 100-12.5 MG PO TABS: 1.0000 | ORAL_TABLET | Freq: Every day | ORAL | 3 refills | Status: DC

## 2023-08-18 MED ORDER — GLIPIZIDE 10 MG PO TABS
ORAL_TABLET | ORAL | 3 refills | Status: DC
Start: 2023-08-18 — End: 2024-05-31

## 2023-08-18 NOTE — Progress Notes (Signed)
Subjective:  Patient ID: Derrick Becker, male    DOB: [birth year]  Age: 77 y.o. MRN: [id number]  CC:  Chief Complaint  Patient presents with   Medical Management of Chronic Issues    Pt is doing well no concerns expressed     HPI Derrick Becker presents for   Diabetes:  With hyperglycemia, microalbuminuria, treated with metformin 1000 mg twice daily, glipizide 10 mg twice daily and improving weight at his last visit.  A1c had improved to 6.6.  No symptomatic lows at that time.  Continued on same regimen.  He is on ARB and statin as well.  He has lost another 3 pounds since last visit.  No new side effects with meds Home readings fasting - 108 - sometimes.  Home readings postprandial - not checking usually - 150 or so.  Symptomatic lows - none.  Microalbumin 5.5 on 07/30/2022, repeat planned today. Optho, foot exam, pneumovax: Up-to-date. Sister moved in with him - some increased desserts. Some dietary indiscretion.   Lab Results  Component Value Date   HGBA1C 6.6 (H) 02/04/2023   HGBA1C 7.2 (H) 07/30/2022   HGBA1C 7.2 (H) 01/19/2022   Lab Results  Component Value Date   MICROALBUR 5.5 (H) 07/30/2022   LDLCALC 79 02/04/2023   CREATININE 1.03 02/04/2023   Wt Readings from Last 3 Encounters:  08/18/23 191 lb 9.6 oz (86.9 kg)  02/04/23 194 lb (88 kg)  11/24/22 199 lb (90.3 kg)   Hypertension: Amlodipine 10 mg daily, losartan/HCTZ 100/12.5 mg daily without any side effects. Home readings:  117/68, 125/65-71  BP Readings from Last 3 Encounters:  08/18/23 124/66  02/04/23 (!) 122/54  09/07/22 131/65   Lab Results  Component Value Date   CREATININE 1.03 02/04/2023   Hyperlipidemia: Lipitor 80 mg daily without new myalgias or side effects. Lab Results  Component Value Date   CHOL 129 02/04/2023   HDL 37.80 (L) 02/04/2023   LDLCALC 79 02/04/2023   TRIG 57.0 02/04/2023   CHOLHDL 3 02/04/2023   Lab Results  Component Value Date   ALT 15 02/04/2023   AST 22  02/04/2023   ALKPHOS 47 02/04/2023   BILITOT 0.5 02/04/2023   Gout: Last flare: No recent flares. Daily meds: Allopurinol 100 mg daily.  No new side effects. Prn med: Has colchicine if needed for flare. Lab Results  Component Value Date   LABURIC 5.7 07/30/2022      History Patient Active Problem List   Diagnosis Date Noted   Sebaceous cyst 03/13/2019   Hyperlipidemia 10/12/2013   Diabetes (HCC) 08/23/2012   HTN (hypertension) 08/23/2012   Past Medical History:  Diagnosis Date   Diabetes mellitus without complication (HCC)    Hyperlipemia    Hypertension    Past Surgical History:  Procedure Laterality Date   FRACTURE SURGERY     R forearm   Allergies  Allergen Reactions   No Known Allergies    Prior to Admission medications   Medication Sig Start Date End Date Taking? Authorizing Provider  allopurinol (ZYLOPRIM) 100 MG tablet TAKE 1 TABLET EVERY DAY 07/12/23  Yes Shade Flood, MD  amLODipine (NORVASC) 10 MG tablet TAKE 1 TABLET EVERY DAY 07/12/23  Yes Shade Flood, MD  aspirin EC 81 MG tablet Take 81 mg by mouth daily.   Yes [provider]  atorvastatin (LIPITOR) 80 MG tablet TAKE 1 TABLET EVERY DAY 07/12/23  Yes Shade Flood, MD  colchicine 0.6 MG tablet 2 tablets  once by mouth if gout flare, 1 additional tablet in 1 hour if not improved. 07/30/22  Yes Shade Flood, MD  FLUZONE HIGH-DOSE QUADRIVALENT 0.7 ML SUSY  06/18/22  Yes [provider]  glipiZIDE (GLUCOTROL) 10 MG tablet TAKE 1 TABLET IN THE MORNING WITH MORNING MEAL AND 1 TABLET WITH EVENING MEAL 01/19/23  Yes Shade Flood, MD  losartan-hydrochlorothiazide [facility]) 100-12.5 MG tablet TAKE 1 TABLET EVERY DAY 01/05/23  Yes Shade Flood, MD  METFORMIN & DIET MANAGE PROD PO Metformin   Yes [provider]  metFORMIN (GLUCOPHAGE) 1000 MG tablet TAKE 1 TABLET TWICE DAILY WITH MEALS 07/12/23  Yes Shade Flood, MD  Multiple Vitamins-Minerals (ONE DAILY MENS PO)   03/02/18  Yes [provider]  [facility] injection  04/27/22  Yes [provider]   Social History   Socioeconomic History   Marital status: Divorced    Spouse name: Not on file   Number of children: 2   Years of education: College   Highest education level: Not on file  Occupational History   Not on file  Tobacco Use   Smoking status: Never   Smokeless tobacco: Never  Substance and Sexual Activity   Alcohol use: Yes    Comment: occ   Drug use: No   Sexual activity: Never    Birth control/protection: Abstinence  Other Topics Concern   Not on file  Social History Narrative   Not on file   Social Determinants of Health   Financial Resource Strain: Low Risk  (11/24/2022)   Overall Financial Resource Strain (CARDIA)    Difficulty of Paying Living Expenses: Not hard at all  Food Insecurity: No Food Insecurity (11/24/2022)   Hunger Vital Sign    Worried About Running Out of Food in the Last Year: Never true    Ran Out of Food in the Last Year: Never true  Transportation Needs: No Transportation Needs (11/24/2022)   PRAPARE - Transportation    Lack of Transportation (Medical): No    Lack of Transportation (Non-Medical): No  Physical Activity: Sufficiently Active (11/24/2022)   Exercise Vital Sign    Days of Exercise per Week: 5 days    Minutes of Exercise per Session: 30 min  Stress: No Stress Concern Present (11/24/2022)   [employer] of Occupational Health - Occupational Stress Questionnaire    Feeling of Stress : Not at all  Social Connections: Socially Isolated (11/24/2022)   Social Connection and Isolation Panel [NHANES]    Frequency of Communication with Friends and Family: More than three times a week    Frequency of Social Gatherings with Friends and Family: More than three times a week    Attends Religious Services: Never    Database administrator or Organizations: No    Attends Banker Meetings: Never    Marital Status: Divorced   Catering manager Violence: Not At Risk (11/24/2022)   Humiliation, Afraid, Rape, and Kick questionnaire    Fear of Current or Ex-Partner: No    Emotionally Abused: No    Physically Abused: No    Sexually Abused: No    Review of Systems  Constitutional:  Negative for fatigue and unexpected weight change.  Eyes:  Negative for visual disturbance.  Respiratory:  Negative for cough, chest tightness and shortness of breath.   Cardiovascular:  Negative for chest pain, palpitations and leg swelling.  Gastrointestinal:  Negative for abdominal pain and blood in stool.  Neurological:  Negative for dizziness,  light-headedness and headaches.     Objective:   Vitals:   08/18/23 0914  BP: 124/66  Pulse: 74  Temp: 98.1 F (36.7 C)  TempSrc: Temporal  SpO2: 96%  Weight: 191 lb 9.6 oz (86.9 kg)  Height: 5\' 6"  (1.676 m)     Physical Exam Vitals reviewed.  Constitutional:      Appearance: He is well-developed.  HENT:     Head: Normocephalic and atraumatic.  Neck:     Vascular: No carotid bruit or JVD.  Cardiovascular:     Rate and Rhythm: Normal rate and regular rhythm.     Heart sounds: Normal heart sounds. No murmur heard. Pulmonary:     Effort: Pulmonary effort is normal.     Breath sounds: Normal breath sounds. No rales.  Musculoskeletal:     Right lower leg: No edema.     Left lower leg: No edema.  Skin:    General: Skin is warm and dry.  Neurological:     Mental Status: He is alert and oriented to person, place, and time.  Psychiatric:        Mood and Affect: Mood normal.        Assessment & Plan:  Derrick Becker is a 77 y.o. male . Type 2 diabetes mellitus with hyperglycemia, without long-term current use of insulin (HCC) - Plan: Comprehensive metabolic panel, Hemoglobin A1c, Lipid panel, Microalbumin / creatinine urine ratio, glipiZIDE (GLUCOTROL) 10 MG tablet  -Improving A1c as of last visit.  Some intermittent dietary indiscretion but weight has been stable  and actually down a few pounds.  Check A1c and then discuss plan changes if needed.  Otherwise continue metformin, glipizide for now.  Option to change glipizide to other agent if decreasing control.  Denies hypoglycemia or side effects with current meds, will keep them the same for now.  Hyperlipidemia, unspecified hyperlipidemia type - Plan: Comprehensive metabolic panel, Lipid panel  -Tolerating current regimen, check labs and adjust plan accordingly.  28-month follow-up.  Gout, unspecified cause, unspecified chronicity, unspecified site - Plan: Uric acid  -No recent flare, continue allopurinol, colchicine if needed.  Check uric acid level.  Essential hypertension - Plan: losartan-hydrochlorothiazide (HYZAAR) 100-12.5 MG tablet  -Stable, tolerating current regimen, continue same, labs as above.  Meds ordered this encounter  Medications   glipiZIDE (GLUCOTROL) 10 MG tablet    Sig: TAKE 1 TABLET IN THE MORNING WITH MORNING MEAL AND 1 TABLET WITH EVENING MEAL    Dispense:  180 tablet    Refill:  3   losartan-hydrochlorothiazide (HYZAAR) 100-12.5 MG tablet    Sig: Take 1 tablet by mouth daily.    Dispense:  90 tablet    Refill:  3   Patient Instructions  No change in medications at this time.  Thanks for coming in today.  If any concerns on labs I will let you know and we can discuss medication changes if needed.  Otherwise follow-up in 6 months.  Take care!    Signed,   Meredith Staggers, MD [facility], [facility] 08/18/23 9:49 AM

## 2023-08-18 NOTE — Patient Instructions (Signed)
No change in medications at this time.  Thanks for coming in today.  If any concerns on labs I will let you know and we can discuss medication changes if needed.  Otherwise follow-up in 6 months.  Take care!

## 2023-09-06 ENCOUNTER — Ambulatory Visit: Payer: Medicare HMO | Admitting: Adult Health

## 2023-09-06 ENCOUNTER — Encounter: Payer: Self-pay | Admitting: Adult Health

## 2023-09-06 VITALS — BP 136/84 | HR 88 | Ht 66.0 in | Wt 199.0 lb

## 2023-09-06 DIAGNOSIS — G4733 Obstructive sleep apnea (adult) (pediatric): Secondary | ICD-10-CM

## 2023-09-06 NOTE — Progress Notes (Signed)
PATIENT: Derrick Becker DOB: 12-Feb-1946  REASON FOR VISIT: follow up HISTORY FROM: patient  Chief Complaint  Patient presents with   Follow-up    Pt in 19, here alone Pt Is here for yearly follow up on CPAP/OSA. Pt states he is doing well with CPAP. No concerns.       HISTORY OF PRESENT ILLNESS: Today 09/06/23:  Derrick Becker is a 77 y.o. male with a history of OSA on CPAP. Returns today for follow-up.  Reports that the CPAP is working well.  Denies any new issues.  His download is below      09/07/22:Mr. Derrick Becker is a 77 year old male with a history of OSA on CPAP.  He returns today for follow-up.  His CPAP report is below.  He states the CPAP is working well.  He is asking about the inspire device.  Explained the procedure to him however he deferred for now.    09/04/21: Mr. Derrick Becker is a 77 year old male with a history of obstructive sleep apnea on CPAP.  He returns today for follow-up.  His download is attached to this note.  He states that overall he is doing well.  He was having problems with a dry mouth but got a chinstrap and he is found that beneficial.  He is also increased his humidification on the machine.  He returns today for an evaluation.  HISTORY 09/04/20: I reviewed his AutoPap compliance data from 08/04/2020 through 09/02/2020, which is a total of 30 days, during which time he used his machine 26 days with percent use days greater than 4 hours at 83%, indicating very good compliance, average usage of 5 hours and 52 minutes, residual AHI at goal at 0.5/h, leak on the higher end with a 95th percentile at 21 L/min on a pressure range of 5 to 15 cm with EPR, 95th percentile of pressure at 9.2 cm. He reports that he had to weekend trips where he did not use his CPAP.  He is up-to-date with his supplies but is due for new shipment and his DME company is [employer].  He is doing well medically speaking, no new issues, no new medications.  His weight tends to fluctuate, little  bit higher in the wintertime when he is less physically active.  He has no new concerns.    REVIEW OF SYSTEMS: Out of a complete 14 system review of symptoms, the patient complains only of the following symptoms, and all other reviewed systems are negative.  FSS 11 ESS 7  ALLERGIES: Allergies  Allergen Reactions   No Known Allergies     HOME MEDICATIONS: Outpatient Medications Prior to Visit  Medication Sig Dispense Refill   allopurinol (ZYLOPRIM) 100 MG tablet TAKE 1 TABLET EVERY DAY 90 tablet 3   amLODipine (NORVASC) 10 MG tablet TAKE 1 TABLET EVERY DAY 90 tablet 3   aspirin EC 81 MG tablet Take 81 mg by mouth daily.     atorvastatin (LIPITOR) 80 MG tablet TAKE 1 TABLET EVERY DAY 90 tablet 3   colchicine 0.6 MG tablet 2 tablets once by mouth if gout flare, 1 additional tablet in 1 hour if not improved. 30 tablet 2   FLUZONE HIGH-DOSE QUADRIVALENT 0.7 ML SUSY      glipiZIDE (GLUCOTROL) 10 MG tablet TAKE 1 TABLET IN THE MORNING WITH MORNING MEAL AND 1 TABLET WITH EVENING MEAL 180 tablet 3   losartan-hydrochlorothiazide (HYZAAR) 100-12.5 MG tablet Take 1 tablet by mouth daily. 90 tablet 3  METFORMIN & DIET MANAGE PROD PO Metformin     metFORMIN (GLUCOPHAGE) 1000 MG tablet TAKE 1 TABLET TWICE DAILY WITH MEALS 180 tablet 3   Multiple Vitamins-Minerals (ONE DAILY MENS PO)      SHINGRIX injection      No facility-administered medications prior to visit.    PAST MEDICAL HISTORY: Past Medical History:  Diagnosis Date   Diabetes mellitus without complication (HCC)    Hyperlipemia    Hypertension     PAST SURGICAL HISTORY: Past Surgical History:  Procedure Laterality Date   FRACTURE SURGERY     R forearm    FAMILY HISTORY: Family History  Problem Relation Age of Onset   Lung cancer Father    Kidney cancer Brother    Sleep apnea Brother    Sleep apnea Son     SOCIAL HISTORY: Social History   Socioeconomic History   Marital status: Divorced    Spouse name: Not on  file   Number of children: 2   Years of education: College   Highest education level: Not on file  Occupational History   Not on file  Tobacco Use   Smoking status: Never   Smokeless tobacco: Never  Substance and Sexual Activity   Alcohol use: Yes    Comment: occ   Drug use: No   Sexual activity: Never    Birth control/protection: Abstinence  Other Topics Concern   Not on file  Social History Narrative   Not on file   Social Determinants of Health   Financial Resource Strain: Low Risk  (11/24/2022)   Overall Financial Resource Strain (CARDIA)    Difficulty of Paying Living Expenses: Not hard at all  Food Insecurity: No Food Insecurity (11/24/2022)   Hunger Vital Sign    Worried About Running Out of Food in the Last Year: Never true    Ran Out of Food in the Last Year: Never true  Transportation Needs: No Transportation Needs (11/24/2022)   PRAPARE - Administrator, Civil Service (Medical): No    Lack of Transportation (Non-Medical): No  Physical Activity: Sufficiently Active (11/24/2022)   Exercise Vital Sign    Days of Exercise per Week: 5 days    Minutes of Exercise per Session: 30 min  Stress: No Stress Concern Present (11/24/2022)   [employer] of Occupational Health - Occupational Stress Questionnaire    Feeling of Stress : Not at all  Social Connections: Socially Isolated (11/24/2022)   Social Connection and Isolation Panel [NHANES]    Frequency of Communication with Friends and Family: More than three times a week    Frequency of Social Gatherings with Friends and Family: More than three times a week    Attends Religious Services: Never    Database administrator or Organizations: No    Attends Banker Meetings: Never    Marital Status: Divorced  Catering manager Violence: Not At Risk (11/24/2022)   Humiliation, Afraid, Rape, and Kick questionnaire    Fear of Current or Ex-Partner: No    Emotionally Abused: No    Physically Abused: No     Sexually Abused: No      PHYSICAL EXAM  Vitals:   09/06/23 1254  BP: 136/84  Pulse: 88  Weight: 199 lb (90.3 kg)  Height: 5\' 6"  (1.676 m)    Body mass index is 32.12 kg/m.  Generalized: Well developed, in no acute distress  Chest: Lungs clear to auscultation bilaterally  Neurological examination  Mentation: Alert  oriented to time, place, history taking. Follows all commands speech and language fluent Cranial nerve II-XII: Facial symmetry noted   DIAGNOSTIC DATA (LABS, IMAGING, TESTING) - I reviewed patient records, labs, notes, testing and imaging myself where available.  Lab Results  Component Value Date   WBC 8.7 02/01/2017   HGB 14.0 (A) 02/01/2017   HCT 39.7 (A) 02/01/2017   MCV 92.9 02/01/2017      Component Value Date/Time   NA 137 08/18/2023 1000   NA 140 12/04/2020 0856   K 4.7 08/18/2023 1000   CL 102 08/18/2023 1000   CO2 26 08/18/2023 1000   GLUCOSE 102 (H) 08/18/2023 1000   BUN 21 08/18/2023 1000   BUN 22 12/04/2020 0856   CREATININE 1.01 08/18/2023 1000   CREATININE 0.83 02/17/2016 0934   CALCIUM 9.5 08/18/2023 1000   PROT 7.1 08/18/2023 1000   PROT 7.4 12/04/2020 0856   ALBUMIN 4.6 08/18/2023 1000   ALBUMIN 4.9 (H) 12/04/2020 0856   AST 20 08/18/2023 1000   ALT 14 08/18/2023 1000   ALKPHOS 50 08/18/2023 1000   BILITOT 0.5 08/18/2023 1000   BILITOT 0.3 12/04/2020 0856   GFRNONAA 61 06/05/2020 0835   GFRNONAA 89 02/17/2016 0934   GFRAA 71 06/05/2020 0835   GFRAA >89 02/17/2016 0934   Lab Results  Component Value Date   CHOL 147 08/18/2023   HDL 35.20 (L) 08/18/2023   LDLCALC 97 08/18/2023   TRIG 73.0 08/18/2023   CHOLHDL 4 08/18/2023   Lab Results  Component Value Date   HGBA1C 6.8 (H) 08/18/2023       ASSESSMENT AND PLAN 77 y.o. year old male  has a past medical history of Diabetes mellitus without complication (HCC), Hyperlipemia, and Hypertension. here with:  OSA on CPAP  - CPAP compliance good - Good treatment of  AHI  - Encourage patient to use CPAP nightly and > 4 hours each night - F/U in 1 year or sooner if needed  Butch Penny, MSN, NP-C 09/06/2023, 12:49 PM [facility] [address], [address] [place], [place] [address] [phone]

## 2023-12-14 ENCOUNTER — Ambulatory Visit (INDEPENDENT_AMBULATORY_CARE_PROVIDER_SITE_OTHER): Payer: Medicare HMO | Admitting: *Deleted

## 2023-12-14 DIAGNOSIS — Z0000 Encounter for general adult medical examination without abnormal findings: Secondary | ICD-10-CM

## 2023-12-14 NOTE — Progress Notes (Signed)
 Subjective:   Derrick Becker is a 78 y.o. male who presents for Medicare Annual/Subsequent preventive examination.  Visit Complete: Virtual I connected with  Derrick Becker on 12/14/23 by a audio enabled telemedicine application and verified that I am speaking with the correct person using two identifiers.  Patient Location: Home  Provider Location: Home Office  I discussed the limitations of evaluation and management by telemedicine. The patient expressed understanding and agreed to proceed.  Vital Signs: Because this visit was a virtual/telehealth visit, some criteria may be missing or patient reported. Any vitals not documented were not able to be obtained and vitals that have been documented are patient reported.   Cardiac Risk Factors include: advanced age (>76men, >41 women);diabetes mellitus;male gender;hypertension     Objective:    There were no vitals filed for this visit. There is no height or weight on file to calculate BMI.     12/14/2023    8:15 AM 11/24/2022   10:00 AM 10/16/2021    9:15 AM 02/14/2020   10:05 AM 01/04/2019    9:05 AM 02/02/2017    1:34 PM 02/02/2017    1:33 PM  Advanced Directives  Does Patient Have a Medical Advance Directive? Yes Yes No No No No Yes;No  Type of Estate agent of [employer] Power of [place];Living will       Copy of Healthcare Power of Attorney in Chart? No - copy requested No - copy requested       Would patient like information on creating a medical advance directive?   No - Patient declined Yes (ED - Information included in AVS) No - Patient declined Yes (MAU/Ambulatory/Procedural Areas - Information given)     Current Medications (verified) Outpatient Encounter Medications as of 12/14/2023  Medication Sig   allopurinol (ZYLOPRIM) 100 MG tablet TAKE 1 TABLET EVERY DAY   amLODipine (NORVASC) 10 MG tablet TAKE 1 TABLET EVERY DAY   aspirin EC 81 MG tablet Take 81 mg by mouth daily.   atorvastatin  (LIPITOR) 80 MG tablet TAKE 1 TABLET EVERY DAY   colchicine 0.6 MG tablet 2 tablets once by mouth if gout flare, 1 additional tablet in 1 hour if not improved.   FLUZONE HIGH-DOSE QUADRIVALENT 0.7 ML SUSY    glipiZIDE (GLUCOTROL) 10 MG tablet TAKE 1 TABLET IN THE MORNING WITH MORNING MEAL AND 1 TABLET WITH EVENING MEAL   losartan-hydrochlorothiazide (HYZAAR) 100-12.5 MG tablet Take 1 tablet by mouth daily.   METFORMIN & DIET MANAGE PROD PO Metformin   metFORMIN (GLUCOPHAGE) 1000 MG tablet TAKE 1 TABLET TWICE DAILY WITH MEALS   Multiple Vitamins-Minerals (ONE DAILY MENS PO)    SHINGRIX injection    No facility-administered encounter medications on file as of 12/14/2023.    Allergies (verified) No known allergies   History: Past Medical History:  Diagnosis Date   Diabetes mellitus without complication (HCC)    Hyperlipemia    Hypertension    Past Surgical History:  Procedure Laterality Date   FRACTURE SURGERY     R forearm   Family History  Problem Relation Age of Onset   Lung cancer Father    Kidney cancer Brother    Sleep apnea Brother    Sleep apnea Son    Social History   Socioeconomic History   Marital status: Divorced    Spouse name: Not on file   Number of children: 2   Years of education: College   Highest education level: Not on  file  Occupational History   Not on file  Tobacco Use   Smoking status: Never   Smokeless tobacco: Never  Substance and Sexual Activity   Alcohol use: Yes    Comment: occ   Drug use: No   Sexual activity: Never    Birth control/protection: Abstinence  Other Topics Concern   Not on file  Social History Narrative   Not on file   Social Drivers of Health   Financial Resource Strain: Low Risk  (12/14/2023)   Overall Financial Resource Strain (CARDIA)    Difficulty of Paying Living Expenses: Not very hard  Food Insecurity: No Food Insecurity (12/14/2023)   Hunger Vital Sign    Worried About Running Out of Food in the Last Year:  Never true    Ran Out of Food in the Last Year: Never true  Transportation Needs: No Transportation Needs (12/14/2023)   PRAPARE - Administrator, Civil Service (Medical): No    Lack of Transportation (Non-Medical): No  Physical Activity: Inactive (12/14/2023)   Exercise Vital Sign    Days of Exercise per Week: 0 days    Minutes of Exercise per Session: 0 min  Stress: No Stress Concern Present (12/14/2023)   [employer] of Occupational Health - Occupational Stress Questionnaire    Feeling of Stress : Not at all  Social Connections: Socially Isolated (12/14/2023)   Social Connection and Isolation Panel [NHANES]    Frequency of Communication with Friends and Family: More than three times a week    Frequency of Social Gatherings with Friends and Family: Twice a week    Attends Religious Services: Never    Database administrator or Organizations: No    Attends Engineer, structural: Never    Marital Status: Divorced    Tobacco Counseling Counseling given: Not Answered   Clinical Intake:  Pre-visit preparation completed: Yes  Pain : No/denies pain     Diabetes: Yes CBG done?: No Did pt. bring in CBG monitor from home?: No  How often do you need to have someone help you when you read instructions, pamphlets, or other written materials from your doctor or pharmacy?: 1 - Never  Interpreter Needed?: No  Information entered by :: Remi Haggard LPN   Activities of Daily Living    12/14/2023    8:15 AM  In your present state of health, do you have any difficulty performing the following activities:  Hearing? 0  Vision? 0  Difficulty concentrating or making decisions? 0  Walking or climbing stairs? 0  Dressing or bathing? 0  Doing errands, shopping? 0  Preparing Food and eating ? N  Using the Toilet? N  In the past six months, have you accidently leaked urine? N  Do you have problems with loss of bowel control? N  Managing your Medications? N   Managing your Finances? N  Housekeeping or managing your Housekeeping? N    Patient Care Team: Shade Flood, MD as PCP - General (Family Medicine)  Indicate any recent Medical Services you may have received from other than [facility] providers in the past year (date may be approximate).     Assessment:   This is a routine wellness examination for [place].  Hearing/Vision screen Hearing Screening - Comments:: No trouble hearing Vision Screening - Comments:: Up to date groat   Goals Addressed             This Visit's Progress    Increase physical activity  Depression Screen    12/14/2023    8:20 AM 08/18/2023    9:17 AM 08/18/2023    9:11 AM 02/04/2023    8:33 AM 11/24/2022    9:58 AM 07/30/2022    8:32 AM 01/19/2022    9:01 AM  PHQ 2/9 Scores  PHQ - 2 Score 0 0 0 0 0 0 1  PHQ- 9 Score 0 0 0 0  0 1    Fall Risk    12/14/2023    8:14 AM 08/18/2023    9:17 AM 08/18/2023    9:11 AM 02/04/2023    8:33 AM 11/24/2022   10:00 AM  Fall Risk   Falls in the past year? 0 0 0 0 0  Number falls in past yr: 0 0 0  0  Injury with Fall? 0 0 0  0  Risk for fall due to :  No Fall Risks No Fall Risks No Fall Risks No Fall Risks  Follow up Falls evaluation completed;Education provided;Falls prevention discussed Falls evaluation completed Falls evaluation completed  Falls prevention discussed    MEDICARE RISK AT HOME: Medicare Risk at Home Any stairs in or around the home?: Yes If so, are there any without handrails?: No Home free of loose throw rugs in walkways, pet beds, electrical cords, etc?: Yes Adequate lighting in your home to reduce risk of falls?: Yes Life alert?: No Use of a cane, walker or w/c?: No Grab bars in the bathroom?: No Shower chair or bench in shower?: No Elevated toilet seat or a handicapped toilet?: No  TIMED UP AND GO:  Was the test performed?  No    Cognitive Function:        12/14/2023    8:17 AM 11/24/2022   10:00 AM 02/14/2020   10:05  AM 01/04/2019    9:08 AM  6CIT Screen  What Year? 0 points 0 points 0 points 0 points  What month? 0 points 0 points 0 points 0 points  What time? 0 points 0 points 0 points 0 points  Count back from 20 2 points 0 points 0 points 0 points  Months in reverse 0 points 0 points 0 points 0 points  Repeat phrase 0 points 0 points 0 points 0 points  Total Score 2 points 0 points 0 points 0 points    Immunizations Immunization History  Administered Date(s) Administered   Fluad Quad(high Dose 65+) 03/18/2022   Influenza Split 08/23/2012   Influenza, High Dose Seasonal PF 07/12/2018, 07/19/2019   Influenza,inj,Quad PF,6+ Mos 09/06/2017   Influenza-Unspecified 05/28/2021, 06/20/2021, 07/07/2023   Moderna Sars-Covid-2 Vaccination 12/15/2019, 01/15/2020, 08/14/2020, 07/07/2023   Pneumococcal Conjugate-13 02/02/2017   Pneumococcal Polysaccharide-23 03/14/2018   Tdap 07/07/2023   Zoster Recombinant(Shingrix) 03/18/2022    TDAP status: Up to date  Flu Vaccine status: Up to date  Pneumococcal vaccine status: Up to date  Covid-19 vaccine status: Information provided on how to obtain vaccines.   Qualifies for Shingles Vaccine? Yes   Zostavax completed No   Shingrix Completed?: No.    Education has been provided regarding the importance of this vaccine. Patient has been advised to call insurance company to determine out of pocket expense if they have not yet received this vaccine. Advised may also receive vaccine at local pharmacy or Health Dept. Verbalized acceptance and understanding.  Screening Tests Health Maintenance  Topic Date Due   COVID-19 Vaccine (5 - 2024-25 season) 09/01/2023   FOOT EXAM  02/04/2024   HEMOGLOBIN A1C  02/15/2024  OPHTHALMOLOGY EXAM  08/16/2024   Diabetic kidney evaluation - eGFR measurement  08/17/2024   Diabetic kidney evaluation - Urine ACR  08/17/2024   Medicare Annual Wellness (AWV)  12/13/2024   Colonoscopy  09/22/2025   DTaP/Tdap/Td (2 - Td or Tdap)  07/06/2033   Pneumonia Vaccine 28+ Years old  Completed   INFLUENZA VACCINE  Completed   Hepatitis C Screening  Completed   HPV VACCINES  Aged Out   Zoster Vaccines- Shingrix  Discontinued    Health Maintenance  Health Maintenance Due  Topic Date Due   COVID-19 Vaccine (5 - 2024-25 season) 09/01/2023    Colorectal cancer screening: No longer required.   Lung Cancer Screening: (Low Dose CT Chest recommended if Age 35-80 years, 20 pack-year currently smoking OR have quit w/in 15years.) does not qualify.   Lung Cancer Screening Referral:   Additional Screening:  Hepatitis C Screening: does not qualify; Completed    Vision Screening: Recommended annual ophthalmology exams for early detection of glaucoma and other disorders of the eye. Is the patient up to date with their annual eye exam?  Yes  Who is the provider or what is the name of the office in which the patient attends annual eye exams? 2018 If pt is not established with a provider, would they like to be referred to a provider to establish care? No .   Dental Screening: Recommended annual dental exams for proper oral hygiene  Nutrition Risk Assessment:  Has the patient had any N/V/D within the last 2 months?  No  Does the patient have any non-healing wounds?  No  Has the patient had any unintentional weight loss or weight gain?  No   Diabetes:  Is the patient diabetic?  Yes  If diabetic, was a CBG obtained today?  No  Did the patient bring in their glucometer from home?  No  How often do you monitor your CBG's? 1 x a day.   Financial Strains and Diabetes Management:  Are you having any financial strains with the device, your supplies or your medication? No .  Does the patient want to be seen by Chronic Care Management for management of their diabetes?  No  Would the patient like to be referred to a Nutritionist or for Diabetic Management?  No   Diabetic Exams:  Diabetic Eye Exam: Completed . Marland Kitchen Pt has been  advised about the importance in completing this exam  Diabetic Foot Exam:  Pt has been advised about the importance in completing this exam.   Community Resource Referral / Chronic Care Management: CRR required this visit?  No   CCM required this visit?  No     Plan:     I have personally reviewed and noted the following in the patient's chart:   Medical and social history Use of alcohol, tobacco or illicit drugs  Current medications and supplements including opioid prescriptions. Patient is not currently taking opioid prescriptions. Functional ability and status Nutritional status Physical activity Advanced directives List of other physicians Hospitalizations, surgeries, and ER visits in previous 12 months Vitals Screenings to include cognitive, depression, and falls Referrals and appointments  In addition, I have reviewed and discussed with patient certain preventive protocols, quality metrics, and best practice recommendations. A written personalized care plan for preventive services as well as general preventive health recommendations were provided to patient.     Remi Haggard, LPN   [id number]   After Visit Summary: (MyChart) Due to this being a telephonic visit, the  after visit summary with patients personalized plan was offered to patient via MyChart   Nurse Notes:

## 2023-12-14 NOTE — Patient Instructions (Signed)
 Derrick Becker , Thank you for taking time to come for your Medicare Wellness Visit. I appreciate your ongoing commitment to your health goals. Please review the following plan we discussed and let me know if I can assist you in the future.   Screening recommendations/referrals: Colonoscopy: no longer required Recommended yearly ophthalmology/optometry visit for glaucoma screening and checkup Recommended yearly dental visit for hygiene and checkup  Vaccinations: Influenza vaccine: up to date Pneumococcal vaccine: up to date Tdap vaccine: up to date Shingles vaccine: Education provided    Advanced directives: yes    Preventive Care 65 Years and Older, Male Preventive care refers to lifestyle choices and visits with your health care provider that can promote health and wellness. What does preventive care include? A yearly physical exam. This is also called an annual well check. Dental exams once or twice a year. Routine eye exams. Ask your health care provider how often you should have your eyes checked. Personal lifestyle choices, including: Daily care of your teeth and gums. Regular physical activity. Eating a healthy diet. Avoiding tobacco and drug use. Limiting alcohol use. Practicing safe sex. Taking low doses of aspirin every day. Taking vitamin and mineral supplements as recommended by your health care provider. What happens during an annual well check? The services and screenings done by your health care provider during your annual well check will depend on your age, overall health, lifestyle risk factors, and family history of disease. Counseling  Your health care provider may ask you questions about your: Alcohol use. Tobacco use. Drug use. Emotional well-being. Home and relationship well-being. Sexual activity. Eating habits. History of falls. Memory and ability to understand (cognition). Work and work Astronomer. Screening  You may have the following tests or  measurements: Height, weight, and BMI. Blood pressure. Lipid and cholesterol levels. These may be checked every 5 years, or more frequently if you are over 72 years old. Skin check. Lung cancer screening. You may have this screening every year starting at age 40 if you have a 30-pack-year history of smoking and currently smoke or have quit within the past 15 years. Fecal occult blood test (FOBT) of the stool. You may have this test every year starting at age 61. Flexible sigmoidoscopy or colonoscopy. You may have a sigmoidoscopy every 5 years or a colonoscopy every 10 years starting at age 78. Prostate cancer screening. Recommendations will vary depending on your family history and other risks. Hepatitis C blood test. Hepatitis B blood test. Sexually transmitted disease (STD) testing. Diabetes screening. This is done by checking your blood sugar (glucose) after you have not eaten for a while (fasting). You may have this done every 1-3 years. Abdominal aortic aneurysm (AAA) screening. You may need this if you are a current or former smoker. Osteoporosis. You may be screened starting at age 28 if you are at high risk. Talk with your health care provider about your test results, treatment options, and if necessary, the need for more tests. Vaccines  Your health care provider may recommend certain vaccines, such as: Influenza vaccine. This is recommended every year. Tetanus, diphtheria, and acellular pertussis (Tdap, Td) vaccine. You may need a Td booster every 10 years. Zoster vaccine. You may need this after age 85. Pneumococcal 13-valent conjugate (PCV13) vaccine. One dose is recommended after age 85. Pneumococcal polysaccharide (PPSV23) vaccine. One dose is recommended after age 58. Talk to your health care provider about which screenings and vaccines you need and how often you need them. This information  is not intended to replace advice given to you by your health care provider. Make sure  you discuss any questions you have with your health care provider. Document Released: 10/18/2015 Document Revised: 06/10/2016 Document Reviewed: 07/23/2015 Elsevier Interactive Patient Education  2017 [employer].  Fall Prevention in the Home Falls can cause injuries. They can happen to people of all ages. There are many things you can do to make your home safe and to help prevent falls. What can I do on the outside of my home? Regularly fix the edges of walkways and driveways and fix any cracks. Remove anything that might make you trip as you walk through a door, such as a raised step or threshold. Trim any bushes or trees on the path to your home. Use bright outdoor lighting. Clear any walking paths of anything that might make someone trip, such as rocks or tools. Regularly check to see if handrails are loose or broken. Make sure that both sides of any steps have handrails. Any raised decks and porches should have guardrails on the edges. Have any leaves, snow, or ice cleared regularly. Use sand or salt on walking paths during winter. Clean up any spills in your garage right away. This includes oil or grease spills. What can I do in the bathroom? Use night lights. Install grab bars by the toilet and in the tub and shower. Do not use towel bars as grab bars. Use non-skid mats or decals in the tub or shower. If you need to sit down in the shower, use a plastic, non-slip stool. Keep the floor dry. Clean up any water that spills on the floor as soon as it happens. Remove soap buildup in the tub or shower regularly. Attach bath mats securely with double-sided non-slip rug tape. Do not have throw rugs and other things on the floor that can make you trip. What can I do in the bedroom? Use night lights. Make sure that you have a light by your bed that is easy to reach. Do not use any sheets or blankets that are too big for your bed. They should not hang down onto the floor. Have a firm  chair that has side arms. You can use this for support while you get dressed. Do not have throw rugs and other things on the floor that can make you trip. What can I do in the kitchen? Clean up any spills right away. Avoid walking on wet floors. Keep items that you use a lot in easy-to-reach places. If you need to reach something above you, use a strong step stool that has a grab bar. Keep electrical cords out of the way. Do not use floor polish or wax that makes floors slippery. If you must use wax, use non-skid floor wax. Do not have throw rugs and other things on the floor that can make you trip. What can I do with my stairs? Do not leave any items on the stairs. Make sure that there are handrails on both sides of the stairs and use them. Fix handrails that are broken or loose. Make sure that handrails are as long as the stairways. Check any carpeting to make sure that it is firmly attached to the stairs. Fix any carpet that is loose or worn. Avoid having throw rugs at the top or bottom of the stairs. If you do have throw rugs, attach them to the floor with carpet tape. Make sure that you have a light switch at the top of  the stairs and the bottom of the stairs. If you do not have them, ask someone to add them for you. What else can I do to help prevent falls? Wear shoes that: Do not have high heels. Have rubber bottoms. Are comfortable and fit you well. Are closed at the toe. Do not wear sandals. If you use a stepladder: Make sure that it is fully opened. Do not climb a closed stepladder. Make sure that both sides of the stepladder are locked into place. Ask someone to hold it for you, if possible. Clearly mark and make sure that you can see: Any grab bars or handrails. First and last steps. Where the edge of each step is. Use tools that help you move around (mobility aids) if they are needed. These include: Canes. Walkers. Scooters. Crutches. Turn on the lights when you go  into a dark area. Replace any light bulbs as soon as they burn out. Set up your furniture so you have a clear path. Avoid moving your furniture around. If any of your floors are uneven, fix them. If there are any pets around you, be aware of where they are. Review your medicines with your doctor. Some medicines can make you feel dizzy. This can increase your chance of falling. Ask your doctor what other things that you can do to help prevent falls. This information is not intended to replace advice given to you by your health care provider. Make sure you discuss any questions you have with your health care provider. Document Released: 07/18/2009 Document Revised: 02/27/2016 Document Reviewed: 10/26/2014 Elsevier Interactive Patient Education  2017 [employer].

## 2024-02-15 DIAGNOSIS — H25812 Combined forms of age-related cataract, left eye: Secondary | ICD-10-CM | POA: Diagnosis not present

## 2024-02-15 DIAGNOSIS — H40013 Open angle with borderline findings, low risk, bilateral: Secondary | ICD-10-CM | POA: Diagnosis not present

## 2024-02-15 DIAGNOSIS — H4322 Crystalline deposits in vitreous body, left eye: Secondary | ICD-10-CM | POA: Diagnosis not present

## 2024-02-15 DIAGNOSIS — Z961 Presence of intraocular lens: Secondary | ICD-10-CM | POA: Diagnosis not present

## 2024-02-15 DIAGNOSIS — Z860100 Personal history of colon polyps, unspecified: Secondary | ICD-10-CM | POA: Insufficient documentation

## 2024-02-15 DIAGNOSIS — E119 Type 2 diabetes mellitus without complications: Secondary | ICD-10-CM | POA: Diagnosis not present

## 2024-02-16 ENCOUNTER — Ambulatory Visit (INDEPENDENT_AMBULATORY_CARE_PROVIDER_SITE_OTHER): Payer: Medicare HMO | Admitting: Family Medicine

## 2024-02-16 ENCOUNTER — Encounter: Payer: Self-pay | Admitting: Family Medicine

## 2024-02-16 VITALS — BP 120/54 | HR 77 | Temp 97.9°F | Ht 66.0 in | Wt 191.4 lb

## 2024-02-16 DIAGNOSIS — Z0000 Encounter for general adult medical examination without abnormal findings: Secondary | ICD-10-CM

## 2024-02-16 DIAGNOSIS — E1165 Type 2 diabetes mellitus with hyperglycemia: Secondary | ICD-10-CM

## 2024-02-16 DIAGNOSIS — Z7984 Long term (current) use of oral hypoglycemic drugs: Secondary | ICD-10-CM

## 2024-02-16 DIAGNOSIS — I10 Essential (primary) hypertension: Secondary | ICD-10-CM

## 2024-02-16 DIAGNOSIS — M109 Gout, unspecified: Secondary | ICD-10-CM | POA: Diagnosis not present

## 2024-02-16 DIAGNOSIS — E785 Hyperlipidemia, unspecified: Secondary | ICD-10-CM

## 2024-02-16 LAB — COMPREHENSIVE METABOLIC PANEL WITH GFR
ALT: 15 U/L (ref 0–53)
AST: 20 U/L (ref 0–37)
Albumin: 4.6 g/dL (ref 3.5–5.2)
Alkaline Phosphatase: 51 U/L (ref 39–117)
BUN: 22 mg/dL (ref 6–23)
CO2: 27 meq/L (ref 19–32)
Calcium: 9.5 mg/dL (ref 8.4–10.5)
Chloride: 102 meq/L (ref 96–112)
Creatinine, Ser: 1.05 mg/dL (ref 0.40–1.50)
GFR: 68.17 mL/min (ref >60.00)
Glucose, Bld: 109 mg/dL — ABNORMAL HIGH (ref 70–99)
Potassium: 4.3 meq/L (ref 3.5–5.1)
Sodium: 138 meq/L (ref 135–145)
Total Bilirubin: 0.5 mg/dL (ref 0.2–1.2)
Total Protein: 7.3 g/dL (ref 6.0–8.3)

## 2024-02-16 LAB — LIPID PANEL
Cholesterol: 132 mg/dL (ref 0–200)
HDL: 38.8 mg/dL — ABNORMAL LOW (ref >39.00)
LDL Cholesterol: 82 mg/dL (ref 0–99)
NonHDL: 93
Total CHOL/HDL Ratio: 3
Triglycerides: 57 mg/dL (ref 0.0–149.0)
VLDL: 11.4 mg/dL (ref 0.0–40.0)

## 2024-02-16 LAB — HEMOGLOBIN A1C: Hgb A1c MFr Bld: 7.4 % — ABNORMAL HIGH (ref 4.6–6.5)

## 2024-02-16 NOTE — Patient Instructions (Addendum)
 I do not see a colonoscopy since 2016, and at that time they recommended repeat test in 5 years. Please call their office (Dr. Tova Fresh is the gastroenterologist who performed your last test) to see if you are due for repeat: Address: [address], [place], [place] [address] Phone: [phone]  No med changes today, if any lab concerns I will let you know. Tingling in feet may be due to diabetes. If that is more persistent, let me know as there are med options to treat this.   Health Maintenance After Age 15 After age 30, you are at a higher risk for certain long-term diseases and infections as well as injuries from falls. Falls are a major cause of broken bones and head injuries in people who are older than age 55. Getting regular preventive care can help to keep you healthy and well. Preventive care includes getting regular testing and making lifestyle changes as recommended by your health care provider. Talk with your health care provider about: Which screenings and tests you should have. A screening is a test that checks for a disease when you have no symptoms. A diet and exercise plan that is right for you. What should I know about screenings and tests to prevent falls? Screening and testing are the best ways to find a health problem early. Early diagnosis and treatment give you the best chance of managing medical conditions that are common after age 39. Certain conditions and lifestyle choices may make you more likely to have a fall. Your health care provider may recommend: Regular vision checks. Poor vision and conditions such as cataracts can make you more likely to have a fall. If you wear glasses, make sure to get your prescription updated if your vision changes. Medicine review. Work with your health care provider to regularly review all of the medicines you are taking, including over-the-counter medicines. Ask your health care provider about any side effects that may make you more  likely to have a fall. Tell your health care provider if any medicines that you take make you feel dizzy or sleepy. Strength and balance checks. Your health care provider may recommend certain tests to check your strength and balance while standing, walking, or changing positions. Foot health exam. Foot pain and numbness, as well as not wearing proper footwear, can make you more likely to have a fall. Screenings, including: Osteoporosis screening. Osteoporosis is a condition that causes the bones to get weaker and break more easily. Blood pressure screening. Blood pressure changes and medicines to control blood pressure can make you feel dizzy. Depression screening. You may be more likely to have a fall if you have a fear of falling, feel depressed, or feel unable to do activities that you used to do. Alcohol use screening. Using too much alcohol can affect your balance and may make you more likely to have a fall. Follow these instructions at home: Lifestyle Do not drink alcohol if: Your health care provider tells you not to drink. If you drink alcohol: Limit how much you have to: 0-1 drink a day for women. 0-2 drinks a day for men. Know how much alcohol is in your drink. In the [place] one drink equals one 12 oz bottle of beer (355 mL), one 5 oz glass of wine (148 mL), or one 1 oz glass of hard liquor (44 mL). Do not use any products that contain nicotine or tobacco. These products include cigarettes, chewing tobacco, and vaping devices, such as e-cigarettes. If  you need help quitting, ask your health care provider. Activity  Follow a regular exercise program to stay fit. This will help you maintain your balance. Ask your health care provider what types of exercise are appropriate for you. If you need a cane or walker, use it as recommended by your health care provider. Wear supportive shoes that have nonskid soles. Safety  Remove any tripping hazards, such as rugs, cords, and  clutter. Install safety equipment such as grab bars in bathrooms and safety rails on stairs. Keep rooms and walkways well-lit. General instructions Talk with your health care provider about your risks for falling. Tell your health care provider if: You fall. Be sure to tell your health care provider about all falls, even ones that seem minor. You feel dizzy, tiredness (fatigue), or off-balance. Take over-the-counter and prescription medicines only as told by your health care provider. These include supplements. Eat a healthy diet and maintain a healthy weight. A healthy diet includes low-fat dairy products, low-fat (lean) meats, and fiber from whole grains, beans, and lots of fruits and vegetables. Stay current with your vaccines. Schedule regular health, dental, and eye exams. Summary Having a healthy lifestyle and getting preventive care can help to protect your health and wellness after age 46. Screening and testing are the best way to find a health problem early and help you avoid having a fall. Early diagnosis and treatment give you the best chance for managing medical conditions that are more common for people who are older than age 32. Falls are a major cause of broken bones and head injuries in people who are older than age 63. Take precautions to prevent a fall at home. Work with your health care provider to learn what changes you can make to improve your health and wellness and to prevent falls. This information is not intended to replace advice given to you by your health care provider. Make sure you discuss any questions you have with your health care provider. Document Revised: 02/10/2021 Document Reviewed: 02/10/2021 Elsevier Patient Education  2024 [employer].

## 2024-02-16 NOTE — Progress Notes (Signed)
 Subjective:  Patient ID: Derrick Becker, male    DOB: 1946/01/11  Age: 78 y.o. MRN: [id number]  CC:  Chief Complaint  Patient presents with   Annual Exam    Pt is here for annual exam Pt reports he has no concerns Pt reports he is FASTING    HPI [name] presents for Annual Exam.  Had annual wellness visit on March 11. No health changes.   PCP, me Sleep specialist, Dr. Omar Bibber, OSA on CPAP, recent visit with Colby Daub on 09/06/23, good compliance, good treatment of AHI.  1 year follow-up. Still working well - well rested.  Ophthalmology, Dr. Maris Sickle, visit in November 2024, open angle with borderline findings.  Age-related cataract, presence of intraocular lens.  Diabetic eye exam. Reports visit yesterday - Dr. Diedre Fox. Now on eye gtts for pressure in R eye.   Diabetes: With prior history of hyperglycemia, microalbuminuria treated with metformin  1000 mg twice daily, glipizide  10 mg twice daily.  Weight is improved with stable control of diabetes most recently on A1c in November 2024.  He is on ARB and statin.  Weight stable from his November visit. Home readings fasting 99 this am, sometimes higher.  No med side effects.  Postprandial: none No symptomatic lows Highest 220 depending on diet.  Microalbumin: Normal ratio 08/18/2023 Optho, foot exam, pneumovax: Up-to-date  Diabetic Foot Exam - Simple   Simple Foot Form Diabetic Foot exam was performed with the following findings: Yes 02/16/2024  7:49 AM  Visual Inspection No deformities, no ulcerations, no other skin breakdown bilaterally: Yes Sensation Testing Intact to touch and monofilament testing bilaterally: Yes Pulse Check Posterior Tibialis and Dorsalis pulse intact bilaterally: Yes Comments Pt reports tingling in his feet "sometimes"   Episodic tingling in feet. Declines meds/treatment as mild.   Wt Readings from Last 3 Encounters:  02/16/24 191 lb 6 oz (86.8 kg)  09/06/23 199 lb (90.3 kg)   08/18/23 191 lb 9.6 oz (86.9 kg)   Lab Results  Component Value Date   HGBA1C 6.8 (H) 08/18/2023   HGBA1C 6.6 (H) 02/04/2023   HGBA1C 7.2 (H) 07/30/2022   Lab Results  Component Value Date   MICROALBUR 1.8 08/18/2023   LDLCALC 97 08/18/2023   CREATININE 1.01 08/18/2023   Hypertension: Amlodipine  10 mg daily, losartan /HCTZ 100/12.5 mg daily. On CPAP above for OSA.  Home readings: 118-128/ 64-72.  No lightheadedness, or med side effects.  BP Readings from Last 3 Encounters:  02/16/24 (!) 120/54  09/06/23 136/84  08/18/23 124/66   Lab Results  Component Value Date   CREATININE 1.01 08/18/2023   Hyperlipidemia: Lipitor 80 mg daily without new myalgias Lab Results  Component Value Date   CHOL 147 08/18/2023   HDL 35.20 (L) 08/18/2023   LDLCALC 97 08/18/2023   TRIG 73.0 08/18/2023   CHOLHDL 4 08/18/2023   Lab Results  Component Value Date   ALT 14 08/18/2023   AST 20 08/18/2023   ALKPHOS 50 08/18/2023   BILITOT 0.5 08/18/2023   Gout: Last flare: None recent Daily meds: Allopurinol  100 mg daily.  Borderline uric acid level in November. Prn med: Colchicine  if needed Lab Results  Component Value Date   LABURIC 6.2 08/18/2023       02/16/2024    7:49 AM 12/14/2023    8:20 AM 08/18/2023    9:17 AM 08/18/2023    9:11 AM 02/04/2023    8:33 AM  Depression screen PHQ 2/9  Decreased Interest 0  0 0 0 0  Down, Depressed, Hopeless 0 0 0 0 0  PHQ - 2 Score 0 0 0 0 0  Altered sleeping 0 0 0 0 0  Tired, decreased energy 0 0 0 0 0  Change in appetite 0 0 0 0 0  Feeling bad or failure about yourself  0 0 0 0 0  Trouble concentrating 0 0 0 0 0  Moving slowly or fidgety/restless 0 0 0 0 0  Suicidal thoughts 0 0 0 0 0  PHQ-9 Score 0 0 0 0 0  Difficult doing work/chores  Not difficult at all   Not difficult at all    Colonoscopy 09/23/2015.  Initial report recommended repeating in 5 years.  He is now over 75. He will check to see if due for repeat - number provided.    Health Maintenance  Topic Date Due   COVID-19 Vaccine (5 - 2024-25 season) 09/01/2023   HEMOGLOBIN A1C  02/15/2024   INFLUENZA VACCINE  05/05/2024   OPHTHALMOLOGY EXAM  08/16/2024   Diabetic kidney evaluation - eGFR measurement  08/17/2024   Diabetic kidney evaluation - Urine ACR  08/17/2024   Medicare Annual Wellness (AWV)  12/13/2024   FOOT EXAM  02/15/2025   Colonoscopy  09/22/2025   DTaP/Tdap/Td (2 - Td or Tdap) 07/06/2033   Pneumonia Vaccine 32+ Years old  Completed   Hepatitis C Screening  Completed   HPV VACCINES  Aged Out   Meningococcal B Vaccine  Aged Out   Zoster Vaccines- Shingrix  Discontinued   Lab Results  Component Value Date   PSA1 1.6 02/02/2017      Immunization History  Administered Date(s) Administered   Fluad Quad(high Dose 65+) 03/18/2022   Influenza Split 08/23/2012   Influenza, High Dose Seasonal PF 07/12/2018, 07/19/2019   Influenza,inj,Quad PF,6+ Mos 09/06/2017   Influenza-Unspecified 05/28/2021, 06/20/2021, 07/07/2023   Moderna Sars-Covid-2 Vaccination 12/15/2019, 01/15/2020, 08/14/2020, 07/07/2023   Pneumococcal Conjugate-13 02/02/2017   Pneumococcal Polysaccharide-23 03/14/2018   Tdap 07/07/2023   Zoster Recombinant(Shingrix) 03/18/2022  Second shingrix at pharmacy.  Covid booster in 07/2023. CDC recommendations discussed.   No results found. Optho yesterday.   Dental:  appt in September, starting back. Few years since last visit.   Alcohol: rare.   Tobacco: none  Exercise: walking, yardwork.    History Patient Active Problem List   Diagnosis Date Noted   History of colonic polyps 02/15/2024   Sebaceous cyst 03/13/2019   Hyperlipidemia 10/12/2013   Diabetes (HCC) 08/23/2012   HTN (hypertension) 08/23/2012   Past Medical History:  Diagnosis Date   Diabetes mellitus without complication (HCC)    Hyperlipemia    Hypertension    Past Surgical History:  Procedure Laterality Date   FRACTURE SURGERY     R forearm    Allergies  Allergen Reactions   No Known Allergies    Prior to Admission medications   Medication Sig Start Date End Date Taking? Authorizing Provider  allopurinol  (ZYLOPRIM ) 100 MG tablet TAKE 1 TABLET EVERY DAY 07/12/23  Yes Benjiman Bras, MD  amLODipine  (NORVASC ) 10 MG tablet TAKE 1 TABLET EVERY DAY 07/12/23  Yes Benjiman Bras, MD  aspirin EC 81 MG tablet Take 81 mg by mouth daily.   Yes [provider]  atorvastatin  (LIPITOR) 80 MG tablet TAKE 1 TABLET EVERY DAY 07/12/23  Yes Benjiman Bras, MD  colchicine  0.6 MG tablet 2 tablets once by mouth if gout flare, 1 additional tablet in 1 hour if not  improved. 07/30/22  Yes Benjiman Bras, MD  FLUZONE HIGH-DOSE QUADRIVALENT 0.7 ML SUSY  06/18/22  Yes [provider]  glipiZIDE  (GLUCOTROL ) 10 MG tablet TAKE 1 TABLET IN THE MORNING WITH MORNING MEAL AND 1 TABLET WITH EVENING MEAL 08/18/23  Yes Benjiman Bras, MD  losartan -hydrochlorothiazide (HYZAAR) 100-12.5 MG tablet Take 1 tablet by mouth daily. 08/18/23  Yes Benjiman Bras, MD  metFORMIN  (GLUCOPHAGE ) 1000 MG tablet TAKE 1 TABLET TWICE DAILY WITH MEALS 07/12/23  Yes Benjiman Bras, MD  Multiple Vitamins-Minerals (ONE DAILY MENS PO)  03/02/18  Yes [provider]  [facility] injection  04/27/22  Yes [provider]  METFORMIN  & DIET MANAGE PROD PO Metformin     [provider]   Social History   Socioeconomic History   Marital status: Divorced    Spouse name: Not on file   Number of children: 2   Years of education: College   Highest education level: Not on file  Occupational History   Not on file  Tobacco Use   Smoking status: Never   Smokeless tobacco: Never  Substance and Sexual Activity   Alcohol use: Yes    Comment: occ   Drug use: No   Sexual activity: Never    Birth control/protection: Abstinence  Other Topics Concern   Not on file  Social History Narrative   Not on file   Social Drivers of Health    Financial Resource Strain: Low Risk  (12/14/2023)   Overall Financial Resource Strain (CARDIA)    Difficulty of Paying Living Expenses: Not very hard  Food Insecurity: No Food Insecurity (12/14/2023)   Hunger Vital Sign    Worried About Running Out of Food in the Last Year: Never true    Ran Out of Food in the Last Year: Never true  Transportation Needs: No Transportation Needs (12/14/2023)   PRAPARE - Administrator, Civil Service (Medical): No    Lack of Transportation (Non-Medical): No  Physical Activity: Inactive (12/14/2023)   Exercise Vital Sign    Days of Exercise per Week: 0 days    Minutes of Exercise per Session: 0 min  Stress: No Stress Concern Present (12/14/2023)   [employer] of Occupational Health - Occupational Stress Questionnaire    Feeling of Stress : Not at all  Social Connections: Socially Isolated (12/14/2023)   Social Connection and Isolation Panel [NHANES]    Frequency of Communication with Friends and Family: More than three times a week    Frequency of Social Gatherings with Friends and Family: Twice a week    Attends Religious Services: Never    Database administrator or Organizations: No    Attends Banker Meetings: Never    Marital Status: Divorced  Catering manager Violence: Not At Risk (12/14/2023)   Humiliation, Afraid, Rape, and Kick questionnaire    Fear of Current or Ex-Partner: No    Emotionally Abused: No    Physically Abused: No    Sexually Abused: No    Review of Systems 13 point review of systems per patient health survey noted.  Negative other than as indicated above or in HPI.    Objective:   Vitals:   02/16/24 0750  BP: (!) 120/54  Pulse: 77  Temp: 97.9 F (36.6 C)  SpO2: 99%  Weight: 191 lb 6 oz (86.8 kg)  Height: 5\' 6"  (1.676 m)     Physical Exam Vitals reviewed.  Constitutional:      Appearance:  He is well-developed.  HENT:     Head: Normocephalic and atraumatic.     Right Ear: External  ear normal.     Left Ear: External ear normal.  Eyes:     Conjunctiva/sclera: Conjunctivae normal.     Pupils: Pupils are equal, round, and reactive to light.  Neck:     Thyroid: No thyromegaly.  Cardiovascular:     Rate and Rhythm: Normal rate and regular rhythm.     Heart sounds: Normal heart sounds.  Pulmonary:     Effort: Pulmonary effort is normal. No respiratory distress.     Breath sounds: Normal breath sounds. No wheezing.  Abdominal:     General: There is no distension.     Palpations: Abdomen is soft.     Tenderness: There is no abdominal tenderness.  Musculoskeletal:        General: No tenderness. Normal range of motion.     Cervical back: Normal range of motion and neck supple.  Lymphadenopathy:     Cervical: No cervical adenopathy.  Skin:    General: Skin is warm and dry.  Neurological:     Mental Status: He is alert and oriented to person, place, and time.     Deep Tendon Reflexes: Reflexes are normal and symmetric.  Psychiatric:        Behavior: Behavior normal.        Assessment & Plan:  JADIEL FARB is a 78 y.o. male . Annual physical exam  - -anticipatory guidance as below in AVS, screening labs above. Health maintenance items as above in HPI discussed/recommended as applicable.   - Appears to have been due for repeat colonoscopy, but now over age 67.  Asked that he contact his gastroenterologist, number provided, to determine if repeat colonoscopy needed 1 more time.  Type 2 diabetes mellitus with hyperglycemia, without long-term current use of insulin (HCC) - Plan: Hemoglobin A1c  - Overall stable A1c previously.  Will continue same regimen for now, check labs above and adjust plan accordingly.  Some intermittent dysesthesias in feet, may have component of diabetic neuropathy but mild, intermittent symptoms at this time and declines treatment.  RTC precautions.  47-month follow-up.  Hyperlipidemia, unspecified hyperlipidemia type - Plan: Comprehensive  metabolic panel with GFR, Lipid panel  - Tolerating statin current dose, check labs and adjust plan accordingly  Gout, unspecified cause, unspecified chronicity, unspecified site  - Borderline uric acid level in November but no recent flares, continue allopurinol  same dose.  Essential hypertension - Plan: Comprehensive metabolic panel with GFR  - Stable with borderline low diastolic but asymptomatic.  Continue to monitor at home with RTC precautions but no med changes for now.  No orders of the defined types were placed in this encounter.  Patient Instructions  I do not see a colonoscopy since 2016, and at that time they recommended repeat test in 5 years. Please call their office (Dr. Tova Fresh is the gastroenterologist who performed your last test) to see if you are due for repeat: Address: [address], [place], [place] [address] Phone: [phone]  No med changes today, if any lab concerns I will let you know. Tingling in feet may be due to diabetes. If that is more persistent, let me know as there are med options to treat this.   Health Maintenance After Age 44 After age 73, you are at a higher risk for certain long-term diseases and infections as well as injuries from falls. Falls are a major cause  of broken bones and head injuries in people who are older than age 51. Getting regular preventive care can help to keep you healthy and well. Preventive care includes getting regular testing and making lifestyle changes as recommended by your health care provider. Talk with your health care provider about: Which screenings and tests you should have. A screening is a test that checks for a disease when you have no symptoms. A diet and exercise plan that is right for you. What should I know about screenings and tests to prevent falls? Screening and testing are the best ways to find a health problem early. Early diagnosis and treatment give you the best chance of managing medical conditions  that are common after age 78. Certain conditions and lifestyle choices may make you more likely to have a fall. Your health care provider may recommend: Regular vision checks. Poor vision and conditions such as cataracts can make you more likely to have a fall. If you wear glasses, make sure to get your prescription updated if your vision changes. Medicine review. Work with your health care provider to regularly review all of the medicines you are taking, including over-the-counter medicines. Ask your health care provider about any side effects that may make you more likely to have a fall. Tell your health care provider if any medicines that you take make you feel dizzy or sleepy. Strength and balance checks. Your health care provider may recommend certain tests to check your strength and balance while standing, walking, or changing positions. Foot health exam. Foot pain and numbness, as well as not wearing proper footwear, can make you more likely to have a fall. Screenings, including: Osteoporosis screening. Osteoporosis is a condition that causes the bones to get weaker and break more easily. Blood pressure screening. Blood pressure changes and medicines to control blood pressure can make you feel dizzy. Depression screening. You may be more likely to have a fall if you have a fear of falling, feel depressed, or feel unable to do activities that you used to do. Alcohol use screening. Using too much alcohol can affect your balance and may make you more likely to have a fall. Follow these instructions at home: Lifestyle Do not drink alcohol if: Your health care provider tells you not to drink. If you drink alcohol: Limit how much you have to: 0-1 drink a day for women. 0-2 drinks a day for men. Know how much alcohol is in your drink. In the [place] one drink equals one 12 oz bottle of beer (355 mL), one 5 oz glass of wine (148 mL), or one 1 oz glass of hard liquor (44 mL). Do not use any products  that contain nicotine or tobacco. These products include cigarettes, chewing tobacco, and vaping devices, such as e-cigarettes. If you need help quitting, ask your health care provider. Activity  Follow a regular exercise program to stay fit. This will help you maintain your balance. Ask your health care provider what types of exercise are appropriate for you. If you need a cane or walker, use it as recommended by your health care provider. Wear supportive shoes that have nonskid soles. Safety  Remove any tripping hazards, such as rugs, cords, and clutter. Install safety equipment such as grab bars in bathrooms and safety rails on stairs. Keep rooms and walkways well-lit. General instructions Talk with your health care provider about your risks for falling. Tell your health care provider if: You fall. Be sure to tell your health care provider  about all falls, even ones that seem minor. You feel dizzy, tiredness (fatigue), or off-balance. Take over-the-counter and prescription medicines only as told by your health care provider. These include supplements. Eat a healthy diet and maintain a healthy weight. A healthy diet includes low-fat dairy products, low-fat (lean) meats, and fiber from whole grains, beans, and lots of fruits and vegetables. Stay current with your vaccines. Schedule regular health, dental, and eye exams. Summary Having a healthy lifestyle and getting preventive care can help to protect your health and wellness after age 12. Screening and testing are the best way to find a health problem early and help you avoid having a fall. Early diagnosis and treatment give you the best chance for managing medical conditions that are more common for people who are older than age 68. Falls are a major cause of broken bones and head injuries in people who are older than age 54. Take precautions to prevent a fall at home. Work with your health care provider to learn what changes you can make  to improve your health and wellness and to prevent falls. This information is not intended to replace advice given to you by your health care provider. Make sure you discuss any questions you have with your health care provider. Document Revised: 02/10/2021 Document Reviewed: 02/10/2021 Elsevier Patient Education  2024 [employer]         Signed,   Caro Christmas, MD [facility], [facility] 02/16/24 8:34 AM

## 2024-02-21 ENCOUNTER — Ambulatory Visit: Payer: Self-pay | Admitting: Family Medicine

## 2024-04-05 DIAGNOSIS — H40013 Open angle with borderline findings, low risk, bilateral: Secondary | ICD-10-CM | POA: Diagnosis not present

## 2024-04-20 ENCOUNTER — Encounter: Payer: Self-pay | Admitting: Family Medicine

## 2024-04-20 ENCOUNTER — Ambulatory Visit: Admitting: Family Medicine

## 2024-04-20 VITALS — BP 118/52 | HR 82 | Temp 98.2°F | Ht 66.0 in | Wt 190.0 lb

## 2024-04-20 DIAGNOSIS — R197 Diarrhea, unspecified: Secondary | ICD-10-CM

## 2024-04-20 DIAGNOSIS — E1165 Type 2 diabetes mellitus with hyperglycemia: Secondary | ICD-10-CM | POA: Diagnosis not present

## 2024-04-20 DIAGNOSIS — Z7984 Long term (current) use of oral hypoglycemic drugs: Secondary | ICD-10-CM | POA: Diagnosis not present

## 2024-04-20 LAB — CBC
HCT: 40 % (ref 39.0–52.0)
Hemoglobin: 13.3 g/dL (ref 13.0–17.0)
MCHC: 33.2 g/dL (ref 30.0–36.0)
MCV: 97 fl (ref 78.0–100.0)
Platelets: 240 K/uL (ref 150.0–400.0)
RBC: 4.13 Mil/uL — ABNORMAL LOW (ref 4.22–5.81)
RDW: 13.9 % (ref 11.5–15.5)
WBC: 7.6 K/uL (ref 4.0–10.5)

## 2024-04-20 LAB — MICROALBUMIN / CREATININE URINE RATIO
Creatinine,U: 196.9 mg/dL
Microalb Creat Ratio: 21.3 mg/g (ref 0.0–30.0)
Microalb, Ur: 4.2 mg/dL — ABNORMAL HIGH (ref 0.0–1.9)

## 2024-04-20 LAB — BASIC METABOLIC PANEL WITH GFR
BUN: 29 mg/dL — ABNORMAL HIGH (ref 6–23)
CO2: 23 meq/L (ref 19–32)
Calcium: 10 mg/dL (ref 8.4–10.5)
Chloride: 103 meq/L (ref 96–112)
Creatinine, Ser: 1.27 mg/dL (ref 0.40–1.50)
GFR: 54.19 mL/min — ABNORMAL LOW (ref >60.00)
Glucose, Bld: 155 mg/dL — ABNORMAL HIGH (ref 70–99)
Potassium: 4.4 meq/L (ref 3.5–5.1)
Sodium: 137 meq/L (ref 135–145)

## 2024-04-20 NOTE — Progress Notes (Signed)
 Subjective:  Patient ID: Derrick Becker, male    DOB: May 01, 1946  Age: 78 y.o. MRN: [id number]  CC:  Chief Complaint  Patient presents with   Diarrhea    Pt is here with C/O of diarrhea for 2-3 weeks. OTC- Imodium     HPI Derrick Becker presents for   Diarrhea: Noted past 2 to 3 weeks. Watery and loose stool. No blood in stool. 1-2 episodes per day. No fever. No abd pain, not cramping. Increased gas at times. Some fatigue with walking. No chest pain.  No new supplements, or new meds.  Drinking fluids. Sprite zero- same amoutn. Splenda in coffee - same. No new or increased use of sugar free foods/supplements.  More popcorn.  Able to eat/drink normally. No n/v.   Tx: immodium once - helped for a few days, then returned.   No recent foreign travel, no known sick contacts, no recent hospitalization or antibiotics.  He is on metformin  for diabetes but no recent changes in doses.   History Patient Active Problem List   Diagnosis Date Noted   History of colonic polyps 02/15/2024   Sebaceous cyst 03/13/2019   Hyperlipidemia 10/12/2013   Diabetes (HCC) 08/23/2012   HTN (hypertension) 08/23/2012   Past Medical History:  Diagnosis Date   Diabetes mellitus without complication (HCC)    Hyperlipemia    Hypertension    Past Surgical History:  Procedure Laterality Date   FRACTURE SURGERY     R forearm   Allergies  Allergen Reactions   No Known Allergies    Prior to Admission medications   Medication Sig Start Date End Date Taking? Authorizing Provider  allopurinol  (ZYLOPRIM ) 100 MG tablet TAKE 1 TABLET EVERY DAY 07/12/23   Levora Reyes SAUNDERS, MD  amLODipine  (NORVASC ) 10 MG tablet TAKE 1 TABLET EVERY DAY 07/12/23   Levora Reyes SAUNDERS, MD  aspirin EC 81 MG tablet Take 81 mg by mouth daily.    [provider]  atorvastatin  (LIPITOR) 80 MG tablet TAKE 1 TABLET EVERY DAY 07/12/23   Levora Reyes SAUNDERS, MD  colchicine  0.6 MG tablet 2 tablets once by mouth if gout flare, 1  additional tablet in 1 hour if not improved. 07/30/22   Levora Reyes SAUNDERS, MD  FLUZONE HIGH-DOSE QUADRIVALENT 0.7 ML SUSY  06/18/22   [provider]  glipiZIDE  (GLUCOTROL ) 10 MG tablet TAKE 1 TABLET IN THE MORNING WITH MORNING MEAL AND 1 TABLET WITH EVENING MEAL 08/18/23   Levora Reyes SAUNDERS, MD  losartan -hydrochlorothiazide (HYZAAR) 100-12.5 MG tablet Take 1 tablet by mouth daily. 08/18/23   Levora Reyes SAUNDERS, MD  METFORMIN  & DIET MANAGE PROD PO Metformin     [provider]  metFORMIN  (GLUCOPHAGE ) 1000 MG tablet TAKE 1 TABLET TWICE DAILY WITH MEALS 07/12/23   Levora Reyes SAUNDERS, MD  Multiple Vitamins-Minerals (ONE DAILY MENS PO)  03/02/18   [provider]  [facility] injection  04/27/22   [provider]   Social History   Socioeconomic History   Marital status: Divorced    Spouse name: Not on file   Number of children: 2   Years of education: College   Highest education level: Not on file  Occupational History   Not on file  Tobacco Use   Smoking status: Never   Smokeless tobacco: Never  Substance and Sexual Activity   Alcohol use: Yes    Comment: occ   Drug use: No   Sexual activity: Never    Birth control/protection: Abstinence  Other Topics Concern   Not on file  Social History Narrative   Not on file   Social Drivers of Health   Financial Resource Strain: Low Risk  (12/14/2023)   Overall Financial Resource Strain (CARDIA)    Difficulty of Paying Living Expenses: Not very hard  Food Insecurity: No Food Insecurity (12/14/2023)   Hunger Vital Sign    Worried About Running Out of Food in the Last Year: Never true    Ran Out of Food in the Last Year: Never true  Transportation Needs: No Transportation Needs (12/14/2023)   PRAPARE - Administrator, Civil Service (Medical): No    Lack of Transportation (Non-Medical): No  Physical Activity: Inactive (12/14/2023)   Exercise Vital Sign    Days of Exercise per Week: 0 days    Minutes of  Exercise per Session: 0 min  Stress: No Stress Concern Present (12/14/2023)   [employer] of Occupational Health - Occupational Stress Questionnaire    Feeling of Stress : Not at all  Social Connections: Socially Isolated (12/14/2023)   Social Connection and Isolation Panel    Frequency of Communication with Friends and Family: More than three times a week    Frequency of Social Gatherings with Friends and Family: Twice a week    Attends Religious Services: Never    Database administrator or Organizations: No    Attends Banker Meetings: Never    Marital Status: Divorced  Catering manager Violence: Not At Risk (12/14/2023)   Humiliation, Afraid, Rape, and Kick questionnaire    Fear of Current or Ex-Partner: No    Emotionally Abused: No    Physically Abused: No    Sexually Abused: No    Review of Systems   Objective:   Vitals:   04/20/24 1119  BP: (!) 118/52  Pulse: 82  Temp: 98.2 F (36.8 C)  SpO2: 94%  Weight: 190 lb (86.2 kg)  Height: 5' 6 (1.676 m)     Physical Exam Vitals reviewed.  Constitutional:      General: He is not in acute distress.    Appearance: Normal appearance. He is well-developed. He is not ill-appearing, toxic-appearing or diaphoretic.  HENT:     Head: Normocephalic and atraumatic.  Neck:     Vascular: No carotid bruit or JVD.  Cardiovascular:     Rate and Rhythm: Normal rate and regular rhythm.     Heart sounds: Normal heart sounds. No murmur heard. Pulmonary:     Effort: Pulmonary effort is normal.     Breath sounds: Normal breath sounds. No rales.  Abdominal:     General: Abdomen is flat. Bowel sounds are normal. There is no distension.     Palpations: There is no mass.     Tenderness: There is no abdominal tenderness. There is no guarding or rebound.     Hernia: No hernia is present.  Musculoskeletal:     Right lower leg: No edema.     Left lower leg: No edema.  Skin:    General: Skin is warm and dry.   Neurological:     Mental Status: He is alert and oriented to person, place, and time.  Psychiatric:        Mood and Affect: Mood normal.        Assessment & Plan:  Derrick Becker is a 78 y.o. male . Diarrhea, unspecified type - Plan: Basic metabolic panel with GFR, CBC, GI Profile, Stool, PCR  Type 2  diabetes mellitus with hyperglycemia, without long-term current use of insulin (HCC) - Plan: Urine Microalbumin w/creat. ratio, Basic metabolic panel with GFR Few episodes per day of diarrhea but now persistent past few weeks.  He is on metformin , some sugar-free foods as above but no recent changes.  Less likely primary cause.  Will check GI stool profile to evaluate for infectious diarrhea, specifically E. coli, Salmonella, and question of Giardia given overall mild symptoms but persistent symptoms.  No new meds for now.  Will also check blood count, electrolytes given chronicity of diarrhea.  Urine microalbumin with history of diabetes.  Handout given with RTC precautions given.  No orders of the defined types were placed in this encounter.  Patient Instructions  See information below about diarrhea.  I will order some testing to see if there may be a secondary infection causing the diarrhea given how long this has been going on.  Sometimes the medicines you take can contribute to diarrhea as well as some of the sugar-free foods or supplements, but lets look at the testing first.  If you have multiple episodes a day you can take an Imodium if needed.  Continue to stay hydrated.  If any concerns on labs I will let you know.  Return to the clinic or go to the nearest emergency room if any of your symptoms worsen or new symptoms occur.   Diarrhea, Adult Diarrhea is frequent loose and sometimes watery bowel movements. Diarrhea can make you feel weak and cause you to become dehydrated. Dehydration is a condition in which there is not enough water or other fluids in the body. Dehydration can  make you tired and thirsty, cause you to have a dry mouth, and decrease how often you urinate. Diarrhea typically lasts 2-3 days. However, it can last longer if it is a sign of something more serious. It is important to treat your diarrhea as told by your health care provider. Follow these instructions at home: Eating and drinking     Follow these recommendations as told by your health care provider: Take an oral rehydration solution (ORS). This is an over-the-counter medicine that helps return your body to its normal balance of nutrients and water. It is found at pharmacies and retail stores. Drink enough fluid to keep your urine pale yellow. Drink fluids such as water, diluted fruit juice, and low-calorie sports drinks. You can drink milk also, if desired. Sucking on ice chips is another way to get fluids. Avoid drinking fluids that contain a lot of sugar or caffeine, such as soda, energy drinks, and regular sports drinks. Avoid alcohol. Eat bland, easy-to-digest foods in small amounts as you are able. These foods include bananas, applesauce, rice, lean meats, toast, and crackers. Avoid spicy or fatty foods.  Medicines Take over-the-counter and prescription medicines only as told by your health care provider. If you were prescribed antibiotics, take them as told by your health care provider. Do not stop using the antibiotic even if you start to feel better. General instructions  Wash your hands often using soap and water for at least 20 seconds. If soap and water are not available, use hand sanitizer. Others in the household should wash their hands as well. Hands should be washed: After using the toilet or changing a diaper. Before preparing, cooking, or serving food. While caring for a sick person or while visiting someone in a hospital. Rest at home while you recover. Take a warm bath to relieve any burning or pain from  frequent diarrhea episodes. Watch your condition for any  changes. Contact a health care provider if: You have a fever. Your diarrhea gets worse. You have new symptoms. You vomit every time you eat or drink. You feel light-headed, dizzy, or have a headache. You have muscle cramps. You have signs of dehydration, such as: Dark urine, very little urine, or no urine. Cracked lips. Dry mouth. Sunken eyes. Sleepiness. Weakness. You have bloody or black stools or stools that look like tar. You have severe pain, cramping, or bloating in your abdomen. Your skin feels cold and clammy. You feel confused. Get help right away if: You have chest pain or your heart is beating very quickly. You have trouble breathing or you are breathing very quickly. You feel extremely weak or you faint. These symptoms may be an emergency. Get help right away. Call 911. Do not wait to see if the symptoms will go away. Do not drive yourself to the hospital. This information is not intended to replace advice given to you by your health care provider. Make sure you discuss any questions you have with your health care provider. Document Revised: 03/10/2022 Document Reviewed: 03/10/2022 Elsevier Patient Education  2024 Elsevier Inc.    Signed,   Reyes Pines, MD [facility], [facility] 04/20/24 11:52 AM

## 2024-04-20 NOTE — Patient Instructions (Addendum)
 See information below about diarrhea.  I will order some testing to see if there may be a secondary infection causing the diarrhea given how long this has been going on.  Sometimes the medicines you take can contribute to diarrhea as well as some of the sugar-free foods or supplements, but lets look at the testing first.  If you have multiple episodes a day you can take an Imodium if needed.  Continue to stay hydrated.  If any concerns on labs I will let you know.  Return to the clinic or go to the nearest emergency room if any of your symptoms worsen or new symptoms occur.   Diarrhea, Adult Diarrhea is frequent loose and sometimes watery bowel movements. Diarrhea can make you feel weak and cause you to become dehydrated. Dehydration is a condition in which there is not enough water or other fluids in the body. Dehydration can make you tired and thirsty, cause you to have a dry mouth, and decrease how often you urinate. Diarrhea typically lasts 2-3 days. However, it can last longer if it is a sign of something more serious. It is important to treat your diarrhea as told by your health care provider. Follow these instructions at home: Eating and drinking     Follow these recommendations as told by your health care provider: Take an oral rehydration solution (ORS). This is an over-the-counter medicine that helps return your body to its normal balance of nutrients and water. It is found at pharmacies and retail stores. Drink enough fluid to keep your urine pale yellow. Drink fluids such as water, diluted fruit juice, and low-calorie sports drinks. You can drink milk also, if desired. Sucking on ice chips is another way to get fluids. Avoid drinking fluids that contain a lot of sugar or caffeine, such as soda, energy drinks, and regular sports drinks. Avoid alcohol. Eat bland, easy-to-digest foods in small amounts as you are able. These foods include bananas, applesauce, rice, lean meats, toast, and  crackers. Avoid spicy or fatty foods.  Medicines Take over-the-counter and prescription medicines only as told by your health care provider. If you were prescribed antibiotics, take them as told by your health care provider. Do not stop using the antibiotic even if you start to feel better. General instructions  Wash your hands often using soap and water for at least 20 seconds. If soap and water are not available, use hand sanitizer. Others in the household should wash their hands as well. Hands should be washed: After using the toilet or changing a diaper. Before preparing, cooking, or serving food. While caring for a sick person or while visiting someone in a hospital. Rest at home while you recover. Take a warm bath to relieve any burning or pain from frequent diarrhea episodes. Watch your condition for any changes. Contact a health care provider if: You have a fever. Your diarrhea gets worse. You have new symptoms. You vomit every time you eat or drink. You feel light-headed, dizzy, or have a headache. You have muscle cramps. You have signs of dehydration, such as: Dark urine, very little urine, or no urine. Cracked lips. Dry mouth. Sunken eyes. Sleepiness. Weakness. You have bloody or black stools or stools that look like tar. You have severe pain, cramping, or bloating in your abdomen. Your skin feels cold and clammy. You feel confused. Get help right away if: You have chest pain or your heart is beating very quickly. You have trouble breathing or you are breathing very  quickly. You feel extremely weak or you faint. These symptoms may be an emergency. Get help right away. Call 911. Do not wait to see if the symptoms will go away. Do not drive yourself to the hospital. This information is not intended to replace advice given to you by your health care provider. Make sure you discuss any questions you have with your health care provider. Document Revised: 03/10/2022  Document Reviewed: 03/10/2022 Elsevier Patient Education  2024 [employer].

## 2024-04-22 ENCOUNTER — Ambulatory Visit: Payer: Self-pay | Admitting: Family Medicine

## 2024-04-22 LAB — GI PROFILE, STOOL, PCR

## 2024-05-12 DIAGNOSIS — E119 Type 2 diabetes mellitus without complications: Secondary | ICD-10-CM | POA: Diagnosis not present

## 2024-05-19 ENCOUNTER — Other Ambulatory Visit: Payer: Self-pay | Admitting: Family Medicine

## 2024-05-19 DIAGNOSIS — I10 Essential (primary) hypertension: Secondary | ICD-10-CM

## 2024-05-19 DIAGNOSIS — E785 Hyperlipidemia, unspecified: Secondary | ICD-10-CM

## 2024-05-19 DIAGNOSIS — E1165 Type 2 diabetes mellitus with hyperglycemia: Secondary | ICD-10-CM

## 2024-05-19 DIAGNOSIS — M1A0790 Idiopathic chronic gout, unspecified ankle and foot, without tophus (tophi): Secondary | ICD-10-CM

## 2024-05-31 ENCOUNTER — Other Ambulatory Visit: Payer: Self-pay | Admitting: Family Medicine

## 2024-05-31 DIAGNOSIS — E1165 Type 2 diabetes mellitus with hyperglycemia: Secondary | ICD-10-CM

## 2024-06-22 DIAGNOSIS — E119 Type 2 diabetes mellitus without complications: Secondary | ICD-10-CM | POA: Diagnosis not present

## 2024-06-23 LAB — LAB REPORT - SCANNED
Albumin, Urine POC: 7.1
Albumin/Creatinine Ratio, Urine, POC: 62
Creatinine, POC: 115 mg/dL

## 2024-07-22 DIAGNOSIS — Z809 Family history of malignant neoplasm, unspecified: Secondary | ICD-10-CM | POA: Diagnosis not present

## 2024-07-22 DIAGNOSIS — E1136 Type 2 diabetes mellitus with diabetic cataract: Secondary | ICD-10-CM | POA: Diagnosis not present

## 2024-07-22 DIAGNOSIS — E785 Hyperlipidemia, unspecified: Secondary | ICD-10-CM | POA: Diagnosis not present

## 2024-07-22 DIAGNOSIS — Z8249 Family history of ischemic heart disease and other diseases of the circulatory system: Secondary | ICD-10-CM | POA: Diagnosis not present

## 2024-07-22 DIAGNOSIS — M109 Gout, unspecified: Secondary | ICD-10-CM | POA: Diagnosis not present

## 2024-07-22 DIAGNOSIS — Z7982 Long term (current) use of aspirin: Secondary | ICD-10-CM | POA: Diagnosis not present

## 2024-07-22 DIAGNOSIS — I10 Essential (primary) hypertension: Secondary | ICD-10-CM | POA: Diagnosis not present

## 2024-07-22 DIAGNOSIS — E669 Obesity, unspecified: Secondary | ICD-10-CM | POA: Diagnosis not present

## 2024-07-22 DIAGNOSIS — H409 Unspecified glaucoma: Secondary | ICD-10-CM | POA: Diagnosis not present

## 2024-08-12 ENCOUNTER — Other Ambulatory Visit: Payer: Self-pay | Admitting: Family Medicine

## 2024-08-12 DIAGNOSIS — I10 Essential (primary) hypertension: Secondary | ICD-10-CM

## 2024-08-18 ENCOUNTER — Ambulatory Visit: Admitting: Family Medicine

## 2024-08-18 ENCOUNTER — Encounter: Payer: Self-pay | Admitting: Family Medicine

## 2024-08-18 VITALS — BP 134/72 | HR 83 | Temp 97.9°F | Resp 18 | Ht 66.0 in | Wt 187.8 lb

## 2024-08-18 DIAGNOSIS — E785 Hyperlipidemia, unspecified: Secondary | ICD-10-CM | POA: Diagnosis not present

## 2024-08-18 DIAGNOSIS — I10 Essential (primary) hypertension: Secondary | ICD-10-CM | POA: Diagnosis not present

## 2024-08-18 DIAGNOSIS — R197 Diarrhea, unspecified: Secondary | ICD-10-CM | POA: Diagnosis not present

## 2024-08-18 DIAGNOSIS — Z7984 Long term (current) use of oral hypoglycemic drugs: Secondary | ICD-10-CM

## 2024-08-18 DIAGNOSIS — E1165 Type 2 diabetes mellitus with hyperglycemia: Secondary | ICD-10-CM | POA: Diagnosis not present

## 2024-08-18 LAB — LIPID PANEL
Cholesterol: 132 mg/dL (ref 0–200)
HDL: 34.3 mg/dL — ABNORMAL LOW (ref >39.00)
LDL Cholesterol: 87 mg/dL (ref 0–99)
NonHDL: 98.11
Total CHOL/HDL Ratio: 4
Triglycerides: 55 mg/dL (ref 0.0–149.0)
VLDL: 11 mg/dL (ref 0.0–40.0)

## 2024-08-18 LAB — COMPREHENSIVE METABOLIC PANEL WITH GFR
ALT: 15 U/L (ref 0–53)
AST: 22 U/L (ref 0–37)
Albumin: 4.6 g/dL (ref 3.5–5.2)
Alkaline Phosphatase: 44 U/L (ref 39–117)
BUN: 22 mg/dL (ref 6–23)
CO2: 27 meq/L (ref 19–32)
Calcium: 9.4 mg/dL (ref 8.4–10.5)
Chloride: 103 meq/L (ref 96–112)
Creatinine, Ser: 1 mg/dL (ref 0.40–1.50)
GFR: 72.03 mL/min (ref >60.00)
Glucose, Bld: 97 mg/dL (ref 70–99)
Potassium: 4.1 meq/L (ref 3.5–5.1)
Sodium: 139 meq/L (ref 135–145)
Total Bilirubin: 0.4 mg/dL (ref 0.2–1.2)
Total Protein: 7.2 g/dL (ref 6.0–8.3)

## 2024-08-18 MED ORDER — METFORMIN HCL ER 500 MG PO TB24: 2000.0000 mg | ORAL_TABLET | Freq: Every day | ORAL | 5 refills | Status: AC

## 2024-08-18 NOTE — Progress Notes (Signed)
 Subjective:  Patient ID: Derrick Becker, male    DOB: 09-07-1946  Age: 78 y.o. MRN: [id number]  CC:  Chief Complaint  Patient presents with   Medical Management of Chronic Issues    Pt is doing well, no concerns, pt is fasting     HPI Derrick Becker presents for    Diabetes: With history of hyperglycemia, microalbuminuria treated with metformin  1000 mg twice daily, glipizide  10 mg twice daily.  He is on ARB and statin.still some diarrhea, but less than earlier this summer.  Home readings fasting: 105-120 Postprandial: 170-180 No symptomatic lows.  Microalbumin: Normal ratio 04/21/23.  Optho, foot exam, pneumovax: utd.   Slight irritated skin behind left ear at times, has used lotion intermittently.  Lab Results  Component Value Date   HGBA1C 7.4 (H) 02/16/2024   HGBA1C 6.8 (H) 08/18/2023   HGBA1C 6.6 (H) 02/04/2023   Lab Results  Component Value Date   MICROALBUR 4.2 (H) 04/20/2024   LDLCALC 82 02/16/2024   CREATININE 1.27 04/20/2024   Hypertension: Amlodipine  10 mg daily, losartan /HCTZ 100/12.5 mg daily on OSA treatment with CPAP prior - discontinued and sleeping well. No se's with meds.  Home readings: 120-130/60-70's.  BP Readings from Last 3 Encounters:  08/18/24 134/72  04/20/24 (!) 118/52  02/16/24 (!) 120/54   Lab Results  Component Value Date   CREATININE 1.27 04/20/2024   Hyperlipidemia: Lipitor 80 mg daily without any myalgias or side effects.no myalgias.  Lab Results  Component Value Date   CHOL 132 02/16/2024   HDL 38.80 (L) 02/16/2024   LDLCALC 82 02/16/2024   TRIG 57.0 02/16/2024   CHOLHDL 3 02/16/2024   Lab Results  Component Value Date   ALT 15 02/16/2024   AST 20 02/16/2024   ALKPHOS 51 02/16/2024   BILITOT 0.5 02/16/2024   Gout: Last flare: No recent flares. Daily meds: Allopurinol  100 mg daily. Prn med: Has colchicine  if needed Lab Results  Component Value Date   LABURIC 6.2 08/18/2023       History Patient Active  Problem List   Diagnosis Date Noted   History of colonic polyps 02/15/2024   Sebaceous cyst 03/13/2019   Hyperlipidemia 10/12/2013   Diabetes (HCC) 08/23/2012   HTN (hypertension) 08/23/2012   Past Medical History:  Diagnosis Date   Diabetes mellitus without complication (HCC)    Hyperlipemia    Hypertension    Past Surgical History:  Procedure Laterality Date   FRACTURE SURGERY     R forearm   Allergies  Allergen Reactions   No Known Allergies    Prior to Admission medications   Medication Sig Start Date End Date Taking? Authorizing Provider  allopurinol  (ZYLOPRIM ) 100 MG tablet TAKE 1 TABLET EVERY DAY 05/19/24  Yes Levora Reyes SAUNDERS, MD  amLODipine  (NORVASC ) 10 MG tablet TAKE 1 TABLET EVERY DAY 05/19/24  Yes Levora Reyes SAUNDERS, MD  aspirin EC 81 MG tablet Take 81 mg by mouth daily.   Yes [provider]  atorvastatin  (LIPITOR) 80 MG tablet TAKE 1 TABLET EVERY DAY 05/19/24  Yes Levora Reyes SAUNDERS, MD  colchicine  0.6 MG tablet 2 tablets once by mouth if gout flare, 1 additional tablet in 1 hour if not improved. 07/30/22  Yes Levora Reyes SAUNDERS, MD  FLUZONE HIGH-DOSE QUADRIVALENT 0.7 ML SUSY  06/18/22  Yes [provider]  glipiZIDE  (GLUCOTROL ) 10 MG tablet TAKE 1 TABLET IN THE MORNING WITH MORNING MEAL AND 1 TABLET WITH EVENING MEAL 05/31/24  Yes Levora,  Reyes SAUNDERS, MD  losartan -hydrochlorothiazide (HYZAAR) 100-12.5 MG tablet TAKE 1 TABLET EVERY DAY 08/14/24  Yes Levora Reyes SAUNDERS, MD  METFORMIN  & DIET MANAGE PROD PO Metformin    Yes [provider]  metFORMIN  (GLUCOPHAGE ) 1000 MG tablet TAKE 1 TABLET TWICE DAILY WITH MEALS 05/19/24  Yes Levora Reyes SAUNDERS, MD  Multiple Vitamins-Minerals (ONE DAILY MENS PO)  03/02/18  Yes [provider]  [facility] injection  04/27/22  Yes [provider]   Social History   Socioeconomic History   Marital status: Divorced    Spouse name: Not on file   Number of children: 2   Years of education: College    Highest education level: Not on file  Occupational History   Not on file  Tobacco Use   Smoking status: Never   Smokeless tobacco: Never  Substance and Sexual Activity   Alcohol use: Yes    Comment: occ   Drug use: No   Sexual activity: Never    Birth control/protection: Abstinence  Other Topics Concern   Not on file  Social History Narrative   Not on file   Social Drivers of Health   Financial Resource Strain: Low Risk  (12/14/2023)   Overall Financial Resource Strain (CARDIA)    Difficulty of Paying Living Expenses: Not very hard  Food Insecurity: No Food Insecurity (12/14/2023)   Hunger Vital Sign    Worried About Running Out of Food in the Last Year: Never true    Ran Out of Food in the Last Year: Never true  Transportation Needs: No Transportation Needs (12/14/2023)   PRAPARE - Administrator, Civil Service (Medical): No    Lack of Transportation (Non-Medical): No  Physical Activity: Inactive (12/14/2023)   Exercise Vital Sign    Days of Exercise per Week: 0 days    Minutes of Exercise per Session: 0 min  Stress: No Stress Concern Present (12/14/2023)   [employer] of Occupational Health - Occupational Stress Questionnaire    Feeling of Stress : Not at all  Social Connections: Socially Isolated (12/14/2023)   Social Connection and Isolation Panel    Frequency of Communication with Friends and Family: More than three times a week    Frequency of Social Gatherings with Friends and Family: Twice a week    Attends Religious Services: Never    Database Administrator or Organizations: No    Attends Banker Meetings: Never    Marital Status: Divorced  Catering Manager Violence: Not At Risk (12/14/2023)   Humiliation, Afraid, Rape, and Kick questionnaire    Fear of Current or Ex-Partner: No    Emotionally Abused: No    Physically Abused: No    Sexually Abused: No    Review of Systems  Constitutional:  Negative for fatigue and unexpected weight  change.  Eyes:  Negative for visual disturbance.  Respiratory:  Negative for cough, chest tightness and shortness of breath.   Cardiovascular:  Negative for chest pain, palpitations and leg swelling.  Gastrointestinal:  Negative for abdominal pain and blood in stool.  Neurological:  Negative for dizziness, light-headedness and headaches.     Objective:   Vitals:   08/18/24 0841  BP: 134/72  Pulse: 83  Resp: 18  Temp: 97.9 F (36.6 C)  TempSrc: Temporal  SpO2: 98%  Weight: 187 lb 12.8 oz (85.2 kg)  Height: 5' 6 (1.676 m)     Physical Exam Vitals reviewed.  Constitutional:      Appearance: He  is well-developed.  HENT:     Head: Normocephalic and atraumatic.     Ears:     Comments: No apparent lesion behind left ear, small amount of dry skin but no appreciable scale or lesions. Neck:     Vascular: No carotid bruit or JVD.  Cardiovascular:     Rate and Rhythm: Normal rate and regular rhythm.     Heart sounds: Normal heart sounds. No murmur heard. Pulmonary:     Effort: Pulmonary effort is normal.     Breath sounds: Normal breath sounds. No rales.  Musculoskeletal:     Right lower leg: No edema.     Left lower leg: No edema.  Skin:    General: Skin is warm and dry.  Neurological:     Mental Status: He is alert and oriented to person, place, and time.  Psychiatric:        Mood and Affect: Mood normal.        Assessment & Plan:  Derrick Becker is a 78 y.o. male . Type 2 diabetes mellitus with hyperglycemia, without long-term current use of insulin (HCC) - Plan: metFORMIN  (GLUCOPHAGE -XR) 500 MG 24 hr tablet, Hemoglobin A1c  - With some persistent diarrhea we will try extended release formulation of metformin .  If ineffective for diarrhea, follow-up to discuss other possible causes, or GI eval.  Check A1c and adjust plan accordingly for diabetic treatment.  Essential hypertension - Plan: Comprehensive metabolic panel with GFR  - Stable with current med regimen,  check labs and adjust plan accordingly.  Hyperlipidemia, unspecified hyperlipidemia type - Plan: Lipid panel  - Check lipids and adjust Lipitor dose accordingly, tolerating current med regimen.  Diarrhea, unspecified type  - As above, initial change to extended release metformin  with RTC precautions.  No appreciable skin lesion noted behind ear but recommended hydrating lotion initially with RTC precautions if not improving.  Question during skin dermatitis versus eczema.   Meds ordered this encounter  Medications   metFORMIN  (GLUCOPHAGE -XR) 500 MG 24 hr tablet    Sig: Take 4 tablets (2,000 mg total) by mouth daily.    Dispense:  120 tablet    Refill:  5   Patient Instructions  Thanks for coming in today.  Lets try the extended release formulation of metformin  to see if that lessens diarrhea.  Take all 4 pills at once once per day.  If any side effects with that medication or diarrhea does not improve, please return for recheck and we can discuss next steps including possible gastroenterology evaluation.   Apply lotion over-the-counter at least once or twice per day to the area behind your ear and if that does not improve, or any new lesions, please be seen. Thank you for coming in today. No other change in medications at this time. If there are any concerns on your bloodwork, I will let you know. Take care!       Signed,   Reyes Pines, MD [facility], [facility] 08/18/24 9:21 AM

## 2024-08-18 NOTE — Patient Instructions (Signed)
 Thanks for coming in today.  Lets try the extended release formulation of metformin  to see if that lessens diarrhea.  Take all 4 pills at once once per day.  If any side effects with that medication or diarrhea does not improve, please return for recheck and we can discuss next steps including possible gastroenterology evaluation.   Apply lotion over-the-counter at least once or twice per day to the area behind your ear and if that does not improve, or any new lesions, please be seen. Thank you for coming in today. No other change in medications at this time. If there are any concerns on your bloodwork, I will let you know. Take care!

## 2024-08-21 LAB — HEMOGLOBIN A1C: Hgb A1c MFr Bld: 7.1 % — ABNORMAL HIGH (ref 4.6–6.5)

## 2024-08-22 ENCOUNTER — Ambulatory Visit: Payer: Self-pay | Admitting: Family Medicine

## 2024-09-04 ENCOUNTER — Telehealth: Payer: Self-pay | Admitting: Adult Health

## 2024-09-04 NOTE — Telephone Encounter (Signed)
 Patient cancelled appointment due to no longer using the CPAP machine.

## 2024-09-07 ENCOUNTER — Ambulatory Visit: Payer: Medicare HMO | Admitting: Adult Health

## 2024-09-26 ENCOUNTER — Telehealth: Payer: Self-pay | Admitting: Family Medicine

## 2024-09-26 DIAGNOSIS — Z8669 Personal history of other diseases of the nervous system and sense organs: Secondary | ICD-10-CM

## 2024-09-26 NOTE — Addendum Note (Signed)
 Addended by: Naphtali Zywicki R on: 09/26/2024 05:37 PM   Modules accepted: Orders

## 2024-09-26 NOTE — Telephone Encounter (Signed)
 Cataract surgery scheduled for 10/10/24(advised by office that [facility] now requires referral)   Preference of office, provider, location: [facility], [address] [place], [place], [place] [address], [phone]

## 2024-09-26 NOTE — Telephone Encounter (Signed)
 Copied from CRM [id number]. Topic: Referral - Request for Referral >> Sep 26, 2024  1:08 PM Suzen RAMAN wrote: Did the patient discuss referral with their provider in the last year? Yes (If No - schedule appointment) (If Yes - send message)  Appointment offered? Yes  Type of order/referral and detailed reason for visit: ophthalmology; Cataract surgery scheduled for 10/10/24(advised by office that [facility] now requires referral)  Preference of office, provider, location: [facility], [address] [place], [place], [place] [address], [phone]  If referral order, have you been seen by this specialty before? No (If Yes, this issue or another issue? When? Where?  Can we respond through MyChart? Yes

## 2024-09-26 NOTE — Telephone Encounter (Signed)
 Referral placed.

## 2024-10-02 ENCOUNTER — Telehealth: Payer: Self-pay | Admitting: Family Medicine

## 2024-10-02 NOTE — Telephone Encounter (Signed)
 Patient came by office and needs eye referral to go to [facility] care at [address]  He states referral was sent to [facility] by mistake. He already has an appt scheduled with [facility] for 10/10/24 but office is needing referral.

## 2024-10-02 NOTE — Telephone Encounter (Signed)
 Patient is needing referral sent to [facility] - already has appointment but they still need a referral

## 2024-10-03 NOTE — Telephone Encounter (Signed)
 Noted, I do not think we need to do a new referral, hopefully we can just forward the previous referral, will advise referral staff.  Thanks.

## 2024-10-04 ENCOUNTER — Telehealth: Payer: Self-pay | Admitting: Family Medicine

## 2024-10-04 NOTE — Telephone Encounter (Signed)
 [facility] is saying they have not received anything as of yet.SABRASABRA

## 2024-10-04 NOTE — Telephone Encounter (Signed)
 Copied from CRM [id number]. Topic: Referral - Question >> Oct 04, 2024 10:34 AM Eva FALCON wrote: Reason for CRM: Pt was calling in and states [facility] advise for the referral to be submitted directly to his [employer]. Says [facility] has not received anything and his appointment is set for 10/10/24. Says if needed please call Samule at [facility] [phone]. States that referral needs to specify Cateract Surgery. Please call patient when done or any other questions [phone].

## 2024-11-20 ENCOUNTER — Ambulatory Visit: Admitting: Family Medicine

## 2024-12-19 ENCOUNTER — Encounter
